# Patient Record
Sex: Female | Born: 1985 | Race: Black or African American | Hispanic: No | Marital: Married | Smoking: Never smoker
Health system: Southern US, Community
[De-identification: ages and names within clinical notes are randomized; demographics above are authoritative.]

## PROBLEM LIST (undated history)

## (undated) ENCOUNTER — Inpatient Hospital Stay (HOSPITAL_COMMUNITY): Payer: Self-pay

## (undated) DIAGNOSIS — D649 Anemia, unspecified: Secondary | ICD-10-CM

## (undated) DIAGNOSIS — R6889 Other general symptoms and signs: Secondary | ICD-10-CM

## (undated) DIAGNOSIS — R21 Rash and other nonspecific skin eruption: Secondary | ICD-10-CM

## (undated) DIAGNOSIS — R5383 Other fatigue: Secondary | ICD-10-CM

## (undated) DIAGNOSIS — R61 Generalized hyperhidrosis: Secondary | ICD-10-CM

## (undated) DIAGNOSIS — D509 Iron deficiency anemia, unspecified: Secondary | ICD-10-CM

## (undated) DIAGNOSIS — R5382 Chronic fatigue, unspecified: Secondary | ICD-10-CM

## (undated) DIAGNOSIS — I499 Cardiac arrhythmia, unspecified: Secondary | ICD-10-CM

## (undated) DIAGNOSIS — F329 Major depressive disorder, single episode, unspecified: Secondary | ICD-10-CM

## (undated) DIAGNOSIS — O1420 HELLP syndrome (HELLP), unspecified trimester: Secondary | ICD-10-CM

## (undated) DIAGNOSIS — F32A Depression, unspecified: Secondary | ICD-10-CM

## (undated) HISTORY — PX: WISDOM TOOTH EXTRACTION: SHX21

## (undated) HISTORY — DX: HELLP syndrome (HELLP), unspecified trimester: O14.20

## (undated) HISTORY — DX: Major depressive disorder, single episode, unspecified: F32.9

## (undated) HISTORY — DX: Depression, unspecified: F32.A

## (undated) HISTORY — DX: Anemia, unspecified: D64.9

---

## 2004-12-22 ENCOUNTER — Emergency Department (HOSPITAL_COMMUNITY): Admission: EM | Admit: 2004-12-22 | Discharge: 2004-12-22 | Payer: Self-pay | Admitting: Emergency Medicine

## 2005-03-18 ENCOUNTER — Emergency Department (HOSPITAL_COMMUNITY): Admission: EM | Admit: 2005-03-18 | Discharge: 2005-03-18 | Payer: Self-pay | Admitting: Emergency Medicine

## 2005-03-25 ENCOUNTER — Emergency Department (HOSPITAL_COMMUNITY): Admission: EM | Admit: 2005-03-25 | Discharge: 2005-03-25 | Payer: Self-pay | Admitting: Emergency Medicine

## 2005-03-30 ENCOUNTER — Emergency Department (HOSPITAL_COMMUNITY): Admission: EM | Admit: 2005-03-30 | Discharge: 2005-03-30 | Payer: Self-pay | Admitting: Emergency Medicine

## 2005-07-13 ENCOUNTER — Encounter: Admission: RE | Admit: 2005-07-13 | Discharge: 2005-07-13 | Payer: Self-pay | Admitting: Gastroenterology

## 2007-01-15 ENCOUNTER — Emergency Department (HOSPITAL_COMMUNITY): Admission: EM | Admit: 2007-01-15 | Discharge: 2007-01-15 | Payer: Self-pay | Admitting: *Deleted

## 2007-07-28 ENCOUNTER — Inpatient Hospital Stay (HOSPITAL_COMMUNITY): Admission: AD | Admit: 2007-07-28 | Discharge: 2007-07-28 | Payer: Self-pay | Admitting: Obstetrics & Gynecology

## 2007-07-28 ENCOUNTER — Ambulatory Visit (HOSPITAL_COMMUNITY): Admission: RE | Admit: 2007-07-28 | Discharge: 2007-07-28 | Payer: Self-pay | Admitting: Obstetrics & Gynecology

## 2007-07-30 ENCOUNTER — Inpatient Hospital Stay (HOSPITAL_COMMUNITY): Admission: AD | Admit: 2007-07-30 | Discharge: 2007-08-06 | Payer: Self-pay | Admitting: Obstetrics & Gynecology

## 2007-07-31 ENCOUNTER — Encounter: Payer: Self-pay | Admitting: Obstetrics & Gynecology

## 2007-08-01 ENCOUNTER — Encounter: Payer: Self-pay | Admitting: Obstetrics & Gynecology

## 2007-08-07 ENCOUNTER — Inpatient Hospital Stay (HOSPITAL_COMMUNITY): Admission: AD | Admit: 2007-08-07 | Discharge: 2007-08-09 | Payer: Self-pay | Admitting: Obstetrics & Gynecology

## 2007-08-14 ENCOUNTER — Emergency Department (HOSPITAL_COMMUNITY): Admission: EM | Admit: 2007-08-14 | Discharge: 2007-08-14 | Payer: Self-pay | Admitting: Emergency Medicine

## 2007-11-22 ENCOUNTER — Emergency Department (HOSPITAL_COMMUNITY): Admission: EM | Admit: 2007-11-22 | Discharge: 2007-11-22 | Payer: Self-pay | Admitting: Emergency Medicine

## 2008-01-17 ENCOUNTER — Emergency Department (HOSPITAL_COMMUNITY): Admission: EM | Admit: 2008-01-17 | Discharge: 2008-01-17 | Payer: Self-pay | Admitting: Emergency Medicine

## 2008-02-09 ENCOUNTER — Ambulatory Visit: Payer: Self-pay | Admitting: Family Medicine

## 2008-02-17 ENCOUNTER — Emergency Department (HOSPITAL_COMMUNITY): Admission: EM | Admit: 2008-02-17 | Discharge: 2008-02-17 | Payer: Self-pay | Admitting: Emergency Medicine

## 2008-03-08 ENCOUNTER — Other Ambulatory Visit: Admission: RE | Admit: 2008-03-08 | Discharge: 2008-03-08 | Payer: Self-pay | Admitting: Family Medicine

## 2008-03-08 ENCOUNTER — Ambulatory Visit: Payer: Self-pay | Admitting: Family Medicine

## 2008-03-08 ENCOUNTER — Encounter: Payer: Self-pay | Admitting: Family Medicine

## 2008-03-09 ENCOUNTER — Emergency Department (HOSPITAL_COMMUNITY): Admission: EM | Admit: 2008-03-09 | Discharge: 2008-03-09 | Payer: Self-pay | Admitting: Family Medicine

## 2008-03-11 ENCOUNTER — Emergency Department (HOSPITAL_COMMUNITY): Admission: EM | Admit: 2008-03-11 | Discharge: 2008-03-11 | Payer: Self-pay | Admitting: Emergency Medicine

## 2008-04-11 ENCOUNTER — Ambulatory Visit: Payer: Self-pay | Admitting: Family Medicine

## 2009-03-08 ENCOUNTER — Emergency Department (HOSPITAL_COMMUNITY): Admission: EM | Admit: 2009-03-08 | Discharge: 2009-03-08 | Payer: Self-pay | Admitting: Family Medicine

## 2009-07-19 ENCOUNTER — Emergency Department (HOSPITAL_COMMUNITY): Admission: EM | Admit: 2009-07-19 | Discharge: 2009-07-20 | Payer: Self-pay | Admitting: Emergency Medicine

## 2009-10-08 ENCOUNTER — Emergency Department (HOSPITAL_COMMUNITY): Admission: EM | Admit: 2009-10-08 | Discharge: 2009-10-08 | Payer: Self-pay | Admitting: Emergency Medicine

## 2010-02-09 ENCOUNTER — Emergency Department (HOSPITAL_COMMUNITY): Admission: EM | Admit: 2010-02-09 | Discharge: 2010-02-09 | Payer: Self-pay | Admitting: Emergency Medicine

## 2010-10-05 ENCOUNTER — Encounter: Payer: Self-pay | Admitting: Obstetrics & Gynecology

## 2010-12-01 LAB — URINALYSIS, ROUTINE W REFLEX MICROSCOPIC
Bilirubin Urine: NEGATIVE
Glucose, UA: NEGATIVE mg/dL
Hgb urine dipstick: NEGATIVE
Ketones, ur: NEGATIVE mg/dL
Nitrite: NEGATIVE
Protein, ur: NEGATIVE mg/dL
Specific Gravity, Urine: 1.024 (ref 1.005–1.030)
Urobilinogen, UA: 0.2 mg/dL (ref 0.0–1.0)
pH: 6 (ref 5.0–8.0)

## 2010-12-01 LAB — DIFFERENTIAL
Basophils Absolute: 0 10*3/uL (ref 0.0–0.1)
Basophils Relative: 0 % (ref 0–1)
Eosinophils Absolute: 0.1 10*3/uL (ref 0.0–0.7)
Eosinophils Relative: 1 % (ref 0–5)
Lymphocytes Relative: 26 % (ref 12–46)
Lymphs Abs: 1.5 10*3/uL (ref 0.7–4.0)
Monocytes Absolute: 0.6 10*3/uL (ref 0.1–1.0)
Monocytes Relative: 10 % (ref 3–12)
Neutro Abs: 3.7 10*3/uL (ref 1.7–7.7)
Neutrophils Relative %: 63 % (ref 43–77)

## 2010-12-01 LAB — COMPREHENSIVE METABOLIC PANEL
ALT: 14 U/L (ref 0–35)
AST: 16 U/L (ref 0–37)
Albumin: 3.9 g/dL (ref 3.5–5.2)
Alkaline Phosphatase: 57 U/L (ref 39–117)
BUN: 15 mg/dL (ref 6–23)
CO2: 27 mEq/L (ref 19–32)
Calcium: 9.4 mg/dL (ref 8.4–10.5)
Chloride: 107 mEq/L (ref 96–112)
Creatinine, Ser: 0.9 mg/dL (ref 0.4–1.2)
GFR calc Af Amer: >60 mL/min (ref >60)
GFR calc non Af Amer: >60 mL/min (ref >60)
Glucose, Bld: 92 mg/dL (ref 70–99)
Potassium: 4 mEq/L (ref 3.5–5.1)
Sodium: 140 mEq/L (ref 135–145)
Total Bilirubin: 0.5 mg/dL (ref 0.3–1.2)
Total Protein: 7.3 g/dL (ref 6.0–8.3)

## 2010-12-01 LAB — CBC
HCT: 35.7 % — ABNORMAL LOW (ref 36.0–46.0)
Hemoglobin: 11.8 g/dL — ABNORMAL LOW (ref 12.0–15.0)
MCHC: 33 g/dL (ref 30.0–36.0)
MCV: 86 fL (ref 78.0–100.0)
Platelets: 158 10*3/uL (ref 150–400)
RBC: 4.15 MIL/uL (ref 3.87–5.11)
RDW: 13.6 % (ref 11.5–15.5)
WBC: 5.9 10*3/uL (ref 4.0–10.5)

## 2010-12-01 LAB — LIPASE, BLOOD: Lipase: 26 U/L (ref 11–59)

## 2010-12-01 LAB — POCT PREGNANCY, URINE: Preg Test, Ur: NEGATIVE

## 2010-12-22 LAB — POCT URINALYSIS DIP (DEVICE)
Bilirubin Urine: NEGATIVE
Glucose, UA: NEGATIVE mg/dL
Hgb urine dipstick: NEGATIVE
Ketones, ur: NEGATIVE mg/dL
Nitrite: NEGATIVE
Protein, ur: NEGATIVE mg/dL
Specific Gravity, Urine: 1.025 (ref 1.005–1.030)
Urobilinogen, UA: 0.2 mg/dL (ref 0.0–1.0)
pH: 5.5 (ref 5.0–8.0)

## 2010-12-22 LAB — WET PREP, GENITAL
Clue Cells Wet Prep HPF POC: NONE SEEN
Trich, Wet Prep: NONE SEEN

## 2010-12-22 LAB — GC/CHLAMYDIA PROBE AMP, GENITAL
Chlamydia, DNA Probe: NEGATIVE
GC Probe Amp, Genital: NEGATIVE

## 2010-12-22 LAB — POCT PREGNANCY, URINE: Preg Test, Ur: NEGATIVE

## 2011-01-27 ENCOUNTER — Encounter: Payer: Self-pay | Admitting: Internal Medicine

## 2011-01-27 ENCOUNTER — Ambulatory Visit (INDEPENDENT_AMBULATORY_CARE_PROVIDER_SITE_OTHER): Payer: 59 | Admitting: Internal Medicine

## 2011-01-27 ENCOUNTER — Telehealth: Payer: Self-pay | Admitting: Gastroenterology

## 2011-01-27 VITALS — BP 108/76 | HR 60 | Ht 68.0 in | Wt 171.2 lb

## 2011-01-27 DIAGNOSIS — K5909 Other constipation: Secondary | ICD-10-CM

## 2011-01-27 DIAGNOSIS — K5900 Constipation, unspecified: Secondary | ICD-10-CM

## 2011-01-27 DIAGNOSIS — D649 Anemia, unspecified: Secondary | ICD-10-CM

## 2011-01-27 MED ORDER — FERROUS FUMARATE 325 (106 FE) MG PO TABS: 1.0000 | ORAL_TABLET | Freq: Every day | ORAL | Status: DC

## 2011-01-27 NOTE — Discharge Summary (Signed)
Cheryl [name], Cheryl [name]             ACCOUNT NO.:  [id number]   MEDICAL RECORD NO.:  [id number]          PATIENT TYPE:  INP   LOCATION:  9309                          [facility]ILITY:  [facility]   PHYSICIAN:  Charles A. 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  Much improved after bedrest, intravenous hydration and supportive  management.  Discharged home in good condition.   REASON FOR ADMISSION:  A 25 year old black female with history of HELLP  syndrome the previous week prior to her readmission and underwent  induction of labor at [redacted] weeks gestation, and delivered a nonviable  fetus.  The patient was discharged home on the Saturday prior to her  readmission.  On the day of her presentation, noted the onset of  increased dizziness and headache.  Headache was not relieved by Dilaudid  which she was prescribed.   PAST MEDICAL HISTORY/ILLNESSES:  None.   MEDICATIONS:  1. Prenatal vitamins.  2. Hydrochlorothiazide.  3. Dilaudid.   ALLERGIES:  PENICILLIN.   SOCIAL HISTORY:  Single.  Lives in a homeless-type accommodation at the  [facility].  Negative history of tobacco, alcohol or recreational  drug use.   FAMILY HISTORY:  Positive for coronary artery disease, diabetes,  hypertension and stroke.   PHYSICAL EXAMINATION:  GENERAL:  Well-nourished, well-developed female  in no acute distress.  VITAL SIGNS:  Temperature was 99.6, pulse 111, respiratory rate 20,  blood pressure initially was 142/101.  Repeat blood pressures were  136/85.  HEENT:  Within normal limits.  LUNGS:   Clear to auscultation bilaterally.  HEART:  Regular rate and rhythm.  ABDOMEN:  Nontender.  PELVIC:  Exam was omitted   IMPRESSION:  1. Status post HELLP syndrome, resolved.  2. New onset of headache and dizziness.   PLAN:  Admit for observation and supportive management.   HOSPITAL COURSE:  The patient was admitted and placed on bedrest, IV  fluid hydration and analgesic management.  She responded well to  therapy.  CT scan of the head was done on hospital day number 1 and  findings were negative for evidence of hemorrhage or any other  intracranial abnormalities.  A social work consult was obtained because  of the patient's home situation and discharge planning has arranged for  the patient's return to the [facility] and for social services  follow up.  Psychiatry consultation was also obtained by Dr. Jeanie Sewer  and the patient was started on Celexa 10 mg p.o. daily and Ambien 5-10  mg p.o. nightly for insomnia.  The patient will be followed up with one  of the psychiatric outpatient clinics that is available.  The patient  was ready for discharge on hospital day number 2  and follow up  instructions were given.   LABORATORY DATA:  Hemoglobin 11, hematocrit 32, white blood cell count  8600, platelets 183,000, SGOT 42, SGPT 86, LDH 299, uric acid was 6.3,  basic metabolic panel was all within normal limits.   DIAGNOSTICS:  CT scan of the head was negative.   FOLLOW UP:  The patient was given instructions for followup in the  office on August 10, 2007.      Charles A. Clearance Coots, M.D.  Electronically Signed     CAH/MEDQ  D:  08/09/2007  T:  08/09/2007  Job:  [id number]

## 2011-01-27 NOTE — Patient Instructions (Signed)
Take Miralax every day. Increase the fiber in your diet, High fiber diet info handout given. We will follow up with Urgent Care about your labs.

## 2011-01-27 NOTE — Telephone Encounter (Signed)
Patient informed. 

## 2011-01-27 NOTE — Assessment & Plan Note (Addendum)
This sounds like a functional disturbance. She is anemic but there no other worrisome features. She will try to increase fiber and use the MiraLax daily. I will have her follow up in 2 months just to make sure everything is okay and I will review the labs from urgent care. Hypothyroidism is possible but seems unlikely. TSH was normal 01/22/11 - at 1.591

## 2011-01-27 NOTE — Consult Note (Signed)
Cheryl [name], Cheryl [name] NO.:  [id number]   MEDICAL RECORD NO.:  [id number]         PATIENT TYPE:  WINP   LOCATION:                                [facility]  [facility]   PHYSICIAN:  Antonietta Breach, M.D.  DATE OF BIRTH:  28-Mar-1986   DATE OF CONSULTATION:  08/08/2007  DATE OF DISCHARGE:                                 CONSULTATION   REASON FOR CONSULTATION:  Depression.   REQUESTING PHYSICIAN:  Dr. Antionette Char.   HISTORY OF PRESENT ILLNESS:  [name] is a 25 year old female admitted  to the [facility] on November 23, after continuing to  have difficulty with HELLP syndrome.   The patient lost her 26-month-old baby 2 weeks ago due to HELLP  syndrome.   Since that time, the patient had been experiencing depressed mood, low  energy, anhedonia, difficulty concentrating and insomnia.  She states  that her appetite is adequate.  She is not having any thoughts of  harming herself or others.  She is not having any hallucinations or  delusion.   The patient has taken Ambien for the insomnia successfully.   PAST PSYCHIATRIC HISTORY:  The patient has no history of prior major  depression.  She does acknowledge that she has a history of excessive  worry.  She does have periodic feeling on edge, muscle tension.  She has  no history of prior psychotropic medication or mental health care.   FAMILY PSYCHIATRIC HISTORY:  None known.   SOCIAL HISTORY:  The patient is single.  She was working at [employer] third shift, but lost her job a few months ago when her car  broke down.  She does have a number of family members who live in the  area, however, she has not been living with any of them.  She does  described them as very supportive and part of her support system.  The  patient also describes a strong religious faith that gives her critical  support.  She does not use any alcohol or illegal drugs.   The father of the deceased baby has also  been grieving the loss of the  child, as the patient has; however, the father the baby also was  unfaithful to the patient when he was visiting family in [place].  The the patient broke off the serious tie with the father of the baby  after he was unfaithful.  She has wanted to talk about the whole of  their experience, including the death of the baby, however, the father  the baby has not wanted to talk about it.  He does want to stay in touch  with the patient, and they have been talking regularly.  As mentioned  above, at this point, the patient is classifying herself as homeless.  The social worker is helping with support on this case in order to help  the patient find her resources.   PAST MEDICAL HISTORY:  Please see above.  The patient is postpartum 2  weeks.   ALLERGIES:  PENICILLIN.   MEDICATIONS:  MAR is reviewed.  Psychotropics  include Ambien 10 mg  q.h.s. p.r.n.   LABORATORY DATA:  SGOT 42, SGPT 86.  WBC 8.6, hemoglobin 11.4, platelet  count 183.  Head CT without contrast showed no acute abnormalities.   REVIEW OF SYSTEMS:  CONSTITUTIONAL:  Afebrile.  No weight loss.  HEAD:  No trauma.  EYES:  No visual changes.  EARS:  No hearing impairment  NOSE:  No rhinorrhea.  MOUTH/THROAT:  No sore throat.  NEUROLOGIC:  No  focal motor or sensory changes.  PSYCHIATRIC:  As above.  CARDIOVASCULAR:  No chest pain, palpitations.  RESPIRATORY:  No coughing  or wheezing.  GASTROINTESTINAL:  No nausea, vomiting, diarrhea.  GENITOURINARY:  No dysuria.  SKIN:  Unremarkable.  ENDOCRINE/METABOLIC:  No heat or cold intolerance.  MUSCULOSKELETAL:  No deformities.  HEMATOLOGIC/LYMPHATIC:  Mild anemia.   EXAMINATION:  VITAL SIGNS:  Temperature 98.3, pulse 62.  Respiratory  rate 18, blood pressure 120/62, O2 saturation on room air 99%.  GENERAL  APPEARANCE:  [name] is a young female appearing her chronologic age,  partially reclined in a supine position in her hospital bed.  She has  no  abnormal involuntary movements.  OTHER MENTAL STATUS EXAM:  [name] is alert.  Her eye contact is  good.  Her attention span is within normal limits.  Her concentration is  mildly decreased.  She is oriented completely to all spheres.  Memory is  intact to immediate recent and remote.  Fund of knowledge and  intelligence are within normal limits.  Affect is constricted with  appropriate tears.  Mood is depressed.  Speech involves normal rate and  prosody.  The volume is mildly soft.  Thought process logical, coherent,  goal-directed.  No looseness of associations.  Language, expression and  comprehension are intact.  Abstraction intact.  Thought content, no  thoughts of harming herself, no thoughts of harming others, no  delusions, no hallucinations.  Insight is intact.  Judgment is intact.  The patient is socially appropriate and cooperative.   ASSESSMENT:  AXIS I:  293.83; mood disorder not otherwise specified, depressed (general  medical and functional elements).  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  296.23; major depressive disorder, single episode, severe.  AXIS II:  Deferred.  AXIS III:  See general medical section.  AXIS IV:  General medical, bereavement, economic, primary support group.  AXIS V:  55.   [name] is not at risk to harm herself or others.  She agrees to call  emergency services immediately for any thoughts of harming herself,  thoughts of harming others, or other psychiatric emergency symptoms.   The undersigned provided ego supportive psychotherapy and education.   The indications, alternatives and adverse effects of Celexa were  discussed with the patient for antidepression, antianxiety, as well as  Ambien for anti-insomnia.  The patient understands and would like to  proceed as below.   RECOMMENDATIONS:  1. Start Celexa 10 mg p.o. q. day, and then increase as tolerated to      20 mg p.o. q. day.  2. Ambien 5 mg to 10 mg p.o.  nightly p.r.n. insomnia/  3. Would ask the social worker to set this patient up with one of the      clinics of psychiatry attached to [facility] or      [facility].  The other option would be the county mental      health center.  The patient will require psychotropic medication  management, as well as psychotherapy.      Antonietta Breach, M.D.  Electronically Signed     JW/MEDQ  D:  08/08/2007  T:  08/09/2007  Job:  [id number]

## 2011-01-27 NOTE — Progress Notes (Signed)
  Subjective:    Patient ID: Cheryl [name], female    DOB: [birth year], 25 y.o.   MRN: [id number]  HPI 25-year-old [place] American woman with a one to two-year history of constipation. She describes an urge to defecate but an inability to movement. She has small balls of stool that once or twice a week with incomplete defecation. Her boyfriend's mom is a Engineer, civil (consulting) and told her about MiraLax and when she took it every day she had good results and much less bloating and abdominal distention. She stopped that because the label indicated that it was not for chronic use. She is using that intermittently at this time. She has been to urgent care for evaluation and recently had labs that urgent medical and family care and was told she was anemic but other labs were okay. We are requesting these results. She was told to use iron but she notes that that constipates her when she takes it.  There is no rectal bleeding, no unexplained weight loss, or change in caliber of the stools. She did not have problems like this in childhood though she had an abdominal pain workup with negative MRI, ultrasound and upper GI small bowel follow-through in the past several years.    Review of Systems Positive for fatigue, says her menstrual periods are regular but not heavy. She has some insomnia, allergies and some anxiety issues. All other systems are negative or as above per history of present illness.    Objective:   Physical Exam  Constitutional: She is oriented to person, place, and time. She appears well-developed and well-nourished.  HENT:  Head: Normocephalic and atraumatic.  Mouth/Throat: Oropharynx is clear and moist.  Eyes: Conjunctivae are normal. Pupils are equal, round, and reactive to light.  Neck: Normal range of motion. Neck supple. No JVD present. No thyromegaly present.  Cardiovascular: Normal rate, regular rhythm and normal heart sounds.  Exam reveals no gallop and no friction rub.   No murmur  heard. Pulmonary/Chest: Effort normal and breath sounds normal.  Abdominal: Soft. Bowel sounds are normal. She exhibits no distension. There is tenderness. There is no rebound and no guarding.       Mild left lower quadrant tenderness without mass  Genitourinary:       Rectal exam with female staff present shows a normal anoderm. In a week is present. Digital exam shows normal resting tone. There is a good voluntary squeeze. There is some decrease in the center of the rectum with Valsalva, there is good abdominal pressure with that. No stool in the rectal vault. No masses.  Musculoskeletal: She exhibits no edema.  Lymphadenopathy:    She has no cervical adenopathy.  Neurological: She is alert and oriented to person, place, and time.  Skin: Skin is warm and dry.  Psychiatric: She has a normal mood and affect.          Assessment & Plan:

## 2011-01-27 NOTE — Assessment & Plan Note (Addendum)
Do not have the lab data that at this time. We'll review that and make further recommendations. She was told to use iron but this constipates her. She could need an anemia panel to understand better. Possible causes include menstrual blood loss, poor iron intake, B12 deficiency, much less likely would be gastrointestinal blood loss is there's no history of that. She could need Hemoccult testing to understand but the overall situation is consistent with a benign constipation issue Labs subsequently arrived to review, date of labs is 01/22/2011. Her hemoglobin was 11.1 with MCV 84.5. RDW 15.4. Iron saturation 8% with TIBC 360, which is in the middle of the normal range. White blood cells and platelets normal. We will call with these results, suspect she does have a mild iron deficiency, and some sort of iron supplementation would be appropriate but that has caused constipation in the past. Will suggest hemocyte or some other type of less constipating agent.

## 2011-01-27 NOTE — Telephone Encounter (Signed)
Message copied by Tedra Senegal on Tue Jan 27, 2011  4:17 PM ------      Message from: Stan Head      Created: Tue Jan 27, 2011  3:56 PM      Regarding: start hemocyte       Please call patient and tell her I reviewed labs from urgent care.      Agree that she needs some iron and I want her to try Hemocyte (ferrous fumarate). I placed it on her med list.      It is OTC so she should take one a day. Hopefully, it will not constipate her.

## 2011-01-30 NOTE — Discharge Summary (Signed)
Cheryl [name], Cheryl [name]             ACCOUNT NO.:  [id number]   MEDICAL RECORD NO.:  [id number]          PATIENT TYPE:  INP   LOCATION:  9318                          [facility]   PHYSICIAN:  Roseanna Rainbow, M.D.DATE OF BIRTH:  1986/01/05   DATE OF ADMISSION:  07/30/2007  DATE OF DISCHARGE:  08/06/2007                               DISCHARGE SUMMARY   CHIEF COMPLAINT:  The patient is a 25 year old with an intrauterine  pregnancy at 20+ weeks complaining of abdominal pain.   HISTORY OF PRESENT ILLNESS:  The patient gives a history of several days  of right upper quadrant and mid epigastric pain.  The pain is  characterized as dull, unassociated with meals and constant.  She has  had some nausea and vomiting.  She denies any history of similar  episodes.  No other symptoms, exposures or fevers.  There was initial  relief of the pain with Maalox.  She presented to the [facility] with similar complaints several days ago.  The  pain improved there.  The pain is improved by sitting forward.  She  denies any history of gastrointestinal pathology, i.e. peptic ulcer  disease, cholelithiasis.  She reports recent Tylenol usage with  adherence to appropriate dosing.   PAST MEDICAL HISTORY:  She denies.   PAST OB/GYN HISTORY:  Noncontributory.  Spontaneous abortion x 1.   PAST SURGICAL HISTORY:  Denies.   SOCIAL HISTORY:  No tobacco, ethanol or drug use.   ALLERGIES:  PENICILLIN.   MEDICATIONS:  Please see the reconciliation form.   PHYSICAL EXAMINATION:  VITAL SIGNS:  Stable, afebrile.  Blood pressure  130-140s/80s, fetal heart rate by Doppler okay.  The O2 sats 100% on  room air.  GENERAL:  Moderate distress, diaphoretic.  LUNGS:  Clear to auscultation bilaterally.  HEART:  Regular rate and rhythm.  ABDOMEN:  There is tenderness in the right upper quadrant.  No rebound  or guarding.  Nondistended.  Normoactive bowel sounds.  GU:  Sterile  vaginal exam per the R.N.  The cervix is long, closed and  posterior.   LABORATORY DATA:  Lipase 16, amylase 96, SGOT and SGPT 57 and 50, white  blood cell count 9.8, hemoglobin 12.4, platelets 103,000.   DIAGNOSTICS:  1. EKG:  Sinus bradycardia.  2. Abdominal ultrasound:  Normal.   ASSESSMENT:  Second trimester intrauterine pregnancy with epigastric  pain.  Differential diagnoses, gastroesophageal reflux disease,  dyspepsia, H. pylori, gastritis, viral hepatitis,  mild-moderate thrombopenia, questionable preeclampsia with elevated  blood pressures in the setting of pain.   PLAN:  Observation.  Proton pump inhibitor, Carafate.  Serial labs and  exams.   HOSPITAL COURSE:  The patient was admitted on July 31, 2007.  The  SGOT and SGPT were 148 and 109.  Uric acid was 4.7.  LDH was 522,  hemoglobin was 12.4 and platelets were 58,000.  At this point, it was  felt that she had developed HELLP syndrome.  A DIC panel and peripheral  smear were ordered as well as a manual platelet count, type and hold.  Maternal fetal  medicine consult was ordered as well as a 24-hour urine  for protein and creatinine.  Later that day, her platelet count was  30,000.  MRI of the abdomen demonstrated small ascites.  At this point,  the maternal fetal medicine specialist, Dr. Rachel Bo recommended delivery  for maternal indications.  It was reviewed with the patient that the  gestational age was in the pre-viabile realm. The risk of bleeding at  the time of delivery with the possibility of a blood transfusion was  also discussed.  At this point, high-dose Cytotec was administered  vaginally.  At approximately 5 a.m. on August 01, 2007 there was  delivery of the fetus and placenta.  Her labs at approximately midnight,  the LDH was 741, SGOT and SGPT were 304 and 195.  Hemoglobin was 12.1  and a platelet count was 25,000.  Her bleeding at this point was stable.  Please note that the patient had been started  on magnesium sulfate for  seizure prophylaxis.  On August 02, 2007, the liver transaminases were  trending downward.  However, her platelet count was 15,000.  Her blood  pressures remained stable.   At this point, a thrombophilia workup was performed.  On August 03, 2007, her platelet count was 27,000 and her liver transaminases  continued to trend down.  On August 04, 2007, hydrochlorothiazide was  started.  Her platelet count at this point was 36,000.  On August 05, 2007, her platelet count was 90,000.  The patient was complaining of  redness on the palms of her hands and soles of her feet, and  paresthesias.  Maternal fetal medicine was re-consulted and the etiology  of these complaints was unclear.  Hepatitis panel was negative.  The  prothrombin mutation was negative.  Protein C and S were normal.  Lupus  anticoagulant was negative.  Antithrombin III was normal.  Beta-2  microglobulin was normal.  Factor V Leiden was negative as well.  The  magnesium sulfate was discontinued.  She remained stable and was  discharged to home on August 06, 2007   DISCHARGE DIAGNOSIS:  Intrauterine pregnancy at 20 weeks with hemolysis,  elevated liver enzymes and low platelet count syndrome.   PROCEDURE:  Cytotec induction of labor.   CONDITION:  Stable.   DIET:  Regular.   ACTIVITY:  Pelvic rest, progressive activity.   MEDICATIONS:  Hydrochlorothiazide.   DISPOSITION:  The patient was to follow up in 2 days for blood pressure  check.     Roseanna Rainbow, M.D.  Electronically Signed    LAJ/MEDQ  D:  09/01/2007  T:  09/01/2007  Job:  [id number]

## 2011-02-10 ENCOUNTER — Encounter: Payer: Self-pay | Admitting: Internal Medicine

## 2011-02-25 ENCOUNTER — Telehealth: Payer: Self-pay | Admitting: Internal Medicine

## 2011-02-25 NOTE — Telephone Encounter (Signed)
Patient called about instructions given on 01/27/11. Patient was told that she need to be on iron supplement but the OTC medications constipate her and the pharmacist stated it may be better to get Rx Hemocyte. Patient question if she could get rx sent to [facility].

## 2011-02-27 ENCOUNTER — Emergency Department (HOSPITAL_COMMUNITY)
Admission: EM | Admit: 2011-02-27 | Discharge: 2011-02-27 | Disposition: A | Discharge status: Home / Self Care | Payer: No Typology Code available for payment source | Attending: Emergency Medicine | Admitting: Emergency Medicine

## 2011-02-27 ENCOUNTER — Emergency Department (HOSPITAL_COMMUNITY): Payer: No Typology Code available for payment source

## 2011-02-27 DIAGNOSIS — M542 Cervicalgia: Secondary | ICD-10-CM | POA: Insufficient documentation

## 2011-02-27 DIAGNOSIS — S139XXA Sprain of joints and ligaments of unspecified parts of neck, initial encounter: Secondary | ICD-10-CM | POA: Insufficient documentation

## 2011-02-27 DIAGNOSIS — M545 Low back pain, unspecified: Secondary | ICD-10-CM | POA: Insufficient documentation

## 2011-02-27 DIAGNOSIS — S335XXA Sprain of ligaments of lumbar spine, initial encounter: Secondary | ICD-10-CM | POA: Insufficient documentation

## 2011-02-27 MED ORDER — HEMOCYTE PLUS 106-1 MG PO CAPS: 1.0000 | ORAL_CAPSULE | Freq: Every day | ORAL | Status: DC

## 2011-02-27 NOTE — Telephone Encounter (Signed)
I prescribed Hemocyte - see if she is better with that Unless already done she needs a cbc in 6 weeks re iron-deficiency anemia

## 2011-03-02 ENCOUNTER — Emergency Department (HOSPITAL_COMMUNITY)
Admission: EM | Admit: 2011-03-02 | Discharge: 2011-03-02 | Disposition: A | Discharge status: Home / Self Care | Payer: No Typology Code available for payment source | Attending: Emergency Medicine | Admitting: Emergency Medicine

## 2011-03-02 DIAGNOSIS — S139XXA Sprain of joints and ligaments of unspecified parts of neck, initial encounter: Secondary | ICD-10-CM | POA: Insufficient documentation

## 2011-03-02 DIAGNOSIS — R51 Headache: Secondary | ICD-10-CM | POA: Insufficient documentation

## 2011-03-02 NOTE — Telephone Encounter (Signed)
Tried calling patient Friday 02/27/11, patient cell number will not receive incoming call. Also tried calling today 03/02/11, patient's cell number still not receiving incoming calls. Called patients work number and they don't have employee by that name. Called front desk to let them know that patients contact information needs to be updated.

## 2011-03-07 ENCOUNTER — Emergency Department (HOSPITAL_COMMUNITY)
Admission: EM | Admit: 2011-03-07 | Discharge: 2011-03-07 | Disposition: A | Discharge status: Home / Self Care | Payer: Self-pay | Attending: Emergency Medicine | Admitting: Emergency Medicine

## 2011-03-07 DIAGNOSIS — B3731 Acute candidiasis of vulva and vagina: Secondary | ICD-10-CM | POA: Insufficient documentation

## 2011-03-07 DIAGNOSIS — B373 Candidiasis of vulva and vagina: Secondary | ICD-10-CM | POA: Insufficient documentation

## 2011-03-07 LAB — WET PREP, GENITAL

## 2011-03-07 LAB — URINE MICROSCOPIC-ADD ON

## 2011-03-07 LAB — URINALYSIS, ROUTINE W REFLEX MICROSCOPIC
Nitrite: NEGATIVE
Specific Gravity, Urine: 1.027 (ref 1.005–1.030)
pH: 6 (ref 5.0–8.0)

## 2011-03-10 LAB — GC/CHLAMYDIA PROBE AMP, GENITAL: Chlamydia, DNA Probe: NEGATIVE

## 2011-06-08 LAB — URINALYSIS, ROUTINE W REFLEX MICROSCOPIC
Glucose, UA: NEGATIVE
Ketones, ur: NEGATIVE
Nitrite: NEGATIVE
Protein, ur: NEGATIVE
Urobilinogen, UA: 0.2

## 2011-06-08 LAB — POCT PREGNANCY, URINE
Operator id: 196461
Preg Test, Ur: NEGATIVE

## 2011-06-08 LAB — WET PREP, GENITAL: Yeast Wet Prep HPF POC: NONE SEEN

## 2011-06-08 LAB — GC/CHLAMYDIA PROBE AMP, GENITAL: GC Probe Amp, Genital: POSITIVE — AB

## 2011-06-10 ENCOUNTER — Emergency Department (HOSPITAL_COMMUNITY)
Admission: EM | Admit: 2011-06-10 | Discharge: 2011-06-11 | Disposition: A | Discharge status: Home / Self Care | Payer: Self-pay | Attending: Emergency Medicine | Admitting: Emergency Medicine

## 2011-06-10 DIAGNOSIS — J039 Acute tonsillitis, unspecified: Secondary | ICD-10-CM | POA: Insufficient documentation

## 2011-06-10 DIAGNOSIS — H6690 Otitis media, unspecified, unspecified ear: Secondary | ICD-10-CM | POA: Insufficient documentation

## 2011-06-10 DIAGNOSIS — H10029 Other mucopurulent conjunctivitis, unspecified eye: Secondary | ICD-10-CM | POA: Insufficient documentation

## 2011-06-11 LAB — COMPREHENSIVE METABOLIC PANEL
ALT: 9
Albumin: 3.8
Alkaline Phosphatase: 54
Chloride: 106
Glucose, Bld: 95
Potassium: 4.1
Sodium: 136
Total Bilirubin: 1
Total Protein: 7.3

## 2011-06-11 LAB — URINALYSIS, ROUTINE W REFLEX MICROSCOPIC
Bilirubin Urine: NEGATIVE
Glucose, UA: NEGATIVE
Hgb urine dipstick: NEGATIVE
Ketones, ur: NEGATIVE
pH: 6.5

## 2011-06-11 LAB — STOOL CULTURE

## 2011-06-11 LAB — CLOSTRIDIUM DIFFICILE EIA: C difficile Toxins A+B, EIA: NEGATIVE

## 2011-06-11 LAB — CBC
Hemoglobin: 12.6
RDW: 13.3
WBC: 5.4

## 2011-06-11 LAB — POCT RAPID STREP A: Streptococcus, Group A Screen (Direct): NEGATIVE

## 2011-06-11 LAB — DIFFERENTIAL
Basophils Relative: 0
Eosinophils Absolute: 0.1
Monocytes Absolute: 0.6
Monocytes Relative: 11

## 2011-06-11 LAB — OVA AND PARASITE EXAMINATION: Ova and parasites: NONE SEEN

## 2011-06-14 ENCOUNTER — Emergency Department (HOSPITAL_COMMUNITY)
Admission: EM | Admit: 2011-06-14 | Discharge: 2011-06-14 | Disposition: A | Discharge status: Home / Self Care | Payer: Self-pay | Attending: Emergency Medicine | Admitting: Emergency Medicine

## 2011-06-14 DIAGNOSIS — R6889 Other general symptoms and signs: Secondary | ICD-10-CM | POA: Insufficient documentation

## 2011-06-14 DIAGNOSIS — R51 Headache: Secondary | ICD-10-CM | POA: Insufficient documentation

## 2011-06-23 LAB — COMPREHENSIVE METABOLIC PANEL
ALT: 101 — ABNORMAL HIGH
ALT: 109 — ABNORMAL HIGH
ALT: 135 — ABNORMAL HIGH
ALT: 97 — ABNORMAL HIGH
AST: 160 — ABNORMAL HIGH
AST: 42 — ABNORMAL HIGH
AST: 56 — ABNORMAL HIGH
AST: 57 — ABNORMAL HIGH
AST: 94 — ABNORMAL HIGH
Albumin: 2.1 — ABNORMAL LOW
Albumin: 2.2 — ABNORMAL LOW
Albumin: 2.3 — ABNORMAL LOW
Albumin: 2.6 — ABNORMAL LOW
Albumin: 3.5
Alkaline Phosphatase: 54
Alkaline Phosphatase: 57
Alkaline Phosphatase: 62
Alkaline Phosphatase: 65
Alkaline Phosphatase: 65
Alkaline Phosphatase: 76
BUN: 1 — ABNORMAL LOW
BUN: 2 — ABNORMAL LOW
BUN: 3 — ABNORMAL LOW
BUN: 4 — ABNORMAL LOW
BUN: 5 — ABNORMAL LOW
BUN: 6
BUN: 9
CO2: 24
CO2: 25
CO2: 25
CO2: 27
CO2: 27
CO2: 27
Calcium: 6.6 — ABNORMAL LOW
Calcium: 6.9 — ABNORMAL LOW
Calcium: 6.9 — ABNORMAL LOW
Calcium: 7.1 — ABNORMAL LOW
Calcium: 7.3 — ABNORMAL LOW
Chloride: 102
Chloride: 104
Chloride: 105
Chloride: 105
Chloride: 106
Chloride: 107
Chloride: 107
Creatinine, Ser: 0.73
Creatinine, Ser: 0.74
Creatinine, Ser: 0.74
Creatinine, Ser: 0.77
Creatinine, Ser: 0.78
Creatinine, Ser: 0.78
Creatinine, Ser: 0.82
GFR calc Af Amer: >60
GFR calc Af Amer: >60
GFR calc Af Amer: >60
GFR calc Af Amer: >60
GFR calc Af Amer: >60
GFR calc Af Amer: >60
GFR calc Af Amer: >60
GFR calc non Af Amer: >60
GFR calc non Af Amer: >60
GFR calc non Af Amer: >60
GFR calc non Af Amer: >60
GFR calc non Af Amer: >60
GFR calc non Af Amer: >60
GFR calc non Af Amer: >60
Glucose, Bld: 101 — ABNORMAL HIGH
Glucose, Bld: 101 — ABNORMAL HIGH
Glucose, Bld: 77
Glucose, Bld: 82
Potassium: 3.5
Potassium: 3.5
Potassium: 3.6
Potassium: 3.6
Potassium: 3.7
Potassium: 3.9
Sodium: 137
Sodium: 137
Sodium: 138
Total Bilirubin: 0.8
Total Bilirubin: 0.8
Total Bilirubin: 0.8
Total Bilirubin: 0.9
Total Bilirubin: 1
Total Bilirubin: 1.2
Total Bilirubin: 1.4 — ABNORMAL HIGH
Total Protein: 4.8 — ABNORMAL LOW
Total Protein: 5.2 — ABNORMAL LOW
Total Protein: 5.8 — ABNORMAL LOW
Total Protein: 6.4
Total Protein: 7.4

## 2011-06-23 LAB — DIC (DISSEMINATED INTRAVASCULAR COAGULATION)PANEL
INR: 1.1
Platelets: 51 — ABNORMAL LOW
Smear Review: NONE SEEN

## 2011-06-23 LAB — CBC
HCT: 26.2 — ABNORMAL LOW
HCT: 26.3 — ABNORMAL LOW
HCT: 28 — ABNORMAL LOW
HCT: 28.9 — ABNORMAL LOW
HCT: 29.1 — ABNORMAL LOW
HCT: 30.4 — ABNORMAL LOW
HCT: 32.4 — ABNORMAL LOW
HCT: 33.2 — ABNORMAL LOW
HCT: 35.5 — ABNORMAL LOW
HCT: 35.5 — ABNORMAL LOW
Hemoglobin: 11.5 — ABNORMAL LOW
Hemoglobin: 12.1
Hemoglobin: 12.1
Hemoglobin: 12.4
Hemoglobin: 8.9 — ABNORMAL LOW
Hemoglobin: 9.1 — ABNORMAL LOW
MCHC: 34.6
MCHC: 34.8
MCHC: 34.8
MCHC: 34.9
MCHC: 35.1
MCHC: 35.2
MCHC: 35.3
MCV: 87.7
MCV: 88.4
MCV: 89.5
MCV: 89.6
MCV: 89.8
MCV: 89.9
MCV: 89.9
MCV: 90.3
MCV: 90.4
MCV: 90.6
Platelets: 15 — CL
Platelets: 183
Platelets: 19 — CL
Platelets: 19 — CL
Platelets: 63 — ABNORMAL LOW
Platelets: 83 — ABNORMAL LOW
Platelets: 90 — ABNORMAL LOW
RBC: 2.91 — ABNORMAL LOW
RBC: 2.93 — ABNORMAL LOW
RBC: 3.3 — ABNORMAL LOW
RBC: 3.44 — ABNORMAL LOW
RBC: 3.69 — ABNORMAL LOW
RBC: 3.9
RBC: 3.96
RBC: 3.97
RDW: 14.2
RDW: 14.3
RDW: 14.4
RDW: 14.5
RDW: 15.1
RDW: 15.2
RDW: 15.3
RDW: 15.5
RDW: 15.7 — ABNORMAL HIGH
WBC: 10.3
WBC: 6.7
WBC: 7.4
WBC: 7.6
WBC: 7.7
WBC: 8.6
WBC: 8.6
WBC: 9.1
WBC: 9.8

## 2011-06-23 LAB — URINE MICROSCOPIC-ADD ON

## 2011-06-23 LAB — PROTEIN, URINE, 24 HOUR
Protein, 24H Urine: 652 — ABNORMAL HIGH
Protein, Urine: 16
Urine Total Volume-UPROT: 4075

## 2011-06-23 LAB — URINALYSIS, ROUTINE W REFLEX MICROSCOPIC
Bilirubin Urine: NEGATIVE
Glucose, UA: 100 — AB
Glucose, UA: NEGATIVE
Ketones, ur: 15 — AB
Leukocytes, UA: NEGATIVE
Nitrite: NEGATIVE
Nitrite: NEGATIVE
Nitrite: NEGATIVE
Specific Gravity, Urine: 1.02
Specific Gravity, Urine: 1.02
Urobilinogen, UA: 0.2
pH: 6
pH: 7
pH: 6

## 2011-06-23 LAB — LACTATE DEHYDROGENASE
LDH: 345 — ABNORMAL HIGH
LDH: 407 — ABNORMAL HIGH
LDH: 554 — ABNORMAL HIGH
LDH: 700 — ABNORMAL HIGH

## 2011-06-23 LAB — CREATININE, SERUM
Creatinine, Ser: 0.71
Creatinine, Ser: 0.72
Creatinine, Ser: 0.87
Creatinine, Ser: 0.87
GFR calc Af Amer: >60
GFR calc non Af Amer: >60

## 2011-06-23 LAB — TYPE AND SCREEN
ABO/RH(D): O POS
Antibody Screen: NEGATIVE

## 2011-06-23 LAB — ALT
ALT: 184 — ABNORMAL HIGH
ALT: 195 — ABNORMAL HIGH
ALT: 92 — ABNORMAL HIGH

## 2011-06-23 LAB — HEPATITIS PANEL, ACUTE
Hep B C IgM: NEGATIVE
Hepatitis B Surface Ag: NEGATIVE

## 2011-06-23 LAB — SAMPLE TO BLOOD BANK

## 2011-06-23 LAB — PROTHROMBIN GENE MUTATION

## 2011-06-23 LAB — URINE CULTURE: Special Requests: NEGATIVE

## 2011-06-23 LAB — URIC ACID
Uric Acid, Serum: 4.7
Uric Acid, Serum: 4.8
Uric Acid, Serum: 5.4
Uric Acid, Serum: 5.4
Uric Acid, Serum: 5.6
Uric Acid, Serum: 6.3

## 2011-06-23 LAB — PROTEIN C, TOTAL: Protein C, Total: 53 % — ABNORMAL LOW (ref 70–140)

## 2011-06-23 LAB — PROTEIN S, TOTAL: Protein S Ag, Total: 61 % — ABNORMAL LOW (ref 70–140)

## 2011-06-23 LAB — RAPID URINE DRUG SCREEN, HOSP PERFORMED
Amphetamines: NOT DETECTED
Cocaine: NOT DETECTED
Opiates: NOT DETECTED
Tetrahydrocannabinol: NOT DETECTED

## 2011-06-23 LAB — PLATELET COUNT: Platelets: 51 — ABNORMAL LOW

## 2011-06-23 LAB — ANA: Anti Nuclear Antibody(ANA): NEGATIVE

## 2011-06-23 LAB — BETA 2 MICROGLOBULIN, SERUM: Beta-2 Microglobulin: 1.34

## 2011-06-23 LAB — MAGNESIUM
Magnesium: 4.8 — ABNORMAL HIGH
Magnesium: 4.9 — ABNORMAL HIGH
Magnesium: 4.9 — ABNORMAL HIGH

## 2011-06-23 LAB — LUPUS ANTICOAGULANT PANEL
PTT Lupus Anticoagulant: 57.3 — ABNORMAL HIGH (ref 36.3–48.8)
PTTLA Confirmation: 1.9 (ref <8.0)

## 2011-06-23 LAB — CARDIOLIPIN ANTIBODIES, IGM+IGG: Anticardiolipin IgG: <7 — ABNORMAL LOW (ref <11)

## 2011-06-23 LAB — PROTEIN S ACTIVITY: Protein S Activity: 41 % — ABNORMAL LOW (ref 69–129)

## 2011-06-23 LAB — AST: AST: 267 — ABNORMAL HIGH

## 2011-06-23 LAB — H. PYLORI ANTIBODY, IGG: H Pylori IgG: 0.5

## 2011-06-23 LAB — RPR: RPR Ser Ql: NONREACTIVE

## 2011-06-23 LAB — FACTOR 5 LEIDEN

## 2011-06-24 ENCOUNTER — Encounter: Payer: Self-pay | Admitting: Gastroenterology

## 2011-06-24 NOTE — Telephone Encounter (Signed)
error 

## 2011-06-24 NOTE — Telephone Encounter (Signed)
Message copied by Bernita Buffy on Wed Jun 24, 2011 10:06 AM ------      Message from: Myan Suit, [place] A      Created: Mon Mar 02, 2011  1:32 PM       Patient needs a CBC in 6 week re: iron. ? Updated contact information.

## 2012-03-28 ENCOUNTER — Other Ambulatory Visit: Payer: Self-pay

## 2012-04-15 ENCOUNTER — Encounter: Payer: Self-pay | Admitting: Internal Medicine

## 2012-05-25 ENCOUNTER — Emergency Department (HOSPITAL_COMMUNITY)
Admission: EM | Admit: 2012-05-25 | Discharge: 2012-05-26 | Disposition: A | Discharge status: Home / Self Care | Payer: Self-pay | Attending: Emergency Medicine | Admitting: Emergency Medicine

## 2012-05-25 ENCOUNTER — Encounter (HOSPITAL_COMMUNITY): Payer: Self-pay | Admitting: *Deleted

## 2012-05-25 DIAGNOSIS — R110 Nausea: Secondary | ICD-10-CM | POA: Insufficient documentation

## 2012-05-25 DIAGNOSIS — D649 Anemia, unspecified: Secondary | ICD-10-CM | POA: Insufficient documentation

## 2012-05-25 DIAGNOSIS — R51 Headache: Secondary | ICD-10-CM | POA: Insufficient documentation

## 2012-05-25 LAB — URINALYSIS, ROUTINE W REFLEX MICROSCOPIC
Bilirubin Urine: NEGATIVE
Hgb urine dipstick: NEGATIVE
Ketones, ur: NEGATIVE mg/dL
Nitrite: NEGATIVE
Protein, ur: NEGATIVE mg/dL
Specific Gravity, Urine: 1.015 (ref 1.005–1.030)
Urobilinogen, UA: 0.2 mg/dL (ref 0.0–1.0)

## 2012-05-25 MED ORDER — ONDANSETRON 8 MG PO TBDP
8.0000 mg | ORAL_TABLET | Freq: Once | ORAL | Status: AC
  Administered 2012-05-25: 8 mg via ORAL
  Filled 2012-05-25: qty 1

## 2012-05-25 MED ORDER — ONDANSETRON 8 MG PO TBDP: 8.0000 mg | ORAL_TABLET | Freq: Three times a day (TID) | ORAL | Status: DC | PRN

## 2012-05-25 NOTE — ED Provider Notes (Signed)
History     CSN: [id number]  Arrival date & time 05/25/12  1953   First MD Initiated Contact with Patient 05/25/12 2214      Chief Complaint  Patient presents with  . Nausea  . Headache    (Consider location/radiation/quality/duration/timing/severity/associated sxs/prior treatment) HPI Comments: Patient presents with several days of nausea and mild dull generalized headache. Headache is not associated with photophobia or phonophobia. She states she saw an urgent care doctor several days ago was told that she could have possibly had a "stomach virus" and was prescribed Flagyl. Patient had a reaction to the Flagyl so they prescribed her Keflex. Patient denies head injury or vision change. She's not having a fever, vomiting, abdominal pain, chest pain, shortness of breath. She is concerned that she may be pregnant however she states that she's had a negative pregnancy test. Onset was was gradual. Course is constant. Nothing makes symptoms better or worse.  The history is provided by the patient.    Past Medical History  Diagnosis Date  . Anemia   . Asthma   . Hypertension in pregnancy     History reviewed. No pertinent past surgical history.  Family History  Problem Relation Age of Onset  . Cancer Maternal Grandmother     Bladder  . Diabetes Maternal Grandfather   . Diabetes Maternal Grandmother     History  Substance Use Topics  . Smoking status: Never Smoker   . Smokeless tobacco: Never Used  . Alcohol Use: Yes     Rarely    OB History    Grav Para Term Preterm Abortions TAB SAB Ect Mult Living                  Review of Systems  Constitutional: Negative for fever.  HENT: Negative for sore throat and rhinorrhea.   Eyes: Negative for redness.  Respiratory: Negative for cough.   Cardiovascular: Negative for chest pain.  Gastrointestinal: Positive for nausea. Negative for vomiting, abdominal pain and diarrhea.  Genitourinary: Negative for dysuria.    Musculoskeletal: Negative for myalgias.  Skin: Negative for rash.  Neurological: Positive for headaches.    Allergies  Flagyl; Latex; and Penicillins  Home Medications   Current Outpatient Rx  Name Route Sig Dispense Refill  . CEPHALEXIN 500 MG PO CAPS Oral Take 500 mg by mouth 2 (two) times daily.    Marland Kitchen POLYETHYLENE GLYCOL 3350 PO PACK Oral Take 17 g by mouth daily.      Marland Kitchen PROBIOTIC PO Oral Take 3 capsules by mouth daily.        BP 127/57  Pulse 54  Temp 98.2 F (36.8 C) (Oral)  Resp 18  SpO2 100%  LMP 05/15/2012  Physical Exam  Nursing note and vitals reviewed. Constitutional: She is oriented to person, place, and time. She appears well-developed and well-nourished.  HENT:  Head: Normocephalic and atraumatic.  Eyes: Conjunctivae normal are normal.  Neck: Normal range of motion. Neck supple.  Cardiovascular: Normal rate and regular rhythm.   No murmur heard. Pulmonary/Chest: Effort normal and breath sounds normal. No respiratory distress.  Neurological: She is alert and oriented to person, place, and time. She has normal strength. No cranial nerve deficit or sensory deficit. Coordination and gait normal. GCS eye subscore is 4. GCS verbal subscore is 5. GCS motor subscore is 6.  Skin: Skin is warm and dry.  Psychiatric: She has a normal mood and affect.    ED Course  Procedures (including critical care time)  Labs Reviewed  URINALYSIS, ROUTINE W REFLEX MICROSCOPIC  PREGNANCY, URINE  LAB REPORT - SCANNED   No results found.   1. Nausea   2. Headache     10:38 PM Patient seen and examined. Medications ordered.  Awaiting urine preg results.    Vital signs reviewed and are as follows: Filed Vitals:   05/25/12 2127  BP: 127/57  Pulse: 54  Temp: 98.2 F (36.8 C)  Resp: 18   UA/upreg neg. Pt informed. She feels better after zofran. Will d/c home. Urged PCP follow-up. HA precautions given.    MDM  Pt with HA and nausea for better part of a week. HA is  dull, mild. No thunderclap, not worst of life, no head injury. Neuro exam nml. She appears well, non-toxic. She is eating [employer] in exam room.         [place], [place] 05/27/12 [phone]

## 2012-05-25 NOTE — ED Notes (Signed)
Pt reports nausea and HA that began last Thursday - nausea has been constant w/o vomiting - HA described as dull generalized HA, denies hx of migraines or HA. Pt states there is a possibility she may be pregnant, took a home pregnancy test and resulted as negative. Pt was [facility] at [facility] on Monday for same and was told she possibly has "stomach bug" and given antibiotics. Pt denies any fever or diarrhea as well. LNMP 05/15/2012

## 2012-05-26 NOTE — ED Notes (Signed)
Discharge instructions reviewed w/ pt., verbalizes understanding. One prescription proivded at discharge

## 2012-05-30 NOTE — ED Provider Notes (Signed)
Medical screening examination/treatment/procedure(s) were performed by non-physician practitioner and as supervising physician I was immediately available for consultation/collaboration.  Juliet Rude. Rubin Payor, MD 05/30/12 1536

## 2012-11-18 ENCOUNTER — Emergency Department (HOSPITAL_COMMUNITY)
Admission: EM | Admit: 2012-11-18 | Discharge: 2012-11-18 | Disposition: A | Discharge status: Home / Self Care | Attending: Emergency Medicine | Admitting: Emergency Medicine

## 2012-11-18 ENCOUNTER — Emergency Department (HOSPITAL_COMMUNITY)

## 2012-11-18 ENCOUNTER — Encounter (HOSPITAL_COMMUNITY): Payer: Self-pay | Admitting: *Deleted

## 2012-11-18 DIAGNOSIS — Z8742 Personal history of other diseases of the female genital tract: Secondary | ICD-10-CM | POA: Insufficient documentation

## 2012-11-18 DIAGNOSIS — N938 Other specified abnormal uterine and vaginal bleeding: Secondary | ICD-10-CM | POA: Insufficient documentation

## 2012-11-18 DIAGNOSIS — N939 Abnormal uterine and vaginal bleeding, unspecified: Secondary | ICD-10-CM

## 2012-11-18 DIAGNOSIS — Z3202 Encounter for pregnancy test, result negative: Secondary | ICD-10-CM | POA: Insufficient documentation

## 2012-11-18 DIAGNOSIS — Z8709 Personal history of other diseases of the respiratory system: Secondary | ICD-10-CM | POA: Insufficient documentation

## 2012-11-18 DIAGNOSIS — Z862 Personal history of diseases of the blood and blood-forming organs and certain disorders involving the immune mechanism: Secondary | ICD-10-CM | POA: Insufficient documentation

## 2012-11-18 DIAGNOSIS — N949 Unspecified condition associated with female genital organs and menstrual cycle: Secondary | ICD-10-CM | POA: Insufficient documentation

## 2012-11-18 LAB — CBC
HCT: 36.7 % (ref 36.0–46.0)
MCV: 87 fL (ref 78.0–100.0)
RBC: 4.22 MIL/uL (ref 3.87–5.11)
WBC: 5.5 10*3/uL (ref 4.0–10.5)

## 2012-11-18 LAB — URINE MICROSCOPIC-ADD ON

## 2012-11-18 LAB — POCT I-STAT, CHEM 8
BUN: 10 mg/dL (ref 6–23)
Creatinine, Ser: 0.9 mg/dL (ref 0.50–1.10)
Glucose, Bld: 87 mg/dL (ref 70–99)
Hemoglobin: 12.9 g/dL (ref 12.0–15.0)
Potassium: 3.6 mEq/L (ref 3.5–5.1)
Sodium: 141 mEq/L (ref 135–145)

## 2012-11-18 LAB — WET PREP, GENITAL: Yeast Wet Prep HPF POC: NONE SEEN

## 2012-11-18 LAB — URINALYSIS, ROUTINE W REFLEX MICROSCOPIC
Bilirubin Urine: NEGATIVE
Nitrite: NEGATIVE
Specific Gravity, Urine: 1.027 (ref 1.005–1.030)
pH: 7 (ref 5.0–8.0)

## 2012-11-18 LAB — OB RESULTS CONSOLE GC/CHLAMYDIA
Chlamydia: NEGATIVE
GC PROBE AMP, GENITAL: NEGATIVE

## 2012-11-18 NOTE — ED Provider Notes (Signed)
History     CSN: [id number]  Arrival date & time 11/18/12  1147   First MD Initiated Contact with Patient 11/18/12 1159      Chief Complaint  Patient presents with  . Abdominal Pain    (Consider location/radiation/quality/duration/timing/severity/associated sxs/prior treatment) HPI  27 year old [id number] female presents to the emergency department with chief complaint of abdominal pain and vaginal bleeding.  Patient's last menstrual period was 10/15/2012.  She had confirmed pregnancy test on 11/11/2012.  Patient states that last night she noticed some brown and bloody discharge from her vagina.  This morning when she awoke she had bilateral lower abdominal pain and cramping consistent with period cramps.  She has had increased bleeding from the vagina.  The patient states that she had one previous stillbirth at 6 months.  He states that her pain is mild and she did not need any pain medications at this time.  Denies fevers, chills, myalgias, arthralgias. Denies DOE, SOB, chest tightness or pressure, radiation to left arm, jaw or back, or diaphoresis. Denies dysuria, flank pain,  frequency, urgency, or hematuria. Denies headaches, light headedness, weakness, visual disturbances. Denies  nausea, vomiting, diarrhea or constipation.    Past Medical History  Diagnosis Date  . Anemia   . Asthma   . Hypertension in pregnancy     History reviewed. No pertinent past surgical history.  Family History  Problem Relation Age of Onset  . Cancer Maternal Grandmother     Bladder  . Diabetes Maternal Grandfather   . Diabetes Maternal Grandmother     History  Substance Use Topics  . Smoking status: Never Smoker   . Smokeless tobacco: Never Used  . Alcohol Use: Yes     Comment: Rarely    OB History   Grav Para Term Preterm Abortions TAB SAB Ect Mult Living                  Review of Systems Ten systems reviewed and are negative for acute change, except as noted in the HPI.    Allergies  Flagyl; Latex; and Penicillins  Home Medications   Current Outpatient Rx  Name  Route  Sig  Dispense  Refill  . prenatal vitamin w/FE, FA (PRENATAL 1 + 1) 27-1 MG TABS   Oral   Take 1 tablet by mouth daily at 12 noon.         . Probiotic Product (PROBIOTIC PO)   Oral   Take 3 capsules by mouth daily.             BP 126/67  Pulse 76  Temp(Src) 97.8 F (36.6 C) (Oral)  Resp 16  SpO2 100%  LMP 10/17/2012  Physical Exam  Physical Exam  Nursing note and vitals reviewed. Constitutional: She is oriented to person, place, and time. She appears well-developed and well-nourished. No distress.  HENT:  Head: Normocephalic and atraumatic.  Eyes: Conjunctivae normal and EOM are normal. Pupils are equal, round, and reactive to light. No scleral icterus.  Neck: Normal range of motion.  Cardiovascular: Normal rate, regular rhythm and normal heart sounds.  Exam reveals no gallop and no friction rub.   No murmur heard. Pulmonary/Chest: Effort normal and breath sounds normal. No respiratory distress.  Abdominal: Soft. Bowel sounds are normal. She exhibits no distension and no mass. There is no tenderness. There is no guarding.  Neurological: She is alert and oriented to person, place, and time.  Skin: Skin is warm and dry. She is not diaphoretic.  Pelvic exam: VULVA: normal appearing vulva with no masses, tenderness or lesions, VAGINA: normal appearing vagina with normal color and discharge, no lesions, CERVIX: DNA probe for chlamydia and GC obtained, Open os with bloody discharge,  NO CMT or Adnexal tenderness.   ED Course  Procedures (including critical care time)  Labs Reviewed  WET PREP, GENITAL - Abnormal; Notable for the following:    Clue Cells Wet Prep HPF POC RARE (*)    WBC, Wet Prep HPF POC RARE (*)    All other components within normal limits  URINALYSIS, ROUTINE W REFLEX MICROSCOPIC - Abnormal; Notable for the following:    Hgb urine dipstick LARGE (*)     Ketones, ur TRACE (*)    All other components within normal limits  GC/CHLAMYDIA PROBE AMP  CBC  URINE MICROSCOPIC-ADD ON  HCG, QUANTITATIVE, PREGNANCY  POCT PREGNANCY, URINE  POCT I-STAT, CHEM 8  ABO/RH   No results found.   No diagnosis found.    MDM  1:02 PM BP 126/67  Pulse 76  Temp(Src) 97.8 F (36.6 C) (Oral)  Resp 16  SpO2 100%  LMP 10/17/2012 Patient with complaint of bleeding, abdominal pain and positive home pregnancy.  She appears to have inevitable abortion.  Labs are pending, will obtain ultrasound to rule out ectopic pregnancy.  Patient resting comfortably.     ,2:44 PM Pateint HCG quant < 10 (reuslt of 4) indicating no pregnancy.  Patient May have hada miscarrige.  No sign of IUP.  Will have the patient follow up with her gyn within 1 week.  At this time there does not appear to be any evidence of an acute emergency medical condition and the patient appears stable for discharge with appropriate outpatient follow up.Diagnosis was discussed with patient who verbalizes understanding and is agreeable to discharge. Pt case discussed with Dr. Fonnie Jarvis who agrees with my plan.    Arthor Captain, PA-C 11/18/12 1446

## 2012-11-18 NOTE — ED Notes (Signed)
Phlebotomist at bedside to collect labs.

## 2012-11-18 NOTE — ED Notes (Signed)
Patient transported to Ultrasound 

## 2012-11-18 NOTE — ED Notes (Signed)
Pt from home with reports of positive pregnancy test on 2/28 but began having abdominal cramping with spotting last night as well as lighter bleeding that started this morning. Pt endorses hx of miscarriage at 6 months gestation in 2008. Pt denies N/V/D, fever or heavy vaginal bleeding. Pt reports that it is time for her normal menstrual cycle.

## 2012-11-19 NOTE — ED Provider Notes (Signed)
Medical screening examination/treatment/procedure(s) were performed by non-physician practitioner and as supervising physician I was immediately available for consultation/collaboration.  Hurman Horn, MD 11/19/12 1322

## 2012-11-20 LAB — GC/CHLAMYDIA PROBE AMP
CT Probe RNA: NEGATIVE
GC Probe RNA: NEGATIVE

## 2012-12-20 ENCOUNTER — Encounter: Payer: Self-pay | Admitting: Obstetrics

## 2013-02-07 ENCOUNTER — Emergency Department (HOSPITAL_COMMUNITY)
Admission: EM | Admit: 2013-02-07 | Discharge: 2013-02-07 | Disposition: A | Discharge status: Home / Self Care | Attending: Emergency Medicine | Admitting: Emergency Medicine

## 2013-02-07 ENCOUNTER — Encounter (HOSPITAL_COMMUNITY): Payer: Self-pay | Admitting: *Deleted

## 2013-02-07 DIAGNOSIS — T169XXA Foreign body in ear, unspecified ear, initial encounter: Secondary | ICD-10-CM | POA: Insufficient documentation

## 2013-02-07 DIAGNOSIS — T161XXA Foreign body in right ear, initial encounter: Secondary | ICD-10-CM

## 2013-02-07 DIAGNOSIS — H9209 Otalgia, unspecified ear: Secondary | ICD-10-CM | POA: Insufficient documentation

## 2013-02-07 DIAGNOSIS — Y929 Unspecified place or not applicable: Secondary | ICD-10-CM | POA: Insufficient documentation

## 2013-02-07 DIAGNOSIS — J45909 Unspecified asthma, uncomplicated: Secondary | ICD-10-CM | POA: Insufficient documentation

## 2013-02-07 DIAGNOSIS — Z880 Allergy status to penicillin: Secondary | ICD-10-CM | POA: Insufficient documentation

## 2013-02-07 DIAGNOSIS — IMO0002 Reserved for concepts with insufficient information to code with codable children: Secondary | ICD-10-CM | POA: Insufficient documentation

## 2013-02-07 DIAGNOSIS — Y9389 Activity, other specified: Secondary | ICD-10-CM | POA: Insufficient documentation

## 2013-02-07 DIAGNOSIS — Z91040 Latex allergy status: Secondary | ICD-10-CM | POA: Insufficient documentation

## 2013-02-07 DIAGNOSIS — Z862 Personal history of diseases of the blood and blood-forming organs and certain disorders involving the immune mechanism: Secondary | ICD-10-CM | POA: Insufficient documentation

## 2013-02-07 MED ORDER — CIPROFLOXACIN-DEXAMETHASONE 0.3-0.1 % OT SUSP: 4.0000 [drp] | Freq: Two times a day (BID) | OTIC | Status: DC

## 2013-02-07 NOTE — ED Provider Notes (Signed)
History     CSN: [id number]  Arrival date & time 02/07/13  4098   First MD Initiated Contact with Patient 02/07/13 [phone]      Chief Complaint  Patient presents with  . Foreign Body in Ear    HPI  history provided by the patient. The patient is a 27 year old female who presents with complaints of right ear pain and irritation. Patient awoke this morning and states that she felt an irritation and something moving inside her right ear. There is slight pain and discomfort with this. She states she can hear loud noises and movements close to the ear from something crawling. Denies any hearing loss. Denies any recent nasal congestion or other symptoms. She has not use any treatments to try to help symptoms. No other aggravating or alleviating factors. No other associated symptoms.     Past Medical History  Diagnosis Date  . Anemia   . Asthma   . Hypertension in pregnancy     History reviewed. No pertinent past surgical history.  Family History  Problem Relation Age of Onset  . Cancer Maternal Grandmother     Bladder  . Diabetes Maternal Grandfather   . Diabetes Maternal Grandmother     History  Substance Use Topics  . Smoking status: Never Smoker   . Smokeless tobacco: Never Used  . Alcohol Use: Yes     Comment: Rarely    OB History   Grav Para Term Preterm Abortions TAB SAB Ect Mult Living                  Review of Systems  Constitutional: Negative for fever, chills and diaphoresis.  HENT: Positive for ear pain. Negative for hearing loss, congestion and rhinorrhea.   All other systems reviewed and are negative.    Allergies  Flagyl; Latex; and Penicillins  Home Medications   Current Outpatient Rx  Name  Route  Sig  Dispense  Refill  . Multiple Vitamins-Minerals (WOMENS ONE DAILY PO)   Oral   Take 1 capsule by mouth daily.         . ciprofloxacin-dexamethasone (CIPRODEX) otic suspension   Right Ear   Place 4 drops into the right ear 2 (two) times  daily.   7.5 mL   0     BP 123/95  Pulse 88  Temp(Src) 98 F (36.7 C) (Oral)  Resp 18  SpO2 100%  LMP 01/18/2013  Physical Exam  Nursing note and vitals reviewed. Constitutional: She is oriented to person, place, and time. She appears well-developed and well-nourished. No distress.  HENT:  Head: Normocephalic.  Ears:  Small black insect moving near the right inferior TM  Neck: Normal range of motion. Neck supple.  Cardiovascular: Normal rate and regular rhythm.   Pulmonary/Chest: Effort normal and breath sounds normal.  Abdominal: Soft.  Musculoskeletal: Normal range of motion.  Neurological: She is alert and oriented to person, place, and time.  Skin: Skin is warm and dry. No rash noted.  Psychiatric: She has a normal mood and affect. Her behavior is normal.    ED Course  FOREIGN BODY REMOVAL Date/Time: 02/07/2013 5:40 AM Performed by: Angus Seller Authorized by: Angus Seller Consent: Verbal consent obtained. Risks and benefits: risks, benefits and alternatives were discussed Consent given by: patient Patient understanding: patient states understanding of the procedure being performed Patient consent: the patient's understanding of the procedure matches consent given Patient identity confirmed: verbally with patient Time out: Immediately prior to procedure a "  time out" was called to verify the correct patient, procedure, equipment, support staff and site/side marked as required. Body area: ear Location details: right ear Localization method: visualized Removal mechanism: irrigation Complexity: simple 1 objects recovered. Objects recovered: Small black insect Post-procedure assessment: foreign body removed Patient tolerance: Patient tolerated the procedure well with no immediate complications. Comments: There was a very small amount of bleeding and irritation near the TM where the insect was stuck. No signs of obvious perforation        1. Foreign body  in ear, right, initial encounter       MDM  Patient seen and evaluated. Patient appears uncomfortable but no acute distress.        Angus Seller, PA-C 02/07/13 2052

## 2013-02-07 NOTE — ED Notes (Signed)
Pt states that she woke up this morning and feels like something is in her rt ear; pt states that she could feel something crawling around and hears noise in her ear

## 2013-02-07 NOTE — ED Provider Notes (Signed)
Medical screening examination/treatment/procedure(s) were performed by non-physician practitioner and as supervising physician I was immediately available for consultation/collaboration.  Flint Melter, MD 02/07/13 2148

## 2013-03-21 ENCOUNTER — Encounter: Payer: Self-pay | Admitting: *Deleted

## 2013-03-21 ENCOUNTER — Other Ambulatory Visit (INDEPENDENT_AMBULATORY_CARE_PROVIDER_SITE_OTHER)

## 2013-03-21 VITALS — BP 131/85 | HR 85 | Temp 98.5°F | Ht 67.0 in | Wt 205.0 lb

## 2013-03-21 DIAGNOSIS — Z3200 Encounter for pregnancy test, result unknown: Secondary | ICD-10-CM

## 2013-03-21 LAB — POCT URINE PREGNANCY: Preg Test, Ur: POSITIVE

## 2013-03-21 NOTE — Progress Notes (Unsigned)
Pt in office today for a pregnancy confirmation. Pt states she had two positive home pregnancy test this morning. Pt states her last pregnancy she delivered a stillborn at about 6 months.

## 2013-03-31 ENCOUNTER — Encounter: Payer: Self-pay | Admitting: *Deleted

## 2013-04-03 ENCOUNTER — Encounter: Payer: Self-pay | Admitting: Obstetrics & Gynecology

## 2013-04-10 ENCOUNTER — Encounter: Payer: Self-pay | Admitting: Obstetrics & Gynecology

## 2013-04-10 ENCOUNTER — Ambulatory Visit (INDEPENDENT_AMBULATORY_CARE_PROVIDER_SITE_OTHER): Admitting: Obstetrics & Gynecology

## 2013-04-10 VITALS — BP 124/84 | Temp 98.3°F | Wt 100.2 lb

## 2013-04-10 DIAGNOSIS — Z3401 Encounter for supervision of normal first pregnancy, first trimester: Secondary | ICD-10-CM

## 2013-04-10 DIAGNOSIS — O09219 Supervision of pregnancy with history of pre-term labor, unspecified trimester: Secondary | ICD-10-CM

## 2013-04-10 DIAGNOSIS — O09212 Supervision of pregnancy with history of pre-term labor, second trimester: Secondary | ICD-10-CM | POA: Insufficient documentation

## 2013-04-10 DIAGNOSIS — O0990 Supervision of high risk pregnancy, unspecified, unspecified trimester: Secondary | ICD-10-CM | POA: Insufficient documentation

## 2013-04-10 DIAGNOSIS — Z3201 Encounter for pregnancy test, result positive: Secondary | ICD-10-CM

## 2013-04-10 LAB — POCT URINALYSIS DIPSTICK
Bilirubin, UA: NEGATIVE
Ketones, UA: NEGATIVE
Leukocytes, UA: NEGATIVE
Spec Grav, UA: <=1.005
pH, UA: 7

## 2013-04-10 NOTE — Progress Notes (Signed)
Cardiac activity on an informal U/S.  CRL [redacted]w[redacted]d

## 2013-04-10 NOTE — Progress Notes (Signed)
Pulse- 68 . Subjective:    Cheryl [name] is being seen today for her first obstetrical visit.  This is a planned pregnancy. She is at Unknown gestation. 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 Relationship with FOB: spouse, living together. Patient does intend to breast feed. Pregnancy history fully reviewed.  Menstrual History: OB History   Grav Para Term Preterm Abortions TAB SAB Ect Mult Living   3 1  1 1  1          Menarche age: 75 Patient's last menstrual period was 02/17/2013.    The following portions of the patient's history were reviewed and updated as appropriate: allergies, current medications, past family history, past medical history, past social history, past surgical history and problem list.  Review of Systems Pertinent items are noted in HPI.    Objective:   General Appearance:    Alert, cooperative, no distress, appears stated age  Head:    Normocephalic, without obvious abnormality, atraumatic  Eyes:    PERRL, conjunctiva/corneas clear, EOM's intact, fundi    benign, both eyes  Ears:    Normal TM's and external ear canals, both ears  Nose:   Nares normal, septum midline, mucosa normal, no drainage    or sinus tenderness  Throat:   Lips, mucosa, and tongue normal; teeth and gums normal  Neck:   Supple, symmetrical, trachea midline, no adenopathy;    thyroid:  no enlargement/tenderness/nodules; no carotid   bruit or JVD  Back:     Symmetric, no curvature, ROM normal, no CVA tenderness  Lungs:     Clear to auscultation bilaterally, respirations unlabored  Chest Wall:    No tenderness or deformity   Heart:    Regular rate and rhythm, S1 and S2 normal, no murmur, rub   or gallop  Breast Exam:    No tenderness, masses, or nipple abnormality  Abdomen:     Soft, non-tender, bowel sounds active all four quadrants,    no masses, no organomegaly  Genitalia:    Normal female without lesion, discharge or tenderness  Extremities:    Extremities normal, atraumatic, no cyanosis or edema  Pulses:   2+ and symmetric all extremities  Skin:   Skin color, texture, turgor normal, no rashes or lesions  Lymph nodes:   Cervical, supraclavicular, and axillary nodes normal  Neurologic:   CNII-XII intact, normal strength, sensation and reflexes    throughout     Assessment:    Pregnancy at [redacted]w[redacted]d   Plan:    Initial labs drawn. Prenatal vitamins.  Counseling provided regarding continued use of seat belts, cessation of alcohol consumption, smoking or use of illicit drugs; infection precautions i.e., influenza/TDAP immunizations, toxoplasmosis,CMV, parvovirus, listeria and varicella; workplace safety, exercise during pregnancy; routine dental care, safe medications, sexual activity, hot tubs, saunas, pools, travel, caffeine use, fish and methlymercury, potential toxins, hair treatments, varicose veins Weight gain recommendations reviewed: underweight/BMI< 18.5--> gain 28 - 40 lbs; normal weight/BMI 18.5 - 24.9--> gain 25 - 35 lbs; overweight/BMI 25 - 29.9--> gain 15 - 25 lbs; obese/BMI >30->gain  11 - 20 lbs Problem list reviewed and updated. U/S for dating Referral--> MFM Role of ultrasound in pregnancy discussed; fetal survey Amniocentesis discussed: not indicated. Follow up in 4 weeks. 50% of 20 min visit spent on counseling and coordination of care.

## 2013-04-10 NOTE — Patient Instructions (Signed)
Prenatal Care  WHAT IS PRENATAL CARE?  Prenatal care means health care during your pregnancy, before your baby is born. Take care of yourself and your baby by:   Getting early prenatal care. If you know you are pregnant, or think you might be pregnant, call your caregiver as soon as possible. Schedule a visit for a general/prenatal examination.  Getting regular prenatal care. Follow your caregiver's schedule for blood and other necessary tests. Do not miss appointments.  Do everything you can to keep yourself and your baby healthy during your pregnancy.  Prenatal care should include evaluation of medical, dietary, educational, psychological, and social needs for the couple and the medical, surgical, and genetic history of the family of the mother and father.  Discuss with your caregiver:  Your medicines, prescription, over-the-counter, and herbal medicines.  Substance abuse, alcohol, smoking, and illegal drugs.  Domestic abuse and violence, if present.  Your immunizations.  Nutrition and diet.  Exercising.  Environment and occupational hazards, at home and at work.  History of sexually transmitted disease, both you and your partner.  Previous pregnancies. WHY IS PRENATAL CARE SO IMPORTANT?  By seeing you regularly, your caregiver has the chance to find problems early, so that they can be treated as soon as possible. Other problems might be prevented. Many studies have shown that early and regular prenatal care is important for the health of both mothers and their babies.  I AM THINKING ABOUT GETTING PREGNANT. HOW CAN I TAKE CARE OF MYSELF?  Taking care of yourself before you get pregnant helps you to have a healthy pregnancy. It also lowers your chances of having a baby born with a birth defect. Here are ways to take care of yourself before you get pregnant:   Eat healthy foods, exercise regularly (30 minutes per day for most days of the week is best), and get enough rest and  sleep. Talk to your caregiver about what kinds of foods and exercises are best for you.  Take 400 micrograms (mcg) of folic acid (one of the B vitamins) every day. The best way to do this is to take a daily multivitamin pill that contains this amount of folic acid. Getting enough of the synthetic (manufactured) form of folic acid every day before you get pregnant and during early pregnancy can help prevent certain birth defects. Many breakfast cereals and other grain products have folic acid added to them, but only certain cereals contain 400 mcg of folic acid per serving. Check the label on your multivitamin or cereal to find the amount of folic acid in the food.  See your caregiver for a complete check up before getting pregnant. Make sure that you have had all your immunization shots, especially for rubella (German measles). Rubella can cause serious birth defects. Chickenpox is another illness you want to avoid during pregnancy. If you have had chickenpox and rubella in the past, you should be immune to them.  Tell your caregiver about any prescription or non-prescription medicines (including herbal remedies) you are taking. Some medicines are not safe to take during pregnancy.  Stop smoking cigarettes, drinking alcohol, or taking illegal drugs. Ask your caregiver for help, if you need it. You can also get help with alcohol and drugs by talking with a member of your faith community, a counselor, or a trusted friend.  Discuss and treat any medical, social, or psychological problems before getting pregnant.  Discuss any history of genetic problems in the mother, father, and their families. Do   genetic testing before getting pregnant, when possible.  Discuss any physical or emotional abuse with your caregiver.  Discuss with your caregiver if you might be exposed to harmful chemicals on your job or where you live.  Discuss with your caregiver if you think your job or the hours you work may be  harmful and should be changed.  The father should be involved with the decision making and with all aspects of the pregnancy, labor, and delivery.  If you have medical insurance, make sure you are covered for pregnancy. I JUST FOUND OUT THAT I AM PREGNANT. HOW CAN I TAKE CARE OF MYSELF?  Here are ways to take care of yourself and the precious new life growing inside you:   Continue taking your multivitamin with 400 micrograms (mcg) of folic acid every day.  Get early and regular prenatal care. It does not matter if this is your first pregnancy or if you already have children. It is very important to see a caregiver during your pregnancy. Your caregiver will check at each visit to make sure that you and the baby are healthy. If there are any problems, action can be taken right away to help you and the baby.  Eat a healthy diet that includes:  Fruits.  Vegetables.  Foods low in saturated fat.  Grains.  Calcium-rich foods.  Drink 6 to 8 glasses of liquids a day.  Unless your caregiver tells you not to, try to be physically active for 30 minutes, most days of the week. If you are pressed for time, you can get your activity in through 10 minute segments, three times a day.  If you smoke, drink alcohol, or use drugs, STOP. These can cause long-term damage to your baby. Talk with your caregiver about steps to take to stop smoking. Talk with a member of your faith community, a counselor, a trusted friend, or your caregiver if you are concerned about your alcohol or drug use.  Ask your caregiver before taking any medicine, even over-the-counter medicines. Some medicines are not safe to take during pregnancy.  Get plenty of rest and sleep.  Avoid hot tubs and saunas during pregnancy.  Do not have X-rays taken, unless absolutely necessary and with the recommendation of your caregiver. A lead shield can be placed on your abdomen, to protect the baby when X-rays are taken in other parts of the  body.  Do not empty the cat litter when you are pregnant. It may contain a parasite that causes an infection called toxoplasmosis, which can cause birth defects. Also, use gloves when working in garden areas used by cats.  Do not eat uncooked or undercooked cheese, meats, or fish.  Stay away from toxic chemicals like:  Insecticides.  Solvents (some cleaners or paint thinners).  Lead.  Mercury.  Sexual relations may continue until the end of the pregnancy, unless you have a medical problem or there is a problem with the pregnancy and your caregiver tells you not to.  Do not wear high heel shoes, especially during the second half of the pregnancy. You can lose your balance and fall.  Do not take long trips, unless absolutely necessary. Be sure to see your caregiver before going on the trip.  Do not sit in one position for more than 2 hours, when on a trip.  Take a copy of your medical records when going on a trip.  Know where there is a hospital in the city you are visiting, in case of an   emergency.  Most dangerous household products will have pregnancy warnings on their labels. Ask your caregiver about products if you are unsure.  Limit or eliminate your caffeine intake from coffee, tea, sodas, medicines, and chocolate.  Many women continue working through pregnancy. Staying active might help you stay healthier. If you have a question about the safety or the hours you work at your particular job, talk with your caregiver.  Get informed:  Read books.  Watch videos.  Go to childbirth classes for you and the father.  Talk with experienced moms.  Ask your caregiver about childbirth education classes for you and your partner. Classes can help you and your partner prepare for the birth of your baby.  Ask about a pediatrician (baby doctor) and methods and pain medicine for labor, delivery, and possible Cesarean delivery (C-section). I AM NOT THINKING ABOUT GETTING PREGNANT  RIGHT NOW, BUT HEARD THAT ALL WOMEN SHOULD TAKE FOLIC ACID EVERY DAY?  All women of childbearing age, with even a remote chance of getting pregnant, should try to make sure they get enough folic acid. Many pregnancies are not planned. Many women do not know they are actually pregnant early in their pregnancies, and certain birth defects happen in the very early part of pregnancy. Taking 400 micrograms (mcg) of folic acid every day will help prevent certain birth defects that happen in the early part of pregnancy. If a woman begins taking vitamin pills in the second or third month of pregnancy, it may be too late to prevent birth defects. Folic acid may also have other health benefits for women, besides preventing birth defects.  HOW OFTEN SHOULD I SEE MY CAREGIVER DURING PREGNANCY?  Your caregiver will give you a schedule for your prenatal visits. You will have visits more often as you get closer to the end of your pregnancy. An average pregnancy lasts about 40 weeks.  A typical schedule includes visiting your caregiver:   About once each month, during your first 6 months of pregnancy.  Every 2 weeks, during the next 2 months.  Weekly in the last month, until the delivery date. Your caregiver will probably want to see you more often if:  You are over 35.  Your pregnancy is high risk, because you have certain health problems or problems with the pregnancy, such as:  Diabetes.  High blood pressure.  The baby is not growing on schedule, according to the dates of the pregnancy. Your caregiver will do special tests, to make sure you and the baby are not having any serious problems. WHAT HAPPENS DURING PRENATAL VISITS?   At your first prenatal visit, your caregiver will talk to you about you and your partner's health history and your family's health history, and will do a physical exam.  On your first visit, a physical exam will include checks of your blood pressure, height and weight, and an  exam of your pelvic organs. Your caregiver will do a Pap test if you have not had one recently, and will do cultures of your cervix to make sure there is no infection.  At each visit, there will be tests of your blood, urine, blood pressure, weight, and checking the progress of the baby.  Your caregiver will be able to tell you when to expect that your baby will be born.  Each visit is also a chance for you to learn about staying healthy during pregnancy and for asking questions.  Discuss whether you will be breastfeeding.  At your later prenatal   visits, your caregiver will check how you are doing and how the baby is developing. You may have a number of tests done as your pregnancy progresses.  Ultrasound exams are often used to check on the baby's growth and health.  You may have more urine and blood tests, as well as special tests, if needed. These may include amniocentesis (examine fluid in the pregnancy sac), stress tests (check how baby responds to contractions), biophysical profile (measures fetus well-being). Your caregiver will explain the tests and why they are necessary. I AM IN MY LATE THIRTIES, AND I WANT TO HAVE A CHILD NOW. SHOULD I DO ANYTHING SPECIAL?  As you get older, there is more chance of having a medical problem (high blood pressure), pregnancy problem (preeclampsia, problems with the placenta), miscarriage, or a baby born with a birth defect. However, most women in their late thirties and early forties have healthy babies. See your caregiver on a regular basis before you get pregnant and be sure to go for exams throughout your pregnancy. Your caregiver probably will want to do some special tests to check on you and your baby's health when you are pregnant.  Women today are often delaying having children until later in life, when they are in their thirties and forties. While many women in their thirties and forties have no difficulty getting pregnant, fertility does decline  with age. For women over 40 who cannot get pregnant after 6 months of trying, it is recommended that they see their caregiver for a fertility evaluation. It is not uncommon to have trouble becoming pregnant or experience infertility (inability to become pregnant after trying for one year). If you think that you or your partner may be infertile, you can discuss this with your caregiver. He or she can recommend treatments such as drugs, surgery, or assisted reproductive technology.  Document Released: 09/03/2003 Document Revised: 11/23/2011 Document Reviewed: 07/31/2009 [employer] Patient Information 2014 [employer].  

## 2013-04-11 ENCOUNTER — Other Ambulatory Visit: Payer: Self-pay | Admitting: *Deleted

## 2013-04-11 DIAGNOSIS — O0990 Supervision of high risk pregnancy, unspecified, unspecified trimester: Secondary | ICD-10-CM

## 2013-04-11 LAB — OBSTETRIC PANEL
Antibody Screen: NEGATIVE
Basophils Absolute: 0 10*3/uL (ref 0.0–0.1)
Basophils Relative: 0 % (ref 0–1)
HCT: 38.9 % (ref 36.0–46.0)
Hepatitis B Surface Ag: NEGATIVE
Lymphocytes Relative: 26 % (ref 12–46)
MCHC: 33.2 g/dL (ref 30.0–36.0)
Monocytes Absolute: 0.6 10*3/uL (ref 0.1–1.0)
Neutro Abs: 4.1 10*3/uL (ref 1.7–7.7)
Neutrophils Relative %: 63 % (ref 43–77)
Platelets: 158 10*3/uL (ref 150–400)
RDW: 13.9 % (ref 11.5–15.5)
Rubella: 3.65 Index — ABNORMAL HIGH (ref <0.90)
WBC: 6.5 10*3/uL (ref 4.0–10.5)

## 2013-04-11 LAB — LUPUS ANTICOAGULANT PANEL
DRVVT: 48.2 secs — ABNORMAL HIGH (ref <42.9)
Lupus Anticoagulant: NOT DETECTED

## 2013-04-11 LAB — PAP IG, CT-NG, RFX HPV ASCU
Chlamydia Probe Amp: NEGATIVE
GC Probe Amp: NEGATIVE

## 2013-04-11 LAB — VARICELLA ZOSTER ANTIBODY, IGG: Varicella IgG: 1199 Index — ABNORMAL HIGH (ref <135.00)

## 2013-04-11 LAB — BETA-2 GLYCOPROTEIN ANTIBODIES: Beta-2-Glycoprotein I IgM: 1 M Units (ref <20)

## 2013-04-11 LAB — CARDIOLIPIN ANTIBODIES, IGG, IGM, IGA: Anticardiolipin IgG: 6 GPL U/mL (ref <23)

## 2013-04-12 ENCOUNTER — Encounter: Payer: Self-pay | Admitting: *Deleted

## 2013-04-12 ENCOUNTER — Encounter: Payer: Self-pay | Admitting: Obstetrics & Gynecology

## 2013-04-12 LAB — HEMOGLOBINOPATHY EVALUATION
Hemoglobin Other: 0 %
Hgb F Quant: 0 % (ref 0.0–2.0)
Hgb S Quant: 0 %

## 2013-04-12 LAB — CULTURE, OB URINE: Colony Count: NO GROWTH

## 2013-04-15 ENCOUNTER — Inpatient Hospital Stay (HOSPITAL_COMMUNITY)
Admission: AD | Admit: 2013-04-15 | Discharge: 2013-04-15 | Disposition: A | Discharge status: Home / Self Care | Source: Ambulatory Visit | Attending: Obstetrics & Gynecology | Admitting: Obstetrics & Gynecology

## 2013-04-15 ENCOUNTER — Encounter (HOSPITAL_COMMUNITY): Payer: Self-pay | Admitting: *Deleted

## 2013-04-15 DIAGNOSIS — O99891 Other specified diseases and conditions complicating pregnancy: Secondary | ICD-10-CM | POA: Insufficient documentation

## 2013-04-15 DIAGNOSIS — L508 Other urticaria: Secondary | ICD-10-CM

## 2013-04-15 DIAGNOSIS — L509 Urticaria, unspecified: Secondary | ICD-10-CM | POA: Insufficient documentation

## 2013-04-15 MED ORDER — HYDROCORTISONE 1 % EX CREA
TOPICAL_CREAM | CUTANEOUS | Status: DC
  Filled 2013-04-15: qty 28

## 2013-04-15 NOTE — MAU Note (Signed)
Thursday evening around 1800 started itching at different areas. Today has gotten worse throughout the day and is spreading everywhere. Used Gold Bond Lotion for itching and has helped alittle. Have not changed soaps, detergent or anything.

## 2013-04-15 NOTE — MAU Provider Note (Signed)
Chief Complaint: Urticaria   First Provider Initiated Contact with Patient 04/15/13 0112     SUBJECTIVE HPI: Cheryl [name] is a 27 y.o. G3P0110 at [redacted]w[redacted]d by LMP who presents to maternity admissions reporting generalized hives x days.  She reports she did not eat anything new or use any new skin care products, detergents, or soaps before the hives occurred.  She reports never having hives before.  She brings pictures of her skin with hives to show the provider in MAU.  She reports that she used an anti-itch cream (by Blima Singer) before coming in to the hospital and the hives are less but will return in a few hours.  She has also taken PO Benadryl x2 since the onset of the hives.  She denies shortness of breath, abdominal pain, LOF, vaginal bleeding, vaginal itching/burning, urinary symptoms, h/a, dizziness, n/v, or fever/chills.      Past Medical History  Diagnosis Date  . Anemia   . Asthma   . Hypertension in pregnancy    History reviewed. No pertinent past surgical history. History   Social History  . Marital Status: Single    Spouse Name: N/A    Number of Children: N/A  . Years of Education: N/A   Occupational History  . Armed Officer/Security   . Student    Social History Main Topics  . Smoking status: Never Smoker   . Smokeless tobacco: Never Used  . Alcohol Use: No     Comment: Rarely  . Drug Use: No  . Sexually Active: Yes   Other Topics Concern  . Not on file   Social History Narrative  . No narrative on file   No current facility-administered medications on file prior to encounter.   Current Outpatient Prescriptions on File Prior to Encounter  Medication Sig Dispense Refill  . Multiple Vitamins-Minerals (WOMENS ONE DAILY PO) Take 1 capsule by mouth daily.      . ciprofloxacin-dexamethasone (CIPRODEX) otic suspension Place 4 drops into the right ear 2 (two) times daily.  7.5 mL  0   Allergies  Allergen Reactions  . Flagyl (Metronidazole) Swelling and Rash   . Latex Rash  . Penicillins Swelling and Rash    ROS: Pertinent items in HPI  OBJECTIVE Blood pressure 142/73, pulse 99, temperature 98.2 F (36.8 C), resp. rate 20, height 5\' 7"  (1.702 m), weight 92.08 kg (203 lb), last menstrual period 02/17/2013, SpO2 100.00%. GENERAL: Well-developed, well-nourished female in no acute distress.  HEENT: Normocephalic HEART: normal rate RESP: normal effort, lung sounds clear bilaterally in all lobes ABDOMEN: Soft, non-tender EXTREMITIES: Nontender, no edema, small macular rash on inner thigh and inner side of lower leg, generalized erythema of bilateral upper extremities NEURO: Alert and oriented  Pictures of skin pt shares from her cell phone indicate large circumscribed raised wheals of various sizes with erythematous borders on her trunk and all extremities   ASSESSMENT 1. Full body hives     PLAN Discharge home Topical hydrocortisone cream 1% Discontinue Gold Bond as ingredients not known to be safe in pregnancy (menthol, pain reliever) Benadryl 25-50 mg PO Q 6 hours PRN F/U with Dr Tamela Oddi this week if symptoms not resolved   Sharen Counter Certified Nurse-Midwife 04/15/2013  1:45 AM

## 2013-04-19 ENCOUNTER — Encounter: Payer: Self-pay | Admitting: Obstetrics & Gynecology

## 2013-04-19 ENCOUNTER — Ambulatory Visit (INDEPENDENT_AMBULATORY_CARE_PROVIDER_SITE_OTHER)

## 2013-04-19 ENCOUNTER — Other Ambulatory Visit: Payer: Self-pay | Admitting: Obstetrics & Gynecology

## 2013-04-19 DIAGNOSIS — O0990 Supervision of high risk pregnancy, unspecified, unspecified trimester: Secondary | ICD-10-CM

## 2013-04-19 DIAGNOSIS — O09299 Supervision of pregnancy with other poor reproductive or obstetric history, unspecified trimester: Secondary | ICD-10-CM

## 2013-04-19 DIAGNOSIS — O09891 Supervision of other high risk pregnancies, first trimester: Secondary | ICD-10-CM

## 2013-04-19 LAB — US OB DETAIL + 14 WK

## 2013-05-02 ENCOUNTER — Encounter: Payer: Self-pay | Admitting: Obstetrics

## 2013-05-02 ENCOUNTER — Institutional Professional Consult (permissible substitution)

## 2013-05-03 ENCOUNTER — Ambulatory Visit (INDEPENDENT_AMBULATORY_CARE_PROVIDER_SITE_OTHER): Admitting: Cardiology

## 2013-05-03 ENCOUNTER — Encounter: Payer: Self-pay | Admitting: Cardiology

## 2013-05-03 VITALS — BP 153/58 | HR 88 | Ht 67.0 in | Wt 200.0 lb

## 2013-05-03 DIAGNOSIS — I491 Atrial premature depolarization: Secondary | ICD-10-CM

## 2013-05-03 NOTE — Progress Notes (Signed)
HPI The patient presents for evaluation of an abnormal heartbeat. She has no prior cardiac history. She is pregnant about 10 weeks. Her first pregnancy was complicated by HELLP and subsequent stillbirth. She is now in high risk OB clinic. As part of this yesterday she was being evaluated in a [facility]phonic [facility] clinic. She was reported to have bradycardia. EKG subsequently she demonstrated atrial bigeminy. The patient does not notice these. She does not report palpitations, presyncope or syncope. She doesn't exercise routinely though she was recently in the [employer] and had no problems with this. She denies any chest pressure, neck or arm discomfort.  She denies any shortness of breath, PND or orthopnea.  Allergies  Allergen Reactions  . Amoxicillin Rash and Swelling  . Flagyl [Metronidazole] Swelling and Rash  . Latex Rash  . Penicillins Swelling and Rash    Current Outpatient Prescriptions  Medication Sig Dispense Refill  . ciprofloxacin-dexamethasone (CIPRODEX) otic suspension Place 4 drops into the right ear 2 (two) times daily.  7.5 mL  0  . diphenhydrAMINE (BENADRYL) 25 mg capsule Take 50 mg by mouth every 6 (six) hours as needed for itching.      . Multiple Vitamins-Minerals (WOMENS ONE DAILY PO) Take 1 capsule by mouth daily.       No current facility-administered medications for this visit.    Past Medical History  Diagnosis Date  . Anemia   . Asthma   . Hypertension in pregnancy     No past surgical history on file.  Family History  Problem Relation Age of Onset  . Cancer Maternal Grandmother     Bladder  . Diabetes Maternal Grandmother   . Diabetes Maternal Grandfather   . Diabetes Mother     History   Social History  . Marital Status: Single    Spouse Name: N/A    Number of Children: N/A  . Years of Education: N/A   Occupational History  . Armed Officer/Security   . Student    Social History Main Topics  . Smoking status: Never Smoker   . Smokeless  tobacco: Never Used  . Alcohol Use: No     Comment: Rarely  . Drug Use: No  . Sexual Activity: Yes   Other Topics Concern  . Not on file   Social History Narrative  . No narrative on file    ROS:  Positive for urinary frequency, joint pains, rhinitis. Otherwise as stated in the HPI and negative for all other systems.  PHYSICAL EXAM BP 153/58  Pulse 88  Ht 5\' 7"  (1.702 m)  Wt 200 lb (90.719 kg)  BMI 31.32 kg/m2  LMP 02/17/2013 GENERAL:  Well appearing HEENT:  Pupils equal round and reactive, fundi not visualized, oral mucosa unremarkable NECK:  No jugular venous distention, waveform within normal limits, carotid upstroke brisk and symmetric, no bruits, no thyromegaly LYMPHATICS:  No cervical, inguinal adenopathy LUNGS:  Clear to auscultation bilaterally BACK:  No CVA tenderness CHEST:  Unremarkable HEART:  PMI not displaced or sustained,S1 and S2 within normal limits, no S3, no S4, no clicks, no rubs, no murmurs ABD:  Flat, positive bowel sounds normal in frequency in pitch, no bruits, no rebound, no guarding, no midline pulsatile mass, no hepatomegaly, no splenomegaly EXT:  2 plus pulses throughout, no edema, no cyanosis no clubbing SKIN:  No rashes no nodules NEURO:  Cranial nerves II through XII grossly intact, motor grossly intact throughout PSYCH:  Cognitively intact, oriented to person place and time  EKG:  Sinus rhythm, rate 88, premature atrial contractions in a bigeminal pattern, axis within normal limits, intervals within normal limits, no acute ST-T wave changes. 05/03/2013  ASSESSMENT AND PLAN  ATRIAL BIGEMINY:  The patient has no symptoms related to this. This would explain why her heart rate was recorded in the 40s as the monitor was likely under counting with premature beats.  I will check an echocardiogram,  24-hour Holter and a TSH.  We discussed the physiology of this at length. I do not strongly suspect structural heart disease or that this will complicate  her pregnancy. I will however see her back in followup during the course of her pregnancy and she should let me know of course if she develops any symptoms.  HTN:  For now this can be followed and treated as indicated during the course of her pregnancy.

## 2013-05-03 NOTE — Patient Instructions (Addendum)
LAB TODAY: TSH  Your physician has recommended that you wear a holter monitor. Holter monitors are medical devices that record the heart's electrical activity. Doctors most often use these monitors to diagnose arrhythmias. Arrhythmias are problems with the speed or rhythm of the heartbeat. The monitor is a small, portable device. You can wear one while you do your normal daily activities. This is usually used to diagnose what is causing palpitations/syncope (passing out).  Your physician has requested that you have an echocardiogram. Echocardiography is a painless test that uses sound waves to create images of your heart. It provides your doctor with information about the size and shape of your heart and how well your heart's chambers and valves are working. This procedure takes approximately one hour. There are no restrictions for this procedure.  Your physician recommends that you schedule a follow-up appointment in: 3 MONTHS   Premature Beats A premature beat is an extra heartbeat that happens earlier than normal. Premature beats are called premature atrial contractions (PACs) or premature ventricular contractions (PVCs) depending on the area of the heart where they start. CAUSES  Premature beats may be brought on by a variety of factors including:  Emotional stress.  Lack of sleep.  Caffeine.  Asthma medicines.  Stimulants.  Herbal teas.  Dietary supplements.  Alcohol. In most cases, premature beats are not dangerous and are not a sign of serious heart disease. Most patients evaluated for premature beats have completely normal heart function. Rarely, premature beats may be a sign of more significant heart problems or medical illness. SYMPTOMS  Premature beats may cause palpitations. This means you feel like your heart is skipping a beat or beating harder than usual. Sometimes, slight chest pain occurs with premature beats, lasting only a few seconds. This pain has been described as  a "flopping" feeling inside the chest. In many cases, premature beats do not cause any symptoms and they are only detected when an electrocardiography test (EKG) or heart monitoring is performed. DIAGNOSIS  Your caregiver may run some tests to evaluate your heart such as an EKG or echocardiography. You may need to wear a portable heart monitor for several days to record the electrical activity of your heart. Blood testing may also be performed to check your electrolytes and thyroid function. TREATMENT  Premature beats usually go away with rest. If the problem continues, your caregiver will determine a treatment plan for you.  HOME CARE INSTRUCTIONS  Get plenty of rest over the next few days until your symptoms improve.  Avoid coffee, tea, alcohol, and soda (pop, cola).  Do not smoke. SEEK MEDICAL CARE IF:  Your symptoms continue after 1 to 2 days of rest.  You have new symptoms, such as chest pain or trouble breathing. SEEK IMMEDIATE MEDICAL CARE IF:  You have severe chest pain or abdominal pain.  You have pain that radiates into the neck, arm, or jaw.  You faint or have extreme weakness.  You have shortness of breath.  Your heartbeat races for more than 5 seconds. MAKE SURE YOU:  Understand these instructions.  Will watch your condition.  Will get help right away if you are not doing well or get worse. Document Released: 10/08/2004 Document Revised: 11/23/2011 Document Reviewed: 05/04/2011 [facility] Patient Information 2014 [place], [place].

## 2013-05-08 ENCOUNTER — Encounter: Payer: Self-pay | Admitting: *Deleted

## 2013-05-08 ENCOUNTER — Encounter (INDEPENDENT_AMBULATORY_CARE_PROVIDER_SITE_OTHER)

## 2013-05-08 DIAGNOSIS — I491 Atrial premature depolarization: Secondary | ICD-10-CM

## 2013-05-08 NOTE — Progress Notes (Signed)
Patient ID: Cheryl [name], female   DOB: 1986/07/26, 27 y.o.   MRN: [id number] E-Cardio 24 Hour Holter Monitor applied to patient.

## 2013-05-09 ENCOUNTER — Ambulatory Visit (HOSPITAL_COMMUNITY): Attending: Cardiovascular Disease | Admitting: Radiology

## 2013-05-09 DIAGNOSIS — R9431 Abnormal electrocardiogram [ECG] [EKG]: Secondary | ICD-10-CM | POA: Insufficient documentation

## 2013-05-09 DIAGNOSIS — I491 Atrial premature depolarization: Secondary | ICD-10-CM

## 2013-05-09 DIAGNOSIS — O2690 Pregnancy related conditions, unspecified, unspecified trimester: Secondary | ICD-10-CM | POA: Insufficient documentation

## 2013-05-09 DIAGNOSIS — I498 Other specified cardiac arrhythmias: Secondary | ICD-10-CM | POA: Insufficient documentation

## 2013-05-09 NOTE — Progress Notes (Signed)
Echocardiogram performed.  

## 2013-05-11 ENCOUNTER — Encounter: Admitting: Obstetrics & Gynecology

## 2013-05-12 ENCOUNTER — Ambulatory Visit (INDEPENDENT_AMBULATORY_CARE_PROVIDER_SITE_OTHER): Admitting: Advanced Practice Midwife

## 2013-05-12 ENCOUNTER — Encounter: Payer: Self-pay | Admitting: Advanced Practice Midwife

## 2013-05-12 VITALS — BP 129/77 | Temp 98.6°F | Wt 201.4 lb

## 2013-05-12 DIAGNOSIS — O09299 Supervision of pregnancy with other poor reproductive or obstetric history, unspecified trimester: Secondary | ICD-10-CM

## 2013-05-12 DIAGNOSIS — Z3400 Encounter for supervision of normal first pregnancy, unspecified trimester: Secondary | ICD-10-CM

## 2013-05-12 DIAGNOSIS — Z3402 Encounter for supervision of normal first pregnancy, second trimester: Secondary | ICD-10-CM

## 2013-05-12 DIAGNOSIS — Z3401 Encounter for supervision of normal first pregnancy, first trimester: Secondary | ICD-10-CM

## 2013-05-12 LAB — POCT URINALYSIS DIPSTICK
Bilirubin, UA: NEGATIVE
Blood, UA: NEGATIVE
Glucose, UA: NEGATIVE
Nitrite, UA: NEGATIVE
Spec Grav, UA: 1.01

## 2013-05-12 LAB — LACTATE DEHYDROGENASE: LDH: 135 U/L (ref 94–250)

## 2013-05-12 LAB — AST: AST: 14 U/L (ref 0–37)

## 2013-05-12 NOTE — Progress Notes (Signed)
Pulse: 50

## 2013-05-12 NOTE — Progress Notes (Signed)
Routine Obstetrical Visit  Subjective:    Cheryl [name] is being seen today for her routine obstetrical visit. She is at [redacted]w[redacted]d gestation.   Patient reports no complaints.   Objective:     BP 129/77  Temp(Src) 98.6 F (37 C)  Wt 201 lb 6.4 oz (91.354 kg)  BMI 31.54 kg/m2  LMP 02/17/2013  FHR difficult to auscultate, visualized FHR by bedside US. + Fetal Movement Uterus @ SP    Assessment:    Pregnancy: [id number] Patient Active Problem List   Diagnosis Date Noted  . High risk pregnancy due to history of previous obstetrical problem 05/12/2013  . PAC (premature atrial contraction) 05/03/2013  . Previous preterm delivery in second trimester, antepartum 04/10/2013  . Unspecified high-risk pregnancy 04/10/2013  . Chronic constipation 01/27/2011  . Anemia 01/27/2011       Plan:     Prenatal vitamins. Problem list reviewed and updated. Patient had MFM consult, will try to obtain recommendations for chart. Patient taking 81 mg Aspirin Daily. 24 hour urine in 2 weeks. Baseline PIH labwork today. Plan high risk [place], at 18 weeks unless recommended before this.  Follow up in 2 weeks. Quad NV.  90% of 20 min visit spent on counseling and coordination of care.     Gildardo Tickner 05/12/2013

## 2013-05-13 LAB — CBC
HCT: 37.2 % (ref 36.0–46.0)
MCH: 28.8 pg (ref 26.0–34.0)
MCV: 87.9 fL (ref 78.0–100.0)
Platelets: 116 10*3/uL — ABNORMAL LOW (ref 150–400)
RBC: 4.23 MIL/uL (ref 3.87–5.11)

## 2013-05-13 LAB — PROTEIN / CREATININE RATIO, URINE
Protein Creatinine Ratio: 0.03 (ref <0.15)
Total Protein, Urine: 7 mg/dL

## 2013-05-17 ENCOUNTER — Encounter: Payer: Self-pay | Admitting: Obstetrics & Gynecology

## 2013-05-17 DIAGNOSIS — O99210 Obesity complicating pregnancy, unspecified trimester: Secondary | ICD-10-CM | POA: Insufficient documentation

## 2013-05-22 ENCOUNTER — Other Ambulatory Visit: Payer: Self-pay | Admitting: *Deleted

## 2013-05-22 DIAGNOSIS — O0990 Supervision of high risk pregnancy, unspecified, unspecified trimester: Secondary | ICD-10-CM

## 2013-05-26 ENCOUNTER — Encounter: Payer: Self-pay | Admitting: Advanced Practice Midwife

## 2013-05-26 ENCOUNTER — Other Ambulatory Visit: Payer: Self-pay | Admitting: *Deleted

## 2013-05-26 ENCOUNTER — Ambulatory Visit (INDEPENDENT_AMBULATORY_CARE_PROVIDER_SITE_OTHER): Admitting: Advanced Practice Midwife

## 2013-05-26 ENCOUNTER — Encounter: Payer: Self-pay | Admitting: Obstetrics & Gynecology

## 2013-05-26 VITALS — BP 126/78 | Temp 98.6°F | Wt 200.6 lb

## 2013-05-26 DIAGNOSIS — Z3400 Encounter for supervision of normal first pregnancy, unspecified trimester: Secondary | ICD-10-CM

## 2013-05-26 DIAGNOSIS — D696 Thrombocytopenia, unspecified: Secondary | ICD-10-CM | POA: Insufficient documentation

## 2013-05-26 DIAGNOSIS — O09292 Supervision of pregnancy with other poor reproductive or obstetric history, second trimester: Secondary | ICD-10-CM

## 2013-05-26 DIAGNOSIS — O09299 Supervision of pregnancy with other poor reproductive or obstetric history, unspecified trimester: Secondary | ICD-10-CM

## 2013-05-26 DIAGNOSIS — Z3402 Encounter for supervision of normal first pregnancy, second trimester: Secondary | ICD-10-CM

## 2013-05-26 NOTE — Addendum Note (Signed)
Addended by: George Hugh on: 05/26/2013 05:09 PM   Modules accepted: Orders

## 2013-05-26 NOTE — Progress Notes (Signed)
Subjective: Cheryl [name] is a 27 y.o. at 14 weeks by LMP  Patient denies vaginal leaking of fluid or bleeding, denies contractions.  Reports absent fetal movment.  Denies concerns today.  Objective: Filed Vitals:   05/26/13 1426  BP: 126/78  Temp: 98.6 F (37 C)   150 FHR SP Fundal Height Fetal Position unknown  Assessment: Patient Active Problem List   Diagnosis Date Noted  . Thrombocytopenia 05/26/2013  . High risk pregnancy due to history of previous obstetrical problem 05/12/2013  . PAC (premature atrial contraction) 05/03/2013  . Previous preterm delivery in second trimester, antepartum 04/10/2013  . Unspecified high-risk pregnancy 04/10/2013  . Chronic constipation 01/27/2011  . Anemia 01/27/2011    Plan: Patient to return to clinic in 1 weeks Reviewed warning signs in pregnancy. Patient to call with concerns PRN. Reviewed triage location. MFM consult next week High risk [place] ordered. Monitor Platelets, repeat count today.  Zendayah Hardgrave Wilson Singer CNM

## 2013-05-26 NOTE — Addendum Note (Signed)
Addended by: George Hugh on: 05/26/2013 05:05 PM   Modules accepted: Orders

## 2013-05-26 NOTE — Progress Notes (Signed)
Pulse 70 

## 2013-05-27 LAB — PROTEIN, URINE, 24 HOUR: Protein, Urine: 3 mg/dL

## 2013-05-27 LAB — CREATININE CLEARANCE, URINE, 24 HOUR
Creatinine Clearance: 173 mL/min — ABNORMAL HIGH (ref 75–115)
Creatinine, 24H Ur: 1848 mg/d — ABNORMAL HIGH (ref 700–1800)
Creatinine, Urine: 92.4 mg/dL
Creatinine: 0.74 mg/dL (ref 0.50–1.10)

## 2013-05-27 LAB — CBC
HCT: 36.4 % (ref 36.0–46.0)
MCH: 28.6 pg (ref 26.0–34.0)
MCHC: 33 g/dL (ref 30.0–36.0)
RDW: 14.6 % (ref 11.5–15.5)

## 2013-05-30 ENCOUNTER — Institutional Professional Consult (permissible substitution): Payer: Non-veteran care

## 2013-05-30 ENCOUNTER — Encounter: Payer: Self-pay | Admitting: Advanced Practice Midwife

## 2013-05-30 LAB — AFP, QUAD SCREEN
AFP: 20.1 [IU]/mL
Age Alone: 1:762 {titer}
Curr Gest Age: 14 wks.days
Down Syndrome Scr Risk Est: 1:4410 {titer}
HCG, Total: 26052 m[IU]/mL
INH: 227.3 pg/mL
Interpretation-AFP: NEGATIVE
MoM for AFP: 0.9
MoM for INH: 1.15
MoM for hCG: 0.81
Open Spina bifida: NEGATIVE
Osb Risk: 1:37400 {titer}
Tri 18 Scr Risk Est: NEGATIVE
Trisomy 18 (Edward) Syndrome Interp.: 1:442 {titer}
uE3 Mom: 0.43
uE3 Value: 0.1 ng/mL

## 2013-06-08 ENCOUNTER — Telehealth: Payer: Self-pay

## 2013-06-08 ENCOUNTER — Encounter: Payer: Self-pay | Admitting: Obstetrics & Gynecology

## 2013-06-08 NOTE — Telephone Encounter (Signed)
GAVE PATIENT RESULTS OF HOLTER MONITOR

## 2013-06-09 ENCOUNTER — Encounter: Payer: Self-pay | Admitting: Advanced Practice Midwife

## 2013-06-09 ENCOUNTER — Ambulatory Visit (INDEPENDENT_AMBULATORY_CARE_PROVIDER_SITE_OTHER): Admitting: Advanced Practice Midwife

## 2013-06-09 VITALS — BP 120/71 | Temp 98.9°F | Wt 205.0 lb

## 2013-06-09 DIAGNOSIS — D696 Thrombocytopenia, unspecified: Secondary | ICD-10-CM

## 2013-06-09 DIAGNOSIS — Z3400 Encounter for supervision of normal first pregnancy, unspecified trimester: Secondary | ICD-10-CM

## 2013-06-09 DIAGNOSIS — Z3482 Encounter for supervision of other normal pregnancy, second trimester: Secondary | ICD-10-CM

## 2013-06-09 DIAGNOSIS — Z3480 Encounter for supervision of other normal pregnancy, unspecified trimester: Secondary | ICD-10-CM

## 2013-06-09 LAB — CBC
MCH: 29 pg (ref 26.0–34.0)
MCHC: 33.2 g/dL (ref 30.0–36.0)
MCV: 87.2 fL (ref 78.0–100.0)
Platelets: 125 10*3/uL — ABNORMAL LOW (ref 150–400)
RDW: 14.6 % (ref 11.5–15.5)

## 2013-06-09 NOTE — Progress Notes (Signed)
P-82 

## 2013-06-09 NOTE — Progress Notes (Signed)
Subjective: Cheryl [name] is a 27 y.o. at 16 weeks by LMP  Patient denies vaginal leaking of fluid or bleeding, denies contractions.  Reports positive fetal movment.  Denies concerns today.  Taking baby aspirin.  Objective: Filed Vitals:   06/09/13 1428  BP: 120/71  Temp: 98.9 F (37.2 C)   150 FHR Below U Fundal Height Fetal Position unknown  Assessment: Patient Active Problem List   Diagnosis Date Noted  . Thrombocytopenia 05/26/2013  . High risk pregnancy due to history of previous obstetrical problem 05/12/2013  . PAC (premature atrial contraction) 05/03/2013  . Previous preterm delivery in second trimester, antepartum 04/10/2013  . Unspecified high-risk pregnancy 04/10/2013  . Chronic constipation 01/27/2011  . Anemia 01/27/2011  No Preeclampsia symptoms  Plan: Patient to return to clinic in 2-3 weeks Repeat CBC today MFM consult 10/9 Cont baby aspirin Reviewed warning signs in pregnancy. Patient to call with concerns PRN. Reviewed triage location.

## 2013-06-10 ENCOUNTER — Encounter: Payer: Self-pay | Admitting: Obstetrics & Gynecology

## 2013-06-22 ENCOUNTER — Ambulatory Visit (HOSPITAL_COMMUNITY)
Admission: RE | Admit: 2013-06-22 | Discharge: 2013-06-22 | Disposition: A | Discharge status: Home / Self Care | Source: Ambulatory Visit | Attending: Advanced Practice Midwife | Admitting: Advanced Practice Midwife

## 2013-06-22 ENCOUNTER — Other Ambulatory Visit: Payer: Self-pay | Admitting: Advanced Practice Midwife

## 2013-06-22 ENCOUNTER — Ambulatory Visit (HOSPITAL_COMMUNITY): Admission: RE | Admit: 2013-06-22 | Source: Ambulatory Visit

## 2013-06-22 DIAGNOSIS — Z363 Encounter for antenatal screening for malformations: Secondary | ICD-10-CM | POA: Insufficient documentation

## 2013-06-22 DIAGNOSIS — D693 Immune thrombocytopenic purpura: Secondary | ICD-10-CM | POA: Insufficient documentation

## 2013-06-22 DIAGNOSIS — D689 Coagulation defect, unspecified: Secondary | ICD-10-CM | POA: Insufficient documentation

## 2013-06-22 DIAGNOSIS — Z8751 Personal history of pre-term labor: Secondary | ICD-10-CM | POA: Insufficient documentation

## 2013-06-22 DIAGNOSIS — O358XX0 Maternal care for other (suspected) fetal abnormality and damage, not applicable or unspecified: Secondary | ICD-10-CM | POA: Insufficient documentation

## 2013-06-22 DIAGNOSIS — O0990 Supervision of high risk pregnancy, unspecified, unspecified trimester: Secondary | ICD-10-CM

## 2013-06-22 DIAGNOSIS — Z1389 Encounter for screening for other disorder: Secondary | ICD-10-CM | POA: Insufficient documentation

## 2013-06-23 ENCOUNTER — Ambulatory Visit (INDEPENDENT_AMBULATORY_CARE_PROVIDER_SITE_OTHER): Admitting: Advanced Practice Midwife

## 2013-06-23 ENCOUNTER — Encounter: Payer: Self-pay | Admitting: Advanced Practice Midwife

## 2013-06-23 VITALS — BP 126/65 | Temp 97.9°F | Wt 206.0 lb

## 2013-06-23 DIAGNOSIS — O09212 Supervision of pregnancy with history of pre-term labor, second trimester: Secondary | ICD-10-CM

## 2013-06-23 DIAGNOSIS — Z3400 Encounter for supervision of normal first pregnancy, unspecified trimester: Secondary | ICD-10-CM

## 2013-06-23 DIAGNOSIS — O09219 Supervision of pregnancy with history of pre-term labor, unspecified trimester: Secondary | ICD-10-CM

## 2013-06-23 DIAGNOSIS — Z3402 Encounter for supervision of normal first pregnancy, second trimester: Secondary | ICD-10-CM

## 2013-06-23 LAB — POCT URINALYSIS DIPSTICK
Blood, UA: NEGATIVE
Leukocytes, UA: NEGATIVE
Nitrite, UA: NEGATIVE
pH, UA: 6

## 2013-06-23 NOTE — Progress Notes (Signed)
Pulse- 83 Pt states she is having pelvic pain. Pt states the only thing that helps is rest.

## 2013-06-23 NOTE — Progress Notes (Signed)
Subjective: Cheryl [name] is a 27 y.o. at 18 weeks by LMP, 18 wk [place]  Patient denies vaginal leaking of fluid or bleeding, denies contractions.  Reports absent fetal movment.  Denies concerns today. Doing well, HA improved. Taking baby ASA daily. Expecting a girl.   Objective: Filed Vitals:   06/23/13 1500  BP: 126/65  Temp: 97.9 F (36.6 C)   140 FHR U Fundal Height Fetal Position unknown  Urine dipstick shows trace for protein.      Assessment: Patient Active Problem List   Diagnosis Date Noted  . Thrombocytopenia 05/26/2013  . High risk pregnancy due to history of previous obstetrical problem 05/12/2013  . PAC (premature atrial contraction) 05/03/2013  . Previous preterm delivery in second trimester, antepartum 04/10/2013  . Unspecified high-risk pregnancy 04/10/2013  . Chronic constipation 01/27/2011  . Anemia 01/27/2011    Plan: Patient to return to clinic in 2 weeks UA, CBC NV Repeat US in 4-6 weeks reevaluate heart Reviewed warning signs in pregnancy. Patient to call with concerns PRN. Reviewed triage location.  20 min spent with patient greater than 80% spent in counseling and coordination of care.   Lillie Bollig Wilson Singer CNM

## 2013-06-26 ENCOUNTER — Other Ambulatory Visit: Payer: Self-pay | Admitting: Obstetrics & Gynecology

## 2013-06-26 DIAGNOSIS — O09891 Supervision of other high risk pregnancies, first trimester: Secondary | ICD-10-CM

## 2013-06-26 DIAGNOSIS — O0990 Supervision of high risk pregnancy, unspecified, unspecified trimester: Secondary | ICD-10-CM

## 2013-06-28 ENCOUNTER — Ambulatory Visit (INDEPENDENT_AMBULATORY_CARE_PROVIDER_SITE_OTHER): Admitting: Obstetrics

## 2013-06-28 ENCOUNTER — Encounter: Payer: Self-pay | Admitting: Obstetrics

## 2013-06-28 VITALS — BP 134/82 | Temp 97.6°F | Wt 208.0 lb

## 2013-06-28 DIAGNOSIS — Z3402 Encounter for supervision of normal first pregnancy, second trimester: Secondary | ICD-10-CM

## 2013-06-28 DIAGNOSIS — Z3400 Encounter for supervision of normal first pregnancy, unspecified trimester: Secondary | ICD-10-CM

## 2013-06-28 LAB — POCT URINALYSIS DIPSTICK
Bilirubin, UA: NEGATIVE
Blood, UA: NEGATIVE
Glucose, UA: NEGATIVE
Nitrite, UA: NEGATIVE
Spec Grav, UA: 1.01
Urobilinogen, UA: NEGATIVE
pH, UA: 8

## 2013-06-28 NOTE — Progress Notes (Signed)
Pt states she has had a few high blood pressure readings in the past few days. 143/95 was the last reading  Pulse-76 Pt states she has been having headaches that are helped by tylenol.

## 2013-06-29 ENCOUNTER — Encounter: Admitting: Obstetrics

## 2013-07-04 ENCOUNTER — Ambulatory Visit (INDEPENDENT_AMBULATORY_CARE_PROVIDER_SITE_OTHER): Admitting: Advanced Practice Midwife

## 2013-07-04 VITALS — BP 130/72 | Temp 97.1°F | Wt 210.0 lb

## 2013-07-04 DIAGNOSIS — Z3400 Encounter for supervision of normal first pregnancy, unspecified trimester: Secondary | ICD-10-CM

## 2013-07-04 DIAGNOSIS — Z3402 Encounter for supervision of normal first pregnancy, second trimester: Secondary | ICD-10-CM

## 2013-07-04 LAB — POCT URINALYSIS DIPSTICK
Blood, UA: NEGATIVE
Glucose, UA: NEGATIVE
Ketones, UA: NEGATIVE
Spec Grav, UA: 1.015
Urobilinogen, UA: NEGATIVE

## 2013-07-04 LAB — CBC
HCT: 33.5 % — ABNORMAL LOW (ref 36.0–46.0)
MCH: 29.6 pg (ref 26.0–34.0)
MCV: 87 fL (ref 78.0–100.0)
RBC: 3.85 MIL/uL — ABNORMAL LOW (ref 3.87–5.11)
WBC: 8.2 10*3/uL (ref 4.0–10.5)

## 2013-07-04 NOTE — Progress Notes (Signed)
Pulse: 78

## 2013-07-04 NOTE — Progress Notes (Signed)
Subjective: ABBAGALE GOGUEN is a 27 y.o. at 19 weeks by LMP  Patient denies vaginal leaking of fluid or bleeding, denies contractions.  Reports positive fetal movment.  Denies concerns today. Denies HA, RUQ pain or vision changes.  Objective: Filed Vitals:   07/04/13 1533  BP: 130/72  Temp: 97.1 F (36.2 C)   145 FHR @U  Fundal Height Fetal Position unknown  Urine dipstick shows negative for all components.  Micro exam: negative for WBC's or RBC's.   Assessment: Patient Active Problem List   Diagnosis Date Noted  . Thrombocytopenia 05/26/2013  . High risk pregnancy due to history of previous obstetrical problem 05/12/2013  . PAC (premature atrial contraction) 05/03/2013  . Previous preterm delivery in second trimester, antepartum 04/10/2013  . Unspecified high-risk pregnancy 04/10/2013  . Chronic constipation 01/27/2011  . Anemia 01/27/2011    Plan: Patient to return to clinic in 2 weeks MFM Consultation w/ Kendell Bane to be scheduled Repeat CBC today Reviewed warning signs in pregnancy. Patient to call with concerns PRN. Reviewed triage location.   20 min spent with patient greater than 80% spent in counseling and coordination of care.   Madge Therrien Wilson Singer CNM

## 2013-07-07 ENCOUNTER — Encounter: Payer: Self-pay | Admitting: Obstetrics & Gynecology

## 2013-07-07 LAB — US OB DETAIL + 14 WK

## 2013-07-18 ENCOUNTER — Encounter: Payer: Self-pay | Admitting: Advanced Practice Midwife

## 2013-07-18 ENCOUNTER — Ambulatory Visit (INDEPENDENT_AMBULATORY_CARE_PROVIDER_SITE_OTHER): Admitting: Advanced Practice Midwife

## 2013-07-18 ENCOUNTER — Institutional Professional Consult (permissible substitution)

## 2013-07-18 VITALS — BP 124/76 | Temp 98.9°F | Wt 210.0 lb

## 2013-07-18 DIAGNOSIS — Z3402 Encounter for supervision of normal first pregnancy, second trimester: Secondary | ICD-10-CM

## 2013-07-18 DIAGNOSIS — Z3400 Encounter for supervision of normal first pregnancy, unspecified trimester: Secondary | ICD-10-CM

## 2013-07-18 DIAGNOSIS — D649 Anemia, unspecified: Secondary | ICD-10-CM

## 2013-07-18 LAB — POCT URINALYSIS DIPSTICK
Bilirubin, UA: NEGATIVE
Blood, UA: NEGATIVE
Glucose, UA: NEGATIVE
Nitrite, UA: NEGATIVE
Spec Grav, UA: 1.01
Urobilinogen, UA: NEGATIVE
pH, UA: 7

## 2013-07-18 NOTE — Progress Notes (Signed)
Subjective: Cheryl [name] is a 27 y.o. at 21 weeks by LMP  Patient denies vaginal leaking of fluid or bleeding, denies contractions.  Reports positive fetal movment.  Denies concerns today.  Objective: Filed Vitals:   07/18/13 0840  BP: 124/76  Temp: 98.9 F (37.2 C)   140 FHR @U  Fundal Height Fetal Position unknown Urine dipstick shows trace protein    Assessment: Patient Active Problem List   Diagnosis Date Noted  . Thrombocytopenia 05/26/2013  . High risk pregnancy due to history of previous obstetrical problem 05/12/2013  . PAC (premature atrial contraction) 05/03/2013  . Previous preterm delivery in second trimester, antepartum 04/10/2013  . Unspecified high-risk pregnancy 04/10/2013  . Chronic constipation 01/27/2011  . Anemia 01/27/2011    Plan: Patient to return to clinic in 2 weeks Encouraged iron supplementation today, discussed Floradix Reviewed methods to help w/ common pelvic pain in pregnancy Reviewed warning signs in pregnancy. Patient to call with concerns PRN. Reviewed triage location. Patient has repeat US scheduled in Nov. Patient has MFM Consultation appt scheduled.  15 min spent with patient greater than 80% spent in counseling and coordination of care.   Kimiyo Carmicheal Wilson Singer CNM

## 2013-07-18 NOTE — Progress Notes (Signed)
P 75 Patient reports she is doing well

## 2013-07-25 ENCOUNTER — Ambulatory Visit (INDEPENDENT_AMBULATORY_CARE_PROVIDER_SITE_OTHER): Admitting: *Deleted

## 2013-07-25 ENCOUNTER — Institutional Professional Consult (permissible substitution): Payer: Non-veteran care

## 2013-07-25 DIAGNOSIS — Z23 Encounter for immunization: Secondary | ICD-10-CM

## 2013-07-25 DIAGNOSIS — Z3400 Encounter for supervision of normal first pregnancy, unspecified trimester: Secondary | ICD-10-CM

## 2013-07-26 ENCOUNTER — Encounter: Payer: Self-pay | Admitting: Advanced Practice Midwife

## 2013-07-27 ENCOUNTER — Ambulatory Visit (INDEPENDENT_AMBULATORY_CARE_PROVIDER_SITE_OTHER): Admitting: Cardiology

## 2013-07-27 ENCOUNTER — Encounter: Payer: Self-pay | Admitting: Cardiology

## 2013-07-27 VITALS — BP 128/64 | HR 96 | Ht 68.0 in | Wt 209.0 lb

## 2013-07-27 DIAGNOSIS — I491 Atrial premature depolarization: Secondary | ICD-10-CM

## 2013-07-27 NOTE — Patient Instructions (Signed)
The current medical regimen is effective;  continue present plan and medications.  Follow up as needed 

## 2013-07-27 NOTE — Progress Notes (Signed)
    HPI The patient presents for evaluation of palpitations. She is now 6 months pregnant. In the summer she did wear a monitor that demonstrated normal sinus rhythm, sinus tachycardia, rare PVCs, PACs with some nonconducted atrial ectopy and bigeminy. However, since I saw her she's not better. She's not having any acute symptoms. She denies any presyncope or syncope. She denies any chest pressure, neck or arm discomfort. She's had no weight gain or edema. Her pregnancy is going well. She denies any shortness of breath, PND or orthopnea. Her blood pressure is been well controlled. I also reviewed TSH which was normal. She had an echocardiogram which was unremarkable.  Allergies  Allergen Reactions  . Amoxicillin Rash and Swelling  . Flagyl [Metronidazole] Swelling and Rash  . Latex Rash  . Penicillins Swelling and Rash    Current Outpatient Prescriptions  Medication Sig Dispense Refill  . aspirin EC 81 MG tablet Take 81 mg by mouth.      . diphenhydrAMINE (BENADRYL) 25 mg capsule Take 50 mg by mouth every 6 (six) hours as needed for itching.      . Multiple Vitamins-Minerals (WOMENS ONE DAILY PO) Take 1 capsule by mouth daily.      . Prenatal Vit-Fe Fumarate-FA (MULTIVITAMIN-PRENATAL) 27-0.8 MG TABS tablet Take by mouth.       No current facility-administered medications for this visit.    Past Medical History  Diagnosis Date  . Anemia   . Asthma   . HELLP syndrome     Past Surgical History  Procedure Laterality Date  . None      ROS:  Positive for urinary frequency, joint pains, rhinitis. Otherwise as stated in the HPI and negative for all other systems.  PHYSICAL EXAM BP 128/64  Pulse 96  Ht 5\' 8"  (1.727 m)  Wt 209 lb (94.802 kg)  BMI 31.79 kg/m2  SpO2 99%  LMP 02/17/2013 GENERAL:  Well appearing NECK:  No jugular venous distention, waveform within normal limits, carotid upstroke brisk and symmetric, no bruits, no thyromegaly LUNGS:  Clear to auscultation  bilaterally BACK:  No CVA tenderness CHEST:  Unremarkable HEART:  PMI not displaced or sustained,S1 and S2 within normal limits, no S3, no S4, no clicks, no rubs, no murmurs ABD:  Flat, positive bowel sounds normal in frequency in pitch, no bruits, no rebound, no guarding, no midline pulsatile mass, no hepatomegaly, no splenomegaly, gravid EXT:  2 plus pulses throughout, no edema, no cyanosis no clubbing   ASSESSMENT AND PLAN  ATRIAL BIGEMINY:  She seems to have an otherwise normal heart. She's not particularly bothered by these. At this point change in therapy or further evaluation is indicated.  HTN: her blood pressure is currently controlled. She is being watched closely in the high risk OB clinic.

## 2013-07-31 ENCOUNTER — Encounter: Payer: Self-pay | Admitting: Obstetrics & Gynecology

## 2013-08-01 ENCOUNTER — Ambulatory Visit (INDEPENDENT_AMBULATORY_CARE_PROVIDER_SITE_OTHER): Admitting: Advanced Practice Midwife

## 2013-08-01 ENCOUNTER — Encounter: Payer: Self-pay | Admitting: Advanced Practice Midwife

## 2013-08-01 VITALS — BP 120/71 | Temp 98.5°F | Wt 212.0 lb

## 2013-08-01 DIAGNOSIS — Z3400 Encounter for supervision of normal first pregnancy, unspecified trimester: Secondary | ICD-10-CM

## 2013-08-01 DIAGNOSIS — Z3402 Encounter for supervision of normal first pregnancy, second trimester: Secondary | ICD-10-CM

## 2013-08-01 DIAGNOSIS — O26879 Cervical shortening, unspecified trimester: Secondary | ICD-10-CM

## 2013-08-01 LAB — POCT URINALYSIS DIPSTICK
Blood, UA: NEGATIVE
Glucose, UA: NEGATIVE
Ketones, UA: NEGATIVE
Protein, UA: NEGATIVE
Spec Grav, UA: 1.015

## 2013-08-01 NOTE — Progress Notes (Signed)
Pulse: 75

## 2013-08-01 NOTE — Progress Notes (Signed)
Subjective: Cheryl [name] is a 27 y.o. at 23 weeks by LMP  Patient denies vaginal leaking of fluid or bleeding, denies contractions.  Reports positive fetal movment.  Denies concerns today.  Objective: Filed Vitals:   08/01/13 1558  BP: 120/71  Temp: 98.5 F (36.9 C)   140 FHR 2 FB Above U Fundal Height Fetal Position uncertain  Assessment: Patient Active Problem List   Diagnosis Date Noted  . Thrombocytopenia 05/26/2013  . High risk pregnancy due to history of previous obstetrical problem 05/12/2013  . PAC (premature atrial contraction) 05/03/2013  . Previous preterm delivery in second trimester, antepartum 04/10/2013  . Unspecified high-risk pregnancy 04/10/2013  . Chronic constipation 01/27/2011  . Anemia 01/27/2011  Shortened cervix on [place]  Plan: Patient to return to clinic in 2 weeks GCT NV Repeat US tomorrow for cervical length Reviewed warning signs in pregnancy. Patient to call with concerns PRN. Reviewed triage location.  20 min spent with patient greater than 80% spent in counseling and coordination of care.   Dylana Shaw Wilson Singer CNM

## 2013-08-02 ENCOUNTER — Other Ambulatory Visit: Payer: Self-pay | Admitting: Obstetrics & Gynecology

## 2013-08-02 ENCOUNTER — Encounter (HOSPITAL_COMMUNITY): Payer: Self-pay

## 2013-08-02 ENCOUNTER — Ambulatory Visit (HOSPITAL_COMMUNITY)
Admission: RE | Admit: 2013-08-02 | Discharge: 2013-08-02 | Disposition: A | Discharge status: Home / Self Care | Source: Ambulatory Visit | Attending: Obstetrics & Gynecology | Admitting: Obstetrics & Gynecology

## 2013-08-02 VITALS — BP 130/80 | HR 67 | Wt 211.5 lb

## 2013-08-02 DIAGNOSIS — O09891 Supervision of other high risk pregnancies, first trimester: Secondary | ICD-10-CM

## 2013-08-02 DIAGNOSIS — Z8751 Personal history of pre-term labor: Secondary | ICD-10-CM | POA: Insufficient documentation

## 2013-08-02 DIAGNOSIS — D693 Immune thrombocytopenic purpura: Secondary | ICD-10-CM | POA: Insufficient documentation

## 2013-08-02 DIAGNOSIS — O0990 Supervision of high risk pregnancy, unspecified, unspecified trimester: Secondary | ICD-10-CM

## 2013-08-02 DIAGNOSIS — D696 Thrombocytopenia, unspecified: Secondary | ICD-10-CM

## 2013-08-02 DIAGNOSIS — O09212 Supervision of pregnancy with history of pre-term labor, second trimester: Secondary | ICD-10-CM

## 2013-08-02 DIAGNOSIS — D689 Coagulation defect, unspecified: Secondary | ICD-10-CM | POA: Insufficient documentation

## 2013-08-03 ENCOUNTER — Other Ambulatory Visit: Payer: Self-pay | Admitting: Advanced Practice Midwife

## 2013-08-03 ENCOUNTER — Encounter: Payer: Self-pay | Admitting: Advanced Practice Midwife

## 2013-08-03 DIAGNOSIS — O26872 Cervical shortening, second trimester: Secondary | ICD-10-CM

## 2013-08-03 DIAGNOSIS — O26879 Cervical shortening, unspecified trimester: Secondary | ICD-10-CM | POA: Insufficient documentation

## 2013-08-08 ENCOUNTER — Encounter: Payer: Self-pay | Admitting: Advanced Practice Midwife

## 2013-08-08 LAB — US OB DETAIL + 14 WK

## 2013-08-14 ENCOUNTER — Encounter: Payer: Self-pay | Admitting: Obstetrics & Gynecology

## 2013-08-14 LAB — US OB DETAIL + 14 WK

## 2013-08-15 ENCOUNTER — Encounter: Payer: Self-pay | Admitting: Obstetrics

## 2013-08-15 ENCOUNTER — Encounter: Admitting: Advanced Practice Midwife

## 2013-08-15 ENCOUNTER — Ambulatory Visit (INDEPENDENT_AMBULATORY_CARE_PROVIDER_SITE_OTHER): Admitting: Obstetrics

## 2013-08-15 ENCOUNTER — Other Ambulatory Visit: Payer: Non-veteran care

## 2013-08-15 VITALS — BP 113/78 | Temp 98.2°F | Wt 213.0 lb

## 2013-08-15 DIAGNOSIS — Z3402 Encounter for supervision of normal first pregnancy, second trimester: Secondary | ICD-10-CM

## 2013-08-15 DIAGNOSIS — Z3400 Encounter for supervision of normal first pregnancy, unspecified trimester: Secondary | ICD-10-CM

## 2013-08-15 LAB — POCT URINALYSIS DIPSTICK
Bilirubin, UA: NEGATIVE
Blood, UA: NEGATIVE
Glucose, UA: NEGATIVE
Leukocytes, UA: NEGATIVE
Nitrite, UA: NEGATIVE
Spec Grav, UA: <=1.005
Urobilinogen, UA: NEGATIVE

## 2013-08-15 LAB — CBC
MCH: 29.8 pg (ref 26.0–34.0)
MCHC: 33.2 g/dL (ref 30.0–36.0)
MCV: 89.6 fL (ref 78.0–100.0)
Platelets: 102 10*3/uL — ABNORMAL LOW (ref 150–400)
RBC: 3.86 MIL/uL — ABNORMAL LOW (ref 3.87–5.11)
RDW: 13.8 % (ref 11.5–15.5)

## 2013-08-15 NOTE — Progress Notes (Signed)
Prometrium 200 mg tablet- insert intravaginally at bedtime called to patient's pharmacy per Dr Jean Rosenthal Moore's order.

## 2013-08-15 NOTE — Progress Notes (Signed)
HR - 73 Pt in office for routine OB visit, denies concerns at this time

## 2013-08-16 ENCOUNTER — Encounter: Admitting: Advanced Practice Midwife

## 2013-08-16 LAB — HIV ANTIBODY (ROUTINE TESTING W REFLEX): HIV: NONREACTIVE

## 2013-08-16 LAB — GLUCOSE TOLERANCE, 2 HOURS W/ 1HR
Glucose, 1 hour: 101 mg/dL (ref 70–170)
Glucose, 2 hour: 84 mg/dL (ref 70–139)
Glucose, Fasting: 66 mg/dL — ABNORMAL LOW (ref 70–99)

## 2013-08-16 LAB — RPR

## 2013-08-17 ENCOUNTER — Encounter: Payer: Self-pay | Admitting: Advanced Practice Midwife

## 2013-08-21 ENCOUNTER — Encounter: Payer: Self-pay | Admitting: Obstetrics & Gynecology

## 2013-08-22 ENCOUNTER — Other Ambulatory Visit: Payer: Self-pay | Admitting: Obstetrics & Gynecology

## 2013-08-22 ENCOUNTER — Encounter: Payer: Self-pay | Admitting: Advanced Practice Midwife

## 2013-08-22 ENCOUNTER — Institutional Professional Consult (permissible substitution): Payer: Non-veteran care

## 2013-08-22 DIAGNOSIS — Z8751 Personal history of pre-term labor: Secondary | ICD-10-CM

## 2013-08-23 ENCOUNTER — Ambulatory Visit (HOSPITAL_COMMUNITY)

## 2013-08-23 ENCOUNTER — Encounter: Payer: Self-pay | Admitting: Obstetrics & Gynecology

## 2013-08-29 ENCOUNTER — Ambulatory Visit (INDEPENDENT_AMBULATORY_CARE_PROVIDER_SITE_OTHER): Admitting: Advanced Practice Midwife

## 2013-08-29 VITALS — BP 130/80 | Temp 98.3°F | Wt 216.0 lb

## 2013-08-29 DIAGNOSIS — D696 Thrombocytopenia, unspecified: Secondary | ICD-10-CM

## 2013-08-29 DIAGNOSIS — Z3402 Encounter for supervision of normal first pregnancy, second trimester: Secondary | ICD-10-CM

## 2013-08-29 DIAGNOSIS — O0990 Supervision of high risk pregnancy, unspecified, unspecified trimester: Secondary | ICD-10-CM

## 2013-08-29 DIAGNOSIS — Z3400 Encounter for supervision of normal first pregnancy, unspecified trimester: Secondary | ICD-10-CM

## 2013-08-29 DIAGNOSIS — O09212 Supervision of pregnancy with history of pre-term labor, second trimester: Secondary | ICD-10-CM

## 2013-08-29 DIAGNOSIS — O0992 Supervision of high risk pregnancy, unspecified, second trimester: Secondary | ICD-10-CM

## 2013-08-29 DIAGNOSIS — O09219 Supervision of pregnancy with history of pre-term labor, unspecified trimester: Secondary | ICD-10-CM

## 2013-08-29 LAB — CBC
Hemoglobin: 11.1 g/dL — ABNORMAL LOW (ref 12.0–15.0)
MCH: 30.4 pg (ref 26.0–34.0)
MCHC: 33.7 g/dL (ref 30.0–36.0)
MCV: 90.1 fL (ref 78.0–100.0)
Platelets: 116 10*3/uL — ABNORMAL LOW (ref 150–400)
RBC: 3.65 MIL/uL — ABNORMAL LOW (ref 3.87–5.11)

## 2013-08-29 LAB — POCT URINALYSIS DIPSTICK
Bilirubin, UA: NEGATIVE
Blood, UA: NEGATIVE
Ketones, UA: NEGATIVE
Nitrite, UA: NEGATIVE
Spec Grav, UA: <=1.005
Urobilinogen, UA: NEGATIVE
pH, UA: 7

## 2013-08-29 NOTE — Progress Notes (Signed)
Pulse 93 Pt is using progesterone cream at night and has recently had some cramping. Pt is curious to know what signs may lead to preterm labor.

## 2013-08-29 NOTE — Progress Notes (Signed)
Subjective: Cheryl [name] is a 27 y.o. at 27 weeks by LMP  Patient denies vaginal leaking of fluid or bleeding, denies contractions.  Reports positive fetal movment.  Denies concerns today. Denies HA, RUQ pain or vision changes.  Over the weekend she had low pelvic pain last less than a minute x3. Denies any current concerns.  Objective: Filed Vitals:   08/29/13 1346  BP: 130/80  Temp:    140 FHR 27 Fundal Height Fetal Position unknown  Negative Protein  Assessment: Patient Active Problem List   Diagnosis Date Noted  . Cervix, short (affecting pregnancy) 08/03/2013  . Thrombocytopenia 05/26/2013  . High risk pregnancy due to history of previous obstetrical problem 05/12/2013  . PAC (premature atrial contraction) 05/03/2013  . Previous preterm delivery in second trimester, antepartum 04/10/2013  . Unspecified high-risk pregnancy 04/10/2013  . Chronic constipation 01/27/2011  . Anemia 01/27/2011    Plan: Patient to return to clinic in 2 weeks CBC today to check platelets MFM visit next week Repeat US on 29th Cont to monitor closely Reviewed warning signs in pregnancy. Patient to call with concerns PRN. Reviewed triage location.  15 min spent with patient greater than 80% spent in counseling and coordination of care.    Aithan Farrelly Wilson Singer CNM

## 2013-09-01 ENCOUNTER — Encounter (HOSPITAL_COMMUNITY): Payer: Self-pay

## 2013-09-01 ENCOUNTER — Inpatient Hospital Stay (HOSPITAL_COMMUNITY)
Admission: AD | Admit: 2013-09-01 | Discharge: 2013-09-02 | Disposition: A | Discharge status: Home / Self Care | Source: Ambulatory Visit | Attending: Obstetrics & Gynecology | Admitting: Obstetrics & Gynecology

## 2013-09-01 DIAGNOSIS — R109 Unspecified abdominal pain: Secondary | ICD-10-CM | POA: Insufficient documentation

## 2013-09-01 DIAGNOSIS — O479 False labor, unspecified: Secondary | ICD-10-CM

## 2013-09-01 DIAGNOSIS — O4700 False labor before 37 completed weeks of gestation, unspecified trimester: Secondary | ICD-10-CM | POA: Insufficient documentation

## 2013-09-01 LAB — URINE MICROSCOPIC-ADD ON

## 2013-09-01 LAB — URINALYSIS, ROUTINE W REFLEX MICROSCOPIC
Bilirubin Urine: NEGATIVE
Glucose, UA: NEGATIVE mg/dL
Ketones, ur: NEGATIVE mg/dL
Nitrite: NEGATIVE
Protein, ur: NEGATIVE mg/dL
Urobilinogen, UA: 0.2 mg/dL (ref 0.0–1.0)
pH: 6 (ref 5.0–8.0)

## 2013-09-01 LAB — WET PREP, GENITAL: Clue Cells Wet Prep HPF POC: NONE SEEN

## 2013-09-01 NOTE — MAU Note (Signed)
Feels "cramps" a few times a day since last week. Last felt a cramp around 8pm tonight. Denies VB or LOF. Positive fetal movement. 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 Has shortened cervix with this pregnancy (1.5cm a few weeks ago).

## 2013-09-01 NOTE — MAU Provider Note (Signed)
History     CSN: [id number]  Arrival date and time: 09/01/13 2201   First Provider Initiated Contact with Patient 09/01/13 2247      Chief Complaint  Patient presents with  . Abdominal Cramping   HPI  NAYANA LENIG is a 27 y.o. [id number] at [redacted]w[redacted]d who presents today with cramping. She states that she has one every couple of hours. She denies any VB or LOF. She states that the baby has been moving normally. She has been using vaginal progesterone. She used it last night around 2200. She has not had her cervix checked in the office, but has had an ultrasound with a shortened cervical length. She last had intercourse about a month ago.   Past Medical History  Diagnosis Date  . Anemia   . HELLP syndrome   . Preterm labor   . Pregnancy induced hypertension     Past Surgical History  Procedure Laterality Date  . None    . No past surgeries      Family History  Problem Relation Age of Onset  . Cancer Maternal Grandmother     Bladder  . Diabetes Maternal Grandmother   . Diabetes Maternal Grandfather   . Diabetes Mother     History  Substance Use Topics  . Smoking status: Never Smoker   . Smokeless tobacco: Never Used  . Alcohol Use: No     Comment: Rarely    Allergies:  Allergies  Allergen Reactions  . Amoxicillin Rash and Swelling  . Flagyl [Metronidazole] Swelling and Rash  . Latex Rash  . Penicillins Swelling and Rash    Prescriptions prior to admission  Medication Sig Dispense Refill  . aspirin EC 81 MG tablet Take 81 mg by mouth.      . Iron 15 MG/1.5ML SUSP Take by mouth.      . Prenatal Vit-Fe Fumarate-FA (MULTIVITAMIN-PRENATAL) 27-0.8 MG TABS tablet Take by mouth.      . progesterone 200 MG SUPP Place 200 mg vaginally at bedtime.      . diphenhydrAMINE (BENADRYL) 25 mg capsule Take 50 mg by mouth every 6 (six) hours as needed for itching.        ROS Physical Exam   Blood pressure 116/72, pulse 79, temperature 99.1 F (37.3 C), temperature  source Oral, resp. rate 18, height 5\' 8"  (1.727 m), weight 98.34 kg (216 lb 12.8 oz), last menstrual period 02/17/2013.  Physical Exam  Nursing note and vitals reviewed. Constitutional: She is oriented to person, place, and time. She appears well-developed and well-nourished. No distress.  Cardiovascular: Normal rate.   Respiratory: Effort normal.  GI: Soft. There is no tenderness.  Genitourinary:   External: no lesion Vagina: small amount of white discharge Cervix: pink, smooth,closed/thick Uterus: AGA    Neurological: She is alert and oriented to person, place, and time.  Skin: Skin is warm and dry.  Psychiatric: She has a normal mood and affect.  FHT: 140, moderate with 15x15 accels, no decels Toco: no UCs  MAU Course  Procedures  Results for orders placed during the hospital encounter of 09/01/13 (from the past 24 hour(s))  URINALYSIS, ROUTINE W REFLEX MICROSCOPIC     Status: Abnormal   Collection Time    09/01/13 10:22 PM      Result Value Range   Color, Urine YELLOW  YELLOW   APPearance CLEAR  CLEAR   Specific Gravity, Urine 1.010  1.005 - 1.030   pH 6.0  5.0 - 8.0  Glucose, UA NEGATIVE  NEGATIVE mg/dL   Hgb urine dipstick TRACE (*) NEGATIVE   Bilirubin Urine NEGATIVE  NEGATIVE   Ketones, ur NEGATIVE  NEGATIVE mg/dL   Protein, ur NEGATIVE  NEGATIVE mg/dL   Urobilinogen, UA 0.2  0.0 - 1.0 mg/dL   Nitrite NEGATIVE  NEGATIVE   Leukocytes, UA TRACE (*) NEGATIVE  URINE MICROSCOPIC-ADD ON     Status: Abnormal   Collection Time    09/01/13 10:22 PM      Result Value Range   Squamous Epithelial / LPF MANY (*) RARE   WBC, UA 3-6  <3 WBC/hpf   RBC / HPF 0-2  <3 RBC/hpf   Bacteria, UA RARE  RARE  WET PREP, GENITAL     Status: Abnormal   Collection Time    09/01/13 10:56 PM      Result Value Range   Yeast Wet Prep HPF POC NONE SEEN  NONE SEEN   Trich, Wet Prep NONE SEEN  NONE SEEN   Clue Cells Wet Prep HPF POC NONE SEEN  NONE SEEN   WBC, Wet Prep HPF POC FEW (*)  NONE SEEN   2354: C/W Dr. Clearance Coots, ok for DC home. Have her call to office to be seen on Monday.   Assessment and Plan   1. Deberah Pelton contractions    Follow-up Information   Call Roseanna Rainbow, MD. (for an appointment to be seen on [facility] )    Specialty:  Obstetrics and Gynecology   Contact information:   [address] [place] [address] [phone]      PTL danger signs Fetal Kick counts Return to MAU as needed   Tawnya Crook 09/01/2013, 10:48 PM

## 2013-09-05 ENCOUNTER — Institutional Professional Consult (permissible substitution): Payer: Non-veteran care

## 2013-09-11 ENCOUNTER — Encounter: Payer: Self-pay | Admitting: Obstetrics & Gynecology

## 2013-09-12 ENCOUNTER — Encounter: Payer: Self-pay | Admitting: Advanced Practice Midwife

## 2013-09-12 ENCOUNTER — Ambulatory Visit (INDEPENDENT_AMBULATORY_CARE_PROVIDER_SITE_OTHER): Admitting: Advanced Practice Midwife

## 2013-09-12 VITALS — BP 115/71 | Temp 98.6°F | Wt 220.0 lb

## 2013-09-12 DIAGNOSIS — Z3400 Encounter for supervision of normal first pregnancy, unspecified trimester: Secondary | ICD-10-CM

## 2013-09-12 DIAGNOSIS — Z3403 Encounter for supervision of normal first pregnancy, third trimester: Secondary | ICD-10-CM

## 2013-09-12 LAB — POCT URINALYSIS DIPSTICK
Blood, UA: NEGATIVE
Glucose, UA: NEGATIVE
Leukocytes, UA: NEGATIVE
Nitrite, UA: NEGATIVE
Protein, UA: NEGATIVE
Spec Grav, UA: 1.01
Urobilinogen, UA: NEGATIVE
pH, UA: 8

## 2013-09-12 NOTE — Progress Notes (Signed)
HR - 83 Pt in office today for routine OB visit, would like to discuss results of [place] from [facility] and their recommendations

## 2013-09-12 NOTE — Progress Notes (Signed)
Subjective: Cheryl [name] is a 27 y.o. at 29 weeks by LMP  Patient denies vaginal leaking of fluid or bleeding, denies contractions.  Reports positive fetal movment.  Denies concerns today. Patient had meeting yesterday with [facility]. She reports it was early and she cannot clearly remember the recommendations but thinks she needs to be seen every week. Plans repeat US for abdominal circumference.  Denies any HELLP or pre-eclampsia symptoms.  Objective: Filed Vitals:   09/12/13 1423  BP: 115/71  Temp: 98.6 F (37 C)   140 FHR 30 Fundal Height Fetal Position unknown  Assessment: Patient Active Problem List   Diagnosis Date Noted  . High-risk pregnancy 08/29/2013  . Cervix, short (affecting pregnancy) 08/03/2013  . Thrombocytopenia 05/26/2013  . High risk pregnancy due to history of previous obstetrical problem 05/12/2013  . PAC (premature atrial contraction) 05/03/2013  . Previous preterm delivery in second trimester, antepartum 04/10/2013  . Unspecified high-risk pregnancy 04/10/2013  . Chronic constipation 01/27/2011  . Anemia 01/27/2011    Plan: Patient to return to clinic in 1 weeks [facility] consult pending, awaiting recommendations Reviewed warning signs in pregnancy. Patient to call with concerns PRN. Reviewed triage location. Planning repeat US for AC (7% per patient report, previously 4%) Plan CBC in 2-3 weeks for plt count  15 min spent with patient greater than 80% spent in counseling and coordination of care.   Kailee Essman Wilson Singer CNM

## 2013-09-14 NOTE — L&D Delivery Note (Signed)
Delivery Note At 3:11 AM a viable female was delivered via Vaginal, Spontaneous Delivery (Presentation: Occiput Anterior).   Placenta status: Intact, Spontaneous, via Tomasa BlaseSchultz.  Cord: 3 vessels with the following complications: None.  Mild atony with improvement in tone after fundal massage, Pitocin, Methergine.  The bladder was catheterized for 100 ml.  Anesthesia: None  Episiotomy: None Lacerations: 1st degree Suture Repair: 2.0 vicryl rapide Est. Blood Loss (mL): 400 ml  Mom to postpartum.  Baby to Couplet care / Skin to Skin.  JACKSON-MOORE,Wynne Jury A 11/18/2013, 3:55 AM

## 2013-09-18 ENCOUNTER — Encounter: Payer: Self-pay | Admitting: Advanced Practice Midwife

## 2013-09-18 LAB — US OB DETAIL + 14 WK

## 2013-09-19 ENCOUNTER — Ambulatory Visit: Payer: Non-veteran care

## 2013-09-19 ENCOUNTER — Ambulatory Visit (INDEPENDENT_AMBULATORY_CARE_PROVIDER_SITE_OTHER): Admitting: Advanced Practice Midwife

## 2013-09-19 VITALS — BP 120/84 | Temp 98.4°F | Wt 218.0 lb

## 2013-09-19 DIAGNOSIS — Z3403 Encounter for supervision of normal first pregnancy, third trimester: Secondary | ICD-10-CM

## 2013-09-19 DIAGNOSIS — Z3400 Encounter for supervision of normal first pregnancy, unspecified trimester: Secondary | ICD-10-CM

## 2013-09-19 DIAGNOSIS — Z3689 Encounter for other specified antenatal screening: Secondary | ICD-10-CM

## 2013-09-19 DIAGNOSIS — Z36 Encounter for antenatal screening of mother: Secondary | ICD-10-CM

## 2013-09-19 DIAGNOSIS — O139 Gestational [pregnancy-induced] hypertension without significant proteinuria, unspecified trimester: Secondary | ICD-10-CM

## 2013-09-19 LAB — POCT URINALYSIS DIPSTICK
Spec Grav, UA: <=1.005
pH, UA: 7

## 2013-09-19 LAB — COMPREHENSIVE METABOLIC PANEL
ALBUMIN: 3.6 g/dL (ref 3.5–5.2)
ALT: 15 U/L (ref 0–35)
AST: 15 U/L (ref 0–37)
Alkaline Phosphatase: 173 U/L — ABNORMAL HIGH (ref 39–117)
BUN: 9 mg/dL (ref 6–23)
CHLORIDE: 100 meq/L (ref 96–112)
CO2: 23 meq/L (ref 19–32)
CREATININE: 0.65 mg/dL (ref 0.50–1.10)
Calcium: 8.5 mg/dL (ref 8.4–10.5)
Glucose, Bld: 73 mg/dL (ref 70–99)
Potassium: 4.1 mEq/L (ref 3.5–5.3)
Sodium: 133 mEq/L — ABNORMAL LOW (ref 135–145)
Total Bilirubin: 0.3 mg/dL (ref 0.3–1.2)
Total Protein: 6.3 g/dL (ref 6.0–8.3)

## 2013-09-19 LAB — CBC
HCT: 34.5 % — ABNORMAL LOW (ref 36.0–46.0)
Hemoglobin: 11.7 g/dL — ABNORMAL LOW (ref 12.0–15.0)
MCH: 30.2 pg (ref 26.0–34.0)
MCHC: 33.9 g/dL (ref 30.0–36.0)
MCV: 88.9 fL (ref 78.0–100.0)
Platelets: 117 10*3/uL — ABNORMAL LOW (ref 150–400)
RBC: 3.88 MIL/uL (ref 3.87–5.11)
RDW: 13.4 % (ref 11.5–15.5)
WBC: 9.2 10*3/uL (ref 4.0–10.5)

## 2013-09-19 LAB — URIC ACID: URIC ACID, SERUM: 3.1 mg/dL (ref 2.4–7.0)

## 2013-09-19 LAB — LACTATE DEHYDROGENASE: LDH: 161 U/L (ref 94–250)

## 2013-09-19 NOTE — Progress Notes (Signed)
Pt is doing well.  Pt states that she does have occasional cramps. Pt here for MFM consult today. Pulse 87 BP taken are as follows:    168/93, 120/84,  139/84,  124/76

## 2013-09-19 NOTE — Progress Notes (Signed)
Subjective: Beecher McardleMichelle A Lias is a 28 y.o. at 30 weeks by LMP  Patient denies vaginal leaking of fluid or bleeding, denies contractions.  Reports positive fetal movment.  Denies concerns today. Denies HA, RUQ pain or vision changes.  Objective: Filed Vitals:   09/19/13 1501  BP: 120/84  Temp:    140 FHR 30 Fundal Height Fetal Position unknown  Assessment: Patient Active Problem List   Diagnosis Date Noted  . High-risk pregnancy 08/29/2013  . Cervix, short (affecting pregnancy) 08/03/2013  . Thrombocytopenia 05/26/2013  . High risk pregnancy due to history of previous obstetrical problem 05/12/2013  . PAC (premature atrial contraction) 05/03/2013  . Previous preterm delivery in second trimester, antepartum 04/10/2013  . Unspecified high-risk pregnancy 04/10/2013  . Chronic constipation 01/27/2011  . Anemia 01/27/2011   AC <7%, Uterine Dopplers WNL, EFW WNL Reactive NST Elevated BP w/ subsequent normal BP following, PIH labs pending  Plan: NST weekly, then biweekly after 32 weeks MFM US 10/03/2013 MFM consult next week Preeclampsia labs pending, sent stat 24 hour urine next week Patient to return to clinic in 1 weeks Reviewed warning signs in pregnancy. Patient to call with concerns PRN. Reviewed triage location.  20 min spent with patient greater than 80% spent in counseling and coordination of care.    Glorian Mcdonell Wilson SingerWren CNM

## 2013-09-20 LAB — PROTEIN / CREATININE RATIO, URINE
Creatinine, Urine: 55.6 mg/dL
Total Protein, Urine: <3 mg/dL

## 2013-09-26 ENCOUNTER — Institutional Professional Consult (permissible substitution): Payer: Non-veteran care

## 2013-09-26 ENCOUNTER — Ambulatory Visit (INDEPENDENT_AMBULATORY_CARE_PROVIDER_SITE_OTHER): Admitting: Advanced Practice Midwife

## 2013-09-26 VITALS — BP 133/80 | Temp 99.2°F | Wt 219.0 lb

## 2013-09-26 DIAGNOSIS — Z3480 Encounter for supervision of other normal pregnancy, unspecified trimester: Secondary | ICD-10-CM

## 2013-09-26 DIAGNOSIS — Z3400 Encounter for supervision of normal first pregnancy, unspecified trimester: Secondary | ICD-10-CM

## 2013-09-26 LAB — POCT URINALYSIS DIPSTICK
Bilirubin, UA: NEGATIVE
Blood, UA: NEGATIVE
Glucose, UA: NEGATIVE
Ketones, UA: NEGATIVE
Leukocytes, UA: NEGATIVE
Nitrite, UA: NEGATIVE
PH UA: 6.5
SPEC GRAV UA: 1.015
UROBILINOGEN UA: NEGATIVE

## 2013-09-26 NOTE — Progress Notes (Signed)
Pulse: 82 Patient states she has no concerns.

## 2013-09-26 NOTE — Progress Notes (Signed)
Subjective: Cheryl [name] is a 28 y.o. at 31 weeks by LMP  Patient denies vaginal leaking of fluid or bleeding, denies contractions.  Reports positive fetal movment.  Denies concerns today.  Objective: Filed Vitals:   09/26/13 1624  BP: 133/80  Temp: 99.2 F (37.3 C)   140 FHR 31 Fundal Height Fetal Position uncertain  Assessment: Patient Active Problem List   Diagnosis Date Noted  . High-risk pregnancy 08/29/2013  . Cervix, short (affecting pregnancy) 08/03/2013  . Thrombocytopenia 05/26/2013  . High risk pregnancy due to history of previous obstetrical problem 05/12/2013  . PAC (premature atrial contraction) 05/03/2013  . Previous preterm delivery in second trimester, antepartum 04/10/2013  . Unspecified high-risk pregnancy 04/10/2013  . Chronic constipation 01/27/2011  . Anemia 01/27/2011    Plan: Patient to return to clinic in Friday, NST at that time.  MFM appt next week.  Patient aware of warning signs in pregnancy. Repeat US 1/20. Reviewed warning signs in pregnancy. Patient to call with concerns PRN. Reviewed triage location.  15 min spent with patient greater than 80% spent in counseling and coordination of care.  Estephani Popper Cheryl [name] CNM

## 2013-09-27 ENCOUNTER — Encounter (HOSPITAL_COMMUNITY): Payer: Self-pay | Admitting: *Deleted

## 2013-09-27 ENCOUNTER — Inpatient Hospital Stay (HOSPITAL_COMMUNITY)
Admission: AD | Admit: 2013-09-27 | Discharge: 2013-09-27 | Disposition: A | Discharge status: Home / Self Care | Source: Ambulatory Visit | Attending: Obstetrics & Gynecology | Admitting: Obstetrics & Gynecology

## 2013-09-27 DIAGNOSIS — O09299 Supervision of pregnancy with other poor reproductive or obstetric history, unspecified trimester: Secondary | ICD-10-CM

## 2013-09-27 DIAGNOSIS — I440 Atrioventricular block, first degree: Secondary | ICD-10-CM | POA: Insufficient documentation

## 2013-09-27 DIAGNOSIS — R0602 Shortness of breath: Secondary | ICD-10-CM | POA: Insufficient documentation

## 2013-09-27 DIAGNOSIS — O99891 Other specified diseases and conditions complicating pregnancy: Secondary | ICD-10-CM | POA: Insufficient documentation

## 2013-09-27 DIAGNOSIS — O9989 Other specified diseases and conditions complicating pregnancy, childbirth and the puerperium: Principal | ICD-10-CM

## 2013-09-27 DIAGNOSIS — R002 Palpitations: Secondary | ICD-10-CM | POA: Insufficient documentation

## 2013-09-27 DIAGNOSIS — O09899 Supervision of other high risk pregnancies, unspecified trimester: Secondary | ICD-10-CM | POA: Insufficient documentation

## 2013-09-27 LAB — URINALYSIS, ROUTINE W REFLEX MICROSCOPIC
Bilirubin Urine: NEGATIVE
Glucose, UA: NEGATIVE mg/dL
KETONES UR: NEGATIVE mg/dL
LEUKOCYTES UA: NEGATIVE
Nitrite: NEGATIVE
PH: 6.5 (ref 5.0–8.0)
Protein, ur: NEGATIVE mg/dL
Specific Gravity, Urine: 1.015 (ref 1.005–1.030)
Urobilinogen, UA: 0.2 mg/dL (ref 0.0–1.0)

## 2013-09-27 LAB — URINE MICROSCOPIC-ADD ON

## 2013-09-27 NOTE — MAU Note (Signed)
Woke up,,went to bathroom. Felt like heart was pounding, SOB. Took a shower. Continued to feel heart pounding. Never occurred before.

## 2013-09-27 NOTE — MAU Note (Signed)
Patient states she did see a cardiologist earlier in pregnancy due to "low" heart rate in office. States she wore home monitor for assessment.

## 2013-09-27 NOTE — Discharge Instructions (Signed)
Supraventricular Tachycardia °Supraventricular tachycardia (SVT) is an abnormal heart rhythm (arrhythmia) that causes the heart to beat very fast (tachycardia). This kind of fast heartbeat originates in the upper chambers of the heart (atria). SVT can cause the heart to beat greater than 100 beats per minute. SVT can have a rapid burst of heartbeats. This can start and stop suddenly without warning and is called nonsustained. SVT can also be sustained, in which the heart beats at a continuous fast rate.  °CAUSES  °There can be different causes of SVT. Some of these include: °· Heart valve problems such as mitral valve prolapse. °· An enlarged heart (hypertrophic cardiomyopathy). °· Congenital heart problems. °· Heart inflammation (pericarditis). °· Hyperthyroidism. °· Low potassium or magnesium levels. °· Caffeine. °· Drug use such as cocaine, methamphetamines, or stimulants. °· Some over-the-counter medicines such as: °· Decongestants. °· Diet medicines. °· Herbal medicines. °SYMPTOMS  °Symptoms of SVT can vary. Symptoms depend on whether the SVT is sustained or nonsustained. You may experience: °· No symptoms (asymptomatic). °· An awareness of your heart beating rapidly (palpitations). °· Shortness of breath. °· Chest pain or pressure. °If your blood pressure drops because of the SVT, you may experience: °· Fainting or near fainting. °· Weakness. °· Dizziness. °DIAGNOSIS  °Different tests can be performed to diagnose SVT, such as: °· An electrocardiogram (EKG). This is a painless test that records the electrical activity of your heart. °· Holter monitor. This is a 24 hour recording of your heart rhythm. You will be given a diary. Write down all symptoms that you have and what you were doing at the time you experienced symptoms. °· Arrhythmia monitor. This is a small device that your wear for several weeks. It records the heart rhythm when you have symptoms. °· Echocardiogram. This is an imaging test to help detect  abnormal heart structure such as congenital abnormalities, heart valve problems, or heart enlargement. °· Stress test. This test can help determine if the SVT is related to exercise. °· Electrophysiology study (EPS). This is a procedure that evaluates your heart's electrical system and can help your caregiver find the cause of your SVT. °TREATMENT  °Treatment of SVT depends on the symptoms, how often it recurs, and whether there are any underlying heart problems.  °· If symptoms are rare and no other cardiac disease is present, no treatment may be needed. °· Blood work may be done to check potassium, magnesium, and thyroid hormone levels to see if they are abnormal. If these levels are abnormal, treatment to correct the problems will occur. °Medicines °Your caregiver may use oral medicines to treat SVT. These medicines are given for long-term control of SVT. Medicines may be used alone or in combination with other treatments. These medicines work to slow nerve impulses in the heart muscle. These medicines can also be used to treat high blood pressure. Some of these medicines may include: °· Calcium channel blockers. °· Beta blockers. °· Digoxin. °Nonsurgical procedures °Nonsurgical techniques may be used if oral medicines do not work. Some examples include: °· Cardioversion. This technique uses either drugs or an electrical shock to restore a normal heart rhythm. °· Cardioversion drugs may be given through an intravenous (IV) line to help "reset" the heart rhythm. °· In electrical cardioversion, the caregiver shocks your heart to stop its beat for a split second. This helps to reset the heart to a normal rhythm. °· Ablation. This procedure is done under mild sedation. High frequency radio wave energy is used to   destroy the area of heart tissue responsible for the SVT. °HOME CARE INSTRUCTIONS  °· Do not smoke. °· Only take medicines prescribed by your caregiver. Check with your caregiver before using over-the-counter  medicines. °· Check with your caregiver about how much alcohol and caffeine (coffee, tea, colas, or chocolate) you may have. °· It is very important to keep all follow-up referrals and appointments in order to properly manage this problem. °SEEK IMMEDIATE MEDICAL CARE IF: °· You have dizziness. °· You faint or nearly faint. °· You have shortness of breath. °· You have chest pain or pressure. °· You have sudden nausea or vomiting. °· You have profuse sweating. °· You are concerned about how long your symptoms last. °· You are concerned about the frequency of your SVT episodes. °If you have the above symptoms, call your local emergency services (911 in [place] immediately. Do not drive yourself to the hospital. °MAKE SURE YOU:  °· Understand these instructions. °· Will watch your condition. °· Will get help right away if you are not doing well or get worse. °Document Released: 08/31/2005 Document Revised: 11/23/2011 Document Reviewed: 12/13/2008 °[employer]® Patient Information ©2014 [employer]. ° °

## 2013-09-27 NOTE — MAU Provider Note (Signed)
Chief Complaint:  Shortness of Breath and Palpitations   First Provider Initiated Contact with Patient 09/27/13 0910      HPI: Cheryl [name] is a 28 y.o. [id number] at 36w5dwho presents to maternity admissions reporting single episode of feeling like her heart was pounding, palpitations and shortness of breath when getting out of bed this morning.  The symptoms resolved and she denies symptoms currently.  She was seen by cardiology earlier in this pregnancy r/t low heart rate in office and was found to have PACs but was cleared without further plan to see cardiology.  She reports good fetal movement, denies contractions, LOF, vaginal bleeding, vaginal itching/burning, urinary symptoms, h/a, dizziness, n/v, or fever/chills.     Past Medical History: Past Medical History  Diagnosis Date  . Anemia   . HELLP syndrome   . Preterm labor   . Pregnancy induced hypertension     Past obstetric history: OB History  Gravida Para Term Preterm AB SAB TAB Ectopic Multiple Living  4 1 0 1 2 1 1 0 0 0     # Outcome Date GA Lbr Len/2nd Weight Sex Delivery Anes PTL Lv  4 CUR           3 SAB 10/15/12 [redacted]w[redacted]d         2 PRE 08/01/07 [redacted]w[redacted]d    SVD   SB     Comments: HELP Syndrome  1 TAB               Past Surgical History: Past Surgical History  Procedure Laterality Date  . None    . No past surgeries      Family History: Family History  Problem Relation Age of Onset  . Cancer Maternal Grandmother     Bladder  . Diabetes Maternal Grandmother   . Diabetes Maternal Grandfather   . Diabetes Mother     Social History: History  Substance Use Topics  . Smoking status: Never Smoker   . Smokeless tobacco: Never Used  . Alcohol Use: No     Comment: Rarely    Allergies:  Allergies  Allergen Reactions  . Amoxicillin Rash and Swelling  . Flagyl [Metronidazole] Swelling and Rash  . Latex Rash  . Penicillins Swelling and Rash    Meds:  No prescriptions prior to admission    ROS:  Pertinent findings in history of present illness.  Physical Exam  Blood pressure 118/73, pulse 86, temperature 98.7 F (37.1 C), temperature source Oral, resp. rate 18, height 5\' 8"  (1.727 m), weight 99.791 kg (220 lb), last menstrual period 02/17/2013, SpO2 100.00%. GENERAL: Well-developed, well-nourished female in no acute distress.  HEENT: normocephalic HEART: normal rate RESP: normal effort ABDOMEN: Soft, non-tender, gravid appropriate for gestational age EXTREMITIES: Nontender, no edema NEURO: alert and oriented    FHT:  Baseline 135, moderate variability, accelerations present, no decelerations Contractions: None on toco or to palpation   Labs: Results for orders placed during the hospital encounter of 09/27/13 (from the past 24 hour(s))  URINALYSIS, ROUTINE W REFLEX MICROSCOPIC     Status: Abnormal   Collection Time    09/27/13  8:31 AM      Result Value Range   Color, Urine YELLOW  YELLOW   APPearance CLEAR  CLEAR   Specific Gravity, Urine 1.015  1.005 - 1.030   pH 6.5  5.0 - 8.0   Glucose, UA NEGATIVE  NEGATIVE mg/dL   Hgb urine dipstick TRACE (*) NEGATIVE   Bilirubin Urine NEGATIVE  NEGATIVE  Ketones, ur NEGATIVE  NEGATIVE mg/dL   Protein, ur NEGATIVE  NEGATIVE mg/dL   Urobilinogen, UA 0.2  0.0 - 1.0 mg/dL   Nitrite NEGATIVE  NEGATIVE   Leukocytes, UA NEGATIVE  NEGATIVE  URINE MICROSCOPIC-ADD ON     Status: Abnormal   Collection Time    09/27/13  8:31 AM      Result Value Range   Squamous Epithelial / LPF FEW (*) RARE   RBC / HPF 0-2  <3 RBC/hpf   Bacteria, UA RARE  RARE    Imaging:  EKG 09/27/13 Faythe GheeDMAN, Shaliah ZO:[id number] 27-Sep-2013 09:11:01 [facility] PED  Sinus rhythm with 1st degree A-V block Cannot rule out Anterior infarct , age undetermined Abnormal ECG 5425mm/s 7410mm/mV 150Hz  8.0.1 12SL 241 HD CID: 0 Referred by: L.JACKSONMOORE Unconfirmed Vent. rate 80 BPM PR interval 220 ms QRS duration 82 ms QT/QTc 350/403 ms P-R-T axes 22  22 10  15-Feb-1986 (27 yr) Female Black Room:MAU10 Loc:800 Technician: E.SNYDER,RRT Test JWJ:[id number]:PALPITATIONS Page 1 of 1 EID: EDT: ORDER:   Assessment: 1. Intermittent palpitations   2. Pounding heartbeat   3. High risk pregnancy due to history of previous obstetrical problem     Plan: Consult Cardiologist on call--normal EKG r/t pt age, no follow up recommended at this time Consult Dr Tamela OddiJackson-Moore Discharge home Call office or return to MAU if symptoms worsen or persist      Follow-up Information   Follow up with Roseanna RainbowJACKSON-MOORE,Suellen Durocher A, MD. (Return to MAU as needed)    Specialty:  Obstetrics and Gynecology   Contact information:   [address] [place] [address] [phone]        Medication List         aspirin EC 81 MG tablet  Take 81 mg by mouth.     Iron 15 MG/1.5ML Susp  Take 20 mLs by mouth daily.     multivitamin-prenatal 27-0.8 MG Tabs tablet  Take by mouth.     progesterone 200 MG Supp  Place 200 mg vaginally at bedtime.        Sharen CounterLisa Leftwich-[name] Certified Nurse-Midwife 09/27/2013 10:38 AM

## 2013-09-29 ENCOUNTER — Ambulatory Visit: Payer: Non-veteran care

## 2013-09-29 DIAGNOSIS — Z3400 Encounter for supervision of normal first pregnancy, unspecified trimester: Secondary | ICD-10-CM

## 2013-09-29 DIAGNOSIS — O139 Gestational [pregnancy-induced] hypertension without significant proteinuria, unspecified trimester: Secondary | ICD-10-CM

## 2013-09-29 NOTE — Progress Notes (Unsigned)
Pulse 73 Pt in office today for Fetal Non-Stress Test.

## 2013-10-03 ENCOUNTER — Institutional Professional Consult (permissible substitution): Payer: Non-veteran care

## 2013-10-03 ENCOUNTER — Encounter: Payer: Self-pay | Admitting: Advanced Practice Midwife

## 2013-10-03 ENCOUNTER — Encounter: Payer: Self-pay | Admitting: Obstetrics & Gynecology

## 2013-10-03 ENCOUNTER — Ambulatory Visit (INDEPENDENT_AMBULATORY_CARE_PROVIDER_SITE_OTHER): Admitting: Advanced Practice Midwife

## 2013-10-03 ENCOUNTER — Encounter: Admitting: Advanced Practice Midwife

## 2013-10-03 VITALS — BP 138/82 | Temp 98.6°F | Wt 225.0 lb

## 2013-10-03 DIAGNOSIS — Z3480 Encounter for supervision of other normal pregnancy, unspecified trimester: Secondary | ICD-10-CM

## 2013-10-03 DIAGNOSIS — Z3400 Encounter for supervision of normal first pregnancy, unspecified trimester: Secondary | ICD-10-CM

## 2013-10-03 LAB — POCT URINALYSIS DIPSTICK
BILIRUBIN UA: NEGATIVE
GLUCOSE UA: NEGATIVE
Ketones, UA: NEGATIVE
Leukocytes, UA: NEGATIVE
NITRITE UA: NEGATIVE
PH UA: 7
Protein, UA: NEGATIVE
RBC UA: NEGATIVE
SPEC GRAV UA: 1.01
Urobilinogen, UA: NEGATIVE

## 2013-10-03 LAB — US OB DETAIL + 14 WK

## 2013-10-03 NOTE — Progress Notes (Signed)
Pulse 77, patient states she has no concerns today. [facility]. MFM today in office.

## 2013-10-03 NOTE — Progress Notes (Signed)
Subjective: Cheryl [name] is a 28 y.o. at 32 weeks by LMP  Patient denies vaginal leaking of fluid or bleeding, denies contractions.  Reports positive fetal movment.  Denies concerns today.  Objective: Filed Vitals:   10/03/13 1259  BP: 138/82  Temp: 98.6 F (37 C)   140 FHR 32 Fundal Height Fetal Position cephalic  Assessment: Patient Active Problem List   Diagnosis Date Noted  . High-risk pregnancy 08/29/2013  . Cervix, short (affecting pregnancy) 08/03/2013  . Thrombocytopenia 05/26/2013  . High risk pregnancy due to history of previous obstetrical problem 05/12/2013  . PAC (premature atrial contraction) 05/03/2013  . Previous preterm delivery in second trimester, antepartum 04/10/2013  . Unspecified high-risk pregnancy 04/10/2013  . Chronic constipation 01/27/2011  . Anemia 01/27/2011    Plan: Patient to return to clinic in 1 weeks MFM consult today, results pending TDAP to be given in upcoming visits Patient told she could do NST weekly at this time. Reviewed warning signs in pregnancy. Patient to call with concerns PRN. Reviewed triage location.  20 min spent with patient greater than 80% spent in counseling and coordination of care.   Cheryl [name] CNM

## 2013-10-06 ENCOUNTER — Encounter: Payer: Self-pay | Admitting: Obstetrics & Gynecology

## 2013-10-09 ENCOUNTER — Encounter: Payer: Self-pay | Admitting: Obstetrics & Gynecology

## 2013-10-10 ENCOUNTER — Ambulatory Visit (INDEPENDENT_AMBULATORY_CARE_PROVIDER_SITE_OTHER): Admitting: Advanced Practice Midwife

## 2013-10-10 VITALS — BP 137/81 | Temp 97.9°F | Wt 222.0 lb

## 2013-10-10 DIAGNOSIS — Z3480 Encounter for supervision of other normal pregnancy, unspecified trimester: Secondary | ICD-10-CM

## 2013-10-10 DIAGNOSIS — O139 Gestational [pregnancy-induced] hypertension without significant proteinuria, unspecified trimester: Secondary | ICD-10-CM

## 2013-10-10 DIAGNOSIS — Z3400 Encounter for supervision of normal first pregnancy, unspecified trimester: Secondary | ICD-10-CM

## 2013-10-10 LAB — POCT URINALYSIS DIPSTICK
BILIRUBIN UA: NEGATIVE
Blood, UA: NEGATIVE
Glucose, UA: NEGATIVE
Ketones, UA: NEGATIVE
LEUKOCYTES UA: NEGATIVE
NITRITE UA: NEGATIVE
PH UA: 7
Protein, UA: NEGATIVE
Spec Grav, UA: 1.015
Urobilinogen, UA: NEGATIVE

## 2013-10-10 MED ORDER — TETANUS-DIPHTH-ACELL PERTUSSIS 5-2.5-18.5 LF-MCG/0.5 IM SUSP
0.5000 mL | Freq: Once | INTRAMUSCULAR | Status: DC
Start: 2013-10-10 — End: 2013-11-07

## 2013-10-10 NOTE — Progress Notes (Signed)
Pulse: 85 Patient denies any concerns.

## 2013-10-11 ENCOUNTER — Encounter: Admitting: Advanced Practice Midwife

## 2013-10-11 NOTE — Progress Notes (Signed)
Subjective: Cheryl [name] is a 28 y.o. at 33 weeks by LMP  Patient denies vaginal leaking of fluid or bleeding, denies contractions.  Reports positive fetal movment.  Denies concerns today. Denies HA, RUQ pain or vision change.  Objective: Filed Vitals:   10/10/13 1532  BP: 137/81  Temp: 97.9 F (36.6 C)   130 FHR 33 Fundal Height Fetal Position cephalic  NST 135 + accel, negative decel, mod variability TOCO Negative  Assessment: Patient Active Problem List   Diagnosis Date Noted  . Small for gestational age 59/23/2015  . High-risk pregnancy 08/29/2013  . Cervix, short (affecting pregnancy) 08/03/2013  . Thrombocytopenia 05/26/2013  . High risk pregnancy due to history of previous obstetrical problem 05/12/2013  . PAC (premature atrial contraction) 05/03/2013  . Previous preterm delivery in second trimester, antepartum 04/10/2013  . Unspecified high-risk pregnancy 04/10/2013  . Chronic constipation 01/27/2011  . Anemia 01/27/2011  Category 1, reactive NST  Plan: Patient to return to clinic in 1 weeks Reviewed warning signs in pregnancy. Patient to call with concerns PRN. Reviewed triage location. MFM appt and US scheduled, patient aware of dates and times. Reviewed warning signs in pregnancy. Plan CBC NV.  20 min spent with patient greater than 80% spent in counseling and coordination of care.   Kaziyah Parkison Wilson SingerWren CNM

## 2013-10-17 ENCOUNTER — Encounter: Payer: Self-pay | Admitting: Advanced Practice Midwife

## 2013-10-17 ENCOUNTER — Ambulatory Visit (INDEPENDENT_AMBULATORY_CARE_PROVIDER_SITE_OTHER): Payer: PRIVATE HEALTH INSURANCE | Admitting: Advanced Practice Midwife

## 2013-10-17 ENCOUNTER — Institutional Professional Consult (permissible substitution)

## 2013-10-17 VITALS — BP 129/82 | Temp 98.1°F | Wt 221.0 lb

## 2013-10-17 DIAGNOSIS — O139 Gestational [pregnancy-induced] hypertension without significant proteinuria, unspecified trimester: Secondary | ICD-10-CM

## 2013-10-17 DIAGNOSIS — Z3400 Encounter for supervision of normal first pregnancy, unspecified trimester: Secondary | ICD-10-CM

## 2013-10-17 LAB — CBC
HCT: 37.4 % (ref 36.0–46.0)
Hemoglobin: 12.5 g/dL (ref 12.0–15.0)
MCH: 30.3 pg (ref 26.0–34.0)
MCHC: 33.4 g/dL (ref 30.0–36.0)
MCV: 90.6 fL (ref 78.0–100.0)
Platelets: 121 10*3/uL — ABNORMAL LOW (ref 150–400)
RBC: 4.13 MIL/uL (ref 3.87–5.11)
RDW: 13.3 % (ref 11.5–15.5)
WBC: 8.8 10*3/uL (ref 4.0–10.5)

## 2013-10-17 LAB — POCT URINALYSIS DIPSTICK
Bilirubin, UA: NEGATIVE
GLUCOSE UA: NEGATIVE
Ketones, UA: NEGATIVE
Leukocytes, UA: NEGATIVE
NITRITE UA: NEGATIVE
PROTEIN UA: NEGATIVE
RBC UA: NEGATIVE
SPEC GRAV UA: 1.01
Urobilinogen, UA: NEGATIVE
pH, UA: 6

## 2013-10-17 NOTE — Progress Notes (Signed)
Pulse 90 Pt is doing well. 

## 2013-10-17 NOTE — Progress Notes (Signed)
Subjective: Beecher McardleMichelle A Hollomon is a 28 y.o. at 34 weeks by LMP  Patient denies vaginal leaking of fluid or bleeding, denies contractions.  Reports positive fetal movment.  Denies concerns today. Denies HA, RUQ pain or vision change.  Objective: Filed Vitals:   10/17/13 1349  BP: 129/82  Temp: 98.1 F (36.7 C)   135 FHR 34 Fundal Height Fetal Position cephalic FHR 135, +accel, negative decel, moderate variability TOCO Negative  Assessment: Patient Active Problem List   Diagnosis Date Noted  . Small for gestational age 86/23/2015  . High-risk pregnancy 08/29/2013  . Cervix, short (affecting pregnancy) 08/03/2013  . Thrombocytopenia 05/26/2013  . High risk pregnancy due to history of previous obstetrical problem 05/12/2013  . PAC (premature atrial contraction) 05/03/2013  . Previous preterm delivery in second trimester, antepartum 04/10/2013  . Unspecified high-risk pregnancy 04/10/2013  . Chronic constipation 01/27/2011  . Anemia 01/27/2011  Categroy 1 FHR  Plan: Patient to return to clinic in 1 weeks, NST and ROB GBS w/ Sensitivity NV CBC today (platelet count) [facility] US and Consults scheduled Patient will stop progesterone next week Reviewed warning signs in pregnancy. Patient to call with concerns PRN. Reviewed triage location.  20 min spent with patient greater than 80% spent in counseling and coordination of care.   Alfard Cochrane Wilson SingerWren CNM

## 2013-10-24 ENCOUNTER — Ambulatory Visit: Payer: Non-veteran care

## 2013-10-24 ENCOUNTER — Encounter: Payer: Self-pay | Admitting: Advanced Practice Midwife

## 2013-10-24 ENCOUNTER — Ambulatory Visit (INDEPENDENT_AMBULATORY_CARE_PROVIDER_SITE_OTHER): Payer: PRIVATE HEALTH INSURANCE | Admitting: Advanced Practice Midwife

## 2013-10-24 ENCOUNTER — Encounter: Payer: Self-pay | Admitting: Obstetrics & Gynecology

## 2013-10-24 VITALS — BP 134/77 | Temp 98.5°F | Wt 225.0 lb

## 2013-10-24 DIAGNOSIS — IMO0002 Reserved for concepts with insufficient information to code with codable children: Secondary | ICD-10-CM

## 2013-10-24 DIAGNOSIS — O365990 Maternal care for other known or suspected poor fetal growth, unspecified trimester, not applicable or unspecified: Secondary | ICD-10-CM

## 2013-10-24 DIAGNOSIS — Z3480 Encounter for supervision of other normal pregnancy, unspecified trimester: Secondary | ICD-10-CM

## 2013-10-24 DIAGNOSIS — Z3400 Encounter for supervision of normal first pregnancy, unspecified trimester: Secondary | ICD-10-CM

## 2013-10-24 LAB — US OB DETAIL + 14 WK

## 2013-10-24 NOTE — Progress Notes (Signed)
Subjective: Cheryl [name] is a 28 y.o. at 35 weeks by LMP  Encouraged breastfeeding class.   Patient denies vaginal leaking of fluid or bleeding, denies contractions.  Reports positive fetal movment.  Denies concerns today. Denies HA, vision change or RUQ pain.  Objective: Filed Vitals:   10/24/13 1314  BP: 134/77  Temp: 98.5 F (36.9 C)   135 FHR 35 Fundal Height Fetal Position cephalic VE 1.5/80/-3  NST FHR 135, +accel, neg decel, mod variability Toco negative  Assessment: Patient Active Problem List   Diagnosis Date Noted  . Small for gestational age 34/23/2015  . High-risk pregnancy 08/29/2013  . Cervix, short (affecting pregnancy) 08/03/2013  . Gestational thrombocytopenia 05/26/2013  . High risk pregnancy due to history of previous obstetrical problem 05/12/2013  . PAC (premature atrial contraction) 05/03/2013  . Previous preterm delivery in second trimester, antepartum 04/10/2013  . Unspecified high-risk pregnancy 04/10/2013  . Chronic constipation 01/27/2011  . Anemia 01/27/2011    Plan: Patient to return to clinic on Friday. Patient reports changing NST to biweekly. Consult and US today, results pending.  Most recent platelet count 121.  Patient aware of plan of care.  Reviewed warning signs in pregnancy. Patient to call with concerns PRN. Reviewed triage location.  20 min spent with patient greater than 80% spent in counseling and coordination of care.   Jaelle Campanile Wilson SingerWren CNM

## 2013-10-24 NOTE — Progress Notes (Signed)
Pulse: 79

## 2013-10-25 NOTE — Progress Notes (Unsigned)
Patient had an ultrasound on 10-24-13 which showed the baby at the 5th percentile. Baby is growth restricted. AFI and dopplers are good Patient ot go to bi-weekly NSTs. Deliver at 39 weeks Fetal Movements Counts discussed. Follow up in 2 weeks.

## 2013-10-26 ENCOUNTER — Encounter: Payer: Self-pay | Admitting: Obstetrics & Gynecology

## 2013-10-26 LAB — OB RESULTS CONSOLE GBS: GBS: POSITIVE

## 2013-10-26 LAB — CULTURE, BETA STREP (GROUP B ONLY)

## 2013-10-27 ENCOUNTER — Ambulatory Visit (INDEPENDENT_AMBULATORY_CARE_PROVIDER_SITE_OTHER): Payer: PRIVATE HEALTH INSURANCE | Admitting: Obstetrics & Gynecology

## 2013-10-27 ENCOUNTER — Encounter: Payer: Self-pay | Admitting: Advanced Practice Midwife

## 2013-10-27 VITALS — BP 126/81 | Temp 98.0°F | Wt 228.0 lb

## 2013-10-27 DIAGNOSIS — O09899 Supervision of other high risk pregnancies, unspecified trimester: Secondary | ICD-10-CM

## 2013-10-27 DIAGNOSIS — Z22330 Carrier of Group B streptococcus: Secondary | ICD-10-CM

## 2013-10-27 DIAGNOSIS — Z3400 Encounter for supervision of normal first pregnancy, unspecified trimester: Secondary | ICD-10-CM

## 2013-10-27 DIAGNOSIS — O139 Gestational [pregnancy-induced] hypertension without significant proteinuria, unspecified trimester: Secondary | ICD-10-CM

## 2013-10-27 DIAGNOSIS — O99820 Streptococcus B carrier state complicating pregnancy: Secondary | ICD-10-CM

## 2013-10-27 LAB — POCT URINALYSIS DIPSTICK
Bilirubin, UA: NEGATIVE
Blood, UA: NEGATIVE
Glucose, UA: NEGATIVE
Ketones, UA: NEGATIVE
NITRITE UA: NEGATIVE
PH UA: 7
PROTEIN UA: NEGATIVE
Spec Grav, UA: <=1.005
Urobilinogen, UA: NEGATIVE

## 2013-10-27 NOTE — Patient Instructions (Signed)
Sudden Infant Death Syndrome (SIDS): Sleeping Position SIDS is the sudden death of a healthy infant. The cause of SIDS is not known. However, there are certain factors that put the baby at risk, such as:  Babies placed on their stomach or side to sleep.  The baby being born earlier than normal (prematurity).  Being of [place] American, Native [place], and [place] Native descent.  Being a female. SIDS is seen more often in female babies than in female babies.  Sleeping on a soft surface.  Overheating.  Having a mother who smokes or uses illegal drugs.  Being an infant of a mother who is very young.  Having poor prenatal care.  Babies that had a low weight at birth.  Abnormalities of the placenta, the organ that provides nutrition in the womb.  Babies born in the fall or winter months.  Recent respiratory tract infection. Although it is recommended that most babies should be put on their backs to sleep, some questions have arisen: IS THE SIDE POSITION AS EFFECTIVE AS THE BACK? The side position is not recommended because there is still an increased chance of SIDS compared to the back position. Your baby should be placed on his or her back every time he or she sleeps. ARE THERE ANY BABIES WHO SHOULD BE PLACED ON THEIR TUMMY FOR SLEEP? Babies with certain disorders have fewer problems when lying on their tummy. These babies include:  Infants with symptomatic gastro-esophageal reflux (GERD). Reflux is usually less in the tummy position.  Babies with certain upper airway malformations, such as Robin syndrome. There are fewer occurrences of the airway being blocked when lying on the stomach. Before letting your baby sleep on his/her tummy, discuss with your caregiver. If your baby has one of the above problems, your caregiver will help you decide if the benefits of tummy sleeping are more than the small increased risk for SIDS. Be sure to avoid overheating and soft bedding as these risk  factors are troublesome for belly sleeping infants. SHOULD HEALTHY BABIES EVER BE PLACED ON THE TUMMY? Having tummy time while the baby is awake is important for movement (motor) development. It can also lower the chance of a flattened head (positional plagiocephaly). Flattened head can be the result of spending too much time on their back. Tummy time when the baby is awake and watched by an adult is good for baby's development. WHICH SLEEPING POSITION IS BEST FOR A BABY BORN EARLY (PRE-TERM) AFTER LEAVING THE HOSPITAL? In the nursery, babies who are born early (pre-term) often receive care in a position lying on their backs. Once recovered and ready to leave the hospital, there is no reason to believe that they should be treated any differently than a baby who was born at term. Unless there are specific instructions to do otherwise, these babies should be placed on their backs to sleep. IN WHAT POSITION ARE FULL-TERM BABIES PUT TO SLEEP IN HOSPITAL NURSERIES? Unless there is a specific reason to do otherwise, babies are placed on their backs in hospital nurseries.  IF A BABY DOES NOT SLEEP WELL ON HIS OR HER BACK, IS IT OKAY TO TURN HIM OR HER TO A SIDE OR TUMMY POSITION? No. Because of the risk of SIDS, the side and tummy positions are not recommended. Positional preference appears to be a learned behavior among infants from birth to 634 to 316 months of age. Infants who are always placed on their backs will become used to this position. If your baby  is not sleeping well, look for possible reasons. For example, be sure to avoid overheating or the use of soft bedding. AT WHAT AGE CAN YOU STOP USING THE BACK POSITION FOR SLEEP? The peak risk for SIDS is age 30 to 8224 weeks. Although less common, it can occur up to 30 year of age. It is recommended that you place your baby on his/her back up to age 17 year.  DO I NEED TO KEEP CHECKING ON MY BABY AFTER LAYING HIM OR HER DOWN FOR SLEEP IN A BACK-LYING POSITION?    No. Very young infants placed on their backs cannot roll onto their tummies. HOW SHOULD HOSPITALS PLACE BABIES DOWN FOR SLEEP IF THEY ARE READMITTED? As a general guideline, hospitalized infants should sleep on their backs just as they would at home. However, there may be a medical problem that would require a side or tummy position.  WILL BABIES ASPIRATE ON THEIR BACKS? There is no evidence that healthy babies are more likely to inhale stomach contents (have aspiration episodes) when they are on their backs. In the majority of the small number of reported cases of death due to aspiration, the infant's position at death, when known, was on their tummy. DOES SLEEPING ON THE BACK CAUSE BABIES TO HAVE FLAT HEADS? There is some suggestion that the incidence of babies developing a flat spot on their heads may have increased since the incidence of sleeping on their tummies has decreased. Usually, this is not a serious condition. This condition will disappear within several months after the baby begins to sit up. Flat spots can be avoided by altering the head position when the baby is sleeping on his/her back. Giving your baby tummy time also helps prevent the development of a flat head. SHOULD PRODUCTS BE USED TO KEEP BABIES ON THEIR BACKS OR SIDES DURING SLEEP? Although various devices have been sold to maintain babies in a back-lying position during sleep, their use is not recommended. Infants who sleep on their backs need no extra support. SHOULD SOFT SURFACES BE AVOIDED? Several studies indicate that soft sleeping surfaces increase the risk of SIDS in infants. It is unknown how soft a surface must be to pose a threat. A firm infant-mattress with no more than a thin covering such as a sheet or rubberized pad between the infant and mattress is advised. Soft, plush, or bulky items, such as pillows, rolls of bedding, or cushions in the baby's sleeping environment are strongly warned against. These items can  come into close contact with the infant's face and might cause breathing problems.  DOES BED SHARING OR CO-SLEEPING DECREEASE RISK? No.Bed sharing, while controversial, is associated with an increased risk of SIDS, especially when the mother smokes, when sleeping occurs on a couch or sofa, when there are multiple bed sharers, or when bed sharers have consumed alcohol. Sleeping in an approved crib or bassinet in the same room as the mother decreases risk of SIDS. CAN A PACIFIER DECREASE RISK? While it is not known exactly how, pacifier use during the first year of life decreases the risk of SIDS. Give your baby the pacifier when putting the baby down, but do not force a pacifier or place one in your baby's mouth once your baby has fallen asleep. Pacifiers should not have any sugary solutions applied to them and need to be cleaned regularly. Finally, if your baby is breastfeeding, it is beneficial to delay use of a pacifier in order to firmly establish breastfeeding. Document Released: 08/25/2001  Document Revised: 11/23/2011 Document Reviewed: 04/01/2009 [facility] Patient Information 2014 [place], [place].

## 2013-10-27 NOTE — Progress Notes (Signed)
Pulse: 81 Patient states she would like to know if she could have a note of some sort for her husband because he is in the [employer], saying that she is being induced at 39 weeks. Patient states she would like to know if she can choose the induction date or if we are going to tell her a date.

## 2013-10-30 ENCOUNTER — Ambulatory Visit (INDEPENDENT_AMBULATORY_CARE_PROVIDER_SITE_OTHER): Payer: PRIVATE HEALTH INSURANCE | Admitting: Obstetrics

## 2013-10-30 ENCOUNTER — Encounter: Payer: Self-pay | Admitting: Obstetrics & Gynecology

## 2013-10-30 VITALS — BP 109/77 | Temp 98.0°F | Wt 229.0 lb

## 2013-10-30 DIAGNOSIS — Z3400 Encounter for supervision of normal first pregnancy, unspecified trimester: Secondary | ICD-10-CM

## 2013-10-30 LAB — POCT URINALYSIS DIPSTICK
Bilirubin, UA: NEGATIVE
Glucose, UA: NEGATIVE
Ketones, UA: NEGATIVE
NITRITE UA: NEGATIVE
PH UA: 7
Protein, UA: NEGATIVE
RBC UA: NEGATIVE
Spec Grav, UA: 1.01
UROBILINOGEN UA: NEGATIVE

## 2013-10-30 NOTE — Progress Notes (Signed)
Pulse: 96 Patient would like to know when she is going to be induced.

## 2013-10-31 ENCOUNTER — Encounter: Admitting: Advanced Practice Midwife

## 2013-11-01 ENCOUNTER — Encounter: Payer: Self-pay | Admitting: Obstetrics & Gynecology

## 2013-11-02 ENCOUNTER — Encounter: Payer: Self-pay | Admitting: Advanced Practice Midwife

## 2013-11-02 ENCOUNTER — Encounter: Payer: Self-pay | Admitting: Obstetrics

## 2013-11-03 ENCOUNTER — Ambulatory Visit (INDEPENDENT_AMBULATORY_CARE_PROVIDER_SITE_OTHER): Payer: PRIVATE HEALTH INSURANCE | Admitting: Advanced Practice Midwife

## 2013-11-03 ENCOUNTER — Encounter: Payer: Self-pay | Admitting: Advanced Practice Midwife

## 2013-11-03 VITALS — BP 130/85 | Temp 98.8°F | Wt 231.0 lb

## 2013-11-03 DIAGNOSIS — Z3400 Encounter for supervision of normal first pregnancy, unspecified trimester: Secondary | ICD-10-CM

## 2013-11-03 DIAGNOSIS — O139 Gestational [pregnancy-induced] hypertension without significant proteinuria, unspecified trimester: Secondary | ICD-10-CM

## 2013-11-03 DIAGNOSIS — R350 Frequency of micturition: Secondary | ICD-10-CM

## 2013-11-03 LAB — POCT URINALYSIS DIPSTICK
Bilirubin, UA: NEGATIVE
Glucose, UA: NEGATIVE
Ketones, UA: NEGATIVE
Leukocytes, UA: NEGATIVE
NITRITE UA: NEGATIVE
PROTEIN UA: NEGATIVE
RBC UA: NEGATIVE
UROBILINOGEN UA: NEGATIVE
pH, UA: 6

## 2013-11-03 NOTE — Progress Notes (Signed)
Subjective: Cheryl [name] is a 28 y.o. at 37 weeks by LMP  Patient denies vaginal leaking of fluid or bleeding, denies contractions.  Reports positive fetal movment.  Patient has stopped progesterone. No concerns. Reports an odor with her urine that is resolving. Denies pain or abnormal discharge. I had previously discussed classes and preparing for the baby.   Objective: Filed Vitals:   11/03/13 1339  BP: 130/85  Temp: 98.8 F (37.1 C)   140 FHR 37 Fundal Height Fetal Position cephalic  Assessment: Patient Active Problem List   Diagnosis Date Noted  . GBS (group B Streptococcus carrier), +RV culture, currently pregnant 10/27/2013  . GBS (group B Streptococcus carrier), +RV culture, currently pregnant 10/27/2013  . Fetal growth restriction 10/26/2013  . Small for gestational age 24/23/2015  . High-risk pregnancy 08/29/2013  . Cervix, short (affecting pregnancy) 08/03/2013  . Gestational thrombocytopenia 05/26/2013  . Maternal obesity syndrome 05/17/2013  . High risk pregnancy due to history of previous obstetrical problem 05/12/2013  . PAC (premature atrial contraction) 05/03/2013  . Previous preterm delivery in second trimester, antepartum 04/10/2013  . Unspecified high-risk pregnancy 04/10/2013  . Chronic constipation 01/27/2011  . Anemia 01/27/2011  Reactive NST  Plan: Patient to return to clinic in 37 weeks +GBS, had requested sensitivities and it appears the lab did not run them. Will inquire on Monday. Schedule IOL @ 39 weeks.  Reviewed warning signs in pregnancy. Patient to call with concerns PRN. Reviewed triage location. MFM appt next week. Cont w/ plan of care. Urine culture pending.  20 min spent with patient greater than 80% spent in counseling and coordination of care.   Rea Kalama Wilson SingerWren CNM

## 2013-11-03 NOTE — Progress Notes (Signed)
Pulse: 85  Patient denies any concerns.

## 2013-11-04 LAB — CULTURE, OB URINE
Colony Count: NO GROWTH
Organism ID, Bacteria: NO GROWTH

## 2013-11-07 ENCOUNTER — Inpatient Hospital Stay (HOSPITAL_COMMUNITY)
Admission: AD | Admit: 2013-11-07 | Discharge: 2013-11-07 | Disposition: A | Discharge status: Home / Self Care | Source: Ambulatory Visit | Attending: Obstetrics | Admitting: Obstetrics

## 2013-11-07 ENCOUNTER — Encounter (HOSPITAL_COMMUNITY): Payer: Self-pay | Admitting: *Deleted

## 2013-11-07 ENCOUNTER — Institutional Professional Consult (permissible substitution)

## 2013-11-07 ENCOUNTER — Encounter: Payer: Self-pay | Admitting: Advanced Practice Midwife

## 2013-11-07 ENCOUNTER — Encounter: Payer: PRIVATE HEALTH INSURANCE | Admitting: Advanced Practice Midwife

## 2013-11-07 DIAGNOSIS — O99119 Other diseases of the blood and blood-forming organs and certain disorders involving the immune mechanism complicating pregnancy, unspecified trimester: Secondary | ICD-10-CM

## 2013-11-07 DIAGNOSIS — O368190 Decreased fetal movements, unspecified trimester, not applicable or unspecified: Secondary | ICD-10-CM | POA: Insufficient documentation

## 2013-11-07 DIAGNOSIS — D696 Thrombocytopenia, unspecified: Secondary | ICD-10-CM

## 2013-11-07 DIAGNOSIS — O99820 Streptococcus B carrier state complicating pregnancy: Secondary | ICD-10-CM

## 2013-11-07 NOTE — MAU Note (Signed)
Baby has been moving, just not as much as usual. Denies pain, bleeding or leaking.  Last preg  Had stillbirth, so is really worried.

## 2013-11-07 NOTE — MAU Provider Note (Signed)
History     CSN: [id number]  Arrival date and time: 11/07/13 1340   First Provider Initiated Contact with Patient 11/07/13 1417      Chief Complaint  Patient presents with  . Decreased Fetal Movement   HPI  Pt is a 28 yo G4P0120 at 7227w4d wks IUP here with report of decreased fetal movement since last night.  Reports feeling some movement, but it was not as strong as usual.  Since arrival at MAU movement has increased.  No report of vaginal bleeding or leaking of fluid.  Reports no issues during this current pregnancy.  Past Medical History  Diagnosis Date  . Anemia   . HELLP syndrome   . Preterm labor   . Pregnancy induced hypertension     Past Surgical History  Procedure Laterality Date  . None    . No past surgeries      Family History  Problem Relation Age of Onset  . Cancer Maternal Grandmother     Bladder  . Diabetes Maternal Grandmother   . Diabetes Maternal Grandfather   . Diabetes Mother     History  Substance Use Topics  . Smoking status: Never Smoker   . Smokeless tobacco: Never Used  . Alcohol Use: No     Comment: Rarely    Allergies:  Allergies  Allergen Reactions  . Amoxicillin Rash and Swelling  . Flagyl [Metronidazole] Swelling and Rash  . Latex Rash  . Penicillins Swelling and Rash    Facility-administered medications prior to admission  Medication Dose Route Frequency Provider Last Rate Last Dose  . [DISCONTINUED] Tdap (BOOSTRIX) injection 0.5 mL  0.5 mL Intramuscular Once Amy Dessa PhiHowell Wren, CNM       Prescriptions prior to admission  Medication Sig Dispense Refill  . docusate sodium (COLACE) 100 MG capsule Take 100 mg by mouth daily.       . Iron 15 MG/1.5ML SUSP Take 20 mLs by mouth daily.       . Prenatal Vit-Fe Fumarate-FA (MULTIVITAMIN-PRENATAL) 27-0.8 MG TABS tablet Take by mouth.      Marland Kitchen. aspirin EC 81 MG tablet Take 81 mg by mouth.        Review of Systems  Constitutional:       Decreased fetal movement  All other systems  reviewed and are negative.   Physical Exam   Last menstrual period 02/17/2013.  Physical Exam  Constitutional: She is oriented to person, place, and time. She appears well-developed and well-nourished. No distress.  HENT:  Head: Normocephalic.  Neck: Normal range of motion. Neck supple.  Cardiovascular: Normal rate, regular rhythm and normal heart sounds.   Respiratory: Effort normal and breath sounds normal. No respiratory distress.  GI: Soft. She exhibits no mass. There is no tenderness. There is no CVA tenderness.  Genitourinary: Uterus is enlarged. Cervix exhibits no motion tenderness and no discharge. No bleeding around the vagina.  Musculoskeletal: Normal range of motion.  Neurological: She is alert and oriented to person, place, and time.  Skin: Skin is warm and dry.  Psychiatric: She has a normal mood and affect.   FHR 130's, +accels, reactive Toco - none MAU Course  Procedures  1435 Consulted with Dr. Clearance CootsHarper > reviewed HP/exam/OB hx/appt scheduled for tomorrow > discharge to home with follow-up in office tomorrow. Assessment and Plan  28 yo G4P0120 at 4327w4d wks IUP Category I FHR Tracing  Plan: Discharge home Reviewed kick counts Keep schedule appt for tomorrow  [facility],Dereck Agerton 11/07/2013, 2:18 PM

## 2013-11-07 NOTE — Progress Notes (Signed)
WRitten and verbal d/c instructions given and understanding voiced.  

## 2013-11-07 NOTE — Progress Notes (Signed)
IOL scheduled at 39 weeks. 11-17-2013 @ 730 PM. Review w/ patient NV.

## 2013-11-08 ENCOUNTER — Other Ambulatory Visit: Payer: Self-pay | Admitting: *Deleted

## 2013-11-08 ENCOUNTER — Ambulatory Visit (HOSPITAL_COMMUNITY)
Admission: RE | Admit: 2013-11-08 | Discharge: 2013-11-08 | Disposition: A | Discharge status: Home / Self Care | Source: Ambulatory Visit | Attending: Obstetrics & Gynecology | Admitting: Obstetrics & Gynecology

## 2013-11-08 ENCOUNTER — Telehealth (HOSPITAL_COMMUNITY): Payer: Self-pay | Admitting: *Deleted

## 2013-11-08 ENCOUNTER — Ambulatory Visit (INDEPENDENT_AMBULATORY_CARE_PROVIDER_SITE_OTHER): Payer: PRIVATE HEALTH INSURANCE | Admitting: Obstetrics & Gynecology

## 2013-11-08 VITALS — BP 127/83 | Temp 98.5°F | Wt 230.0 lb

## 2013-11-08 DIAGNOSIS — O288 Other abnormal findings on antenatal screening of mother: Secondary | ICD-10-CM

## 2013-11-08 DIAGNOSIS — O139 Gestational [pregnancy-induced] hypertension without significant proteinuria, unspecified trimester: Secondary | ICD-10-CM

## 2013-11-08 DIAGNOSIS — Z3400 Encounter for supervision of normal first pregnancy, unspecified trimester: Secondary | ICD-10-CM

## 2013-11-08 DIAGNOSIS — O289 Unspecified abnormal findings on antenatal screening of mother: Secondary | ICD-10-CM | POA: Insufficient documentation

## 2013-11-08 DIAGNOSIS — O368190 Decreased fetal movements, unspecified trimester, not applicable or unspecified: Secondary | ICD-10-CM | POA: Insufficient documentation

## 2013-11-08 LAB — POCT URINALYSIS DIPSTICK
Bilirubin, UA: NEGATIVE
Blood, UA: NEGATIVE
GLUCOSE UA: NEGATIVE
KETONES UA: NEGATIVE
Leukocytes, UA: NEGATIVE
Nitrite, UA: NEGATIVE
Protein, UA: NEGATIVE
SPEC GRAV UA: 1.01
Urobilinogen, UA: NEGATIVE
pH, UA: 6

## 2013-11-08 NOTE — Telephone Encounter (Signed)
Preadmission screen  

## 2013-11-08 NOTE — Progress Notes (Signed)
Pulse: 93 Patient states that during her last MFM she was asked if she should still be taking the Asprin leading up to her induction next Friday. Patient states she currently stopped taking it just incase. Patient states she received a call from Amy yesterday and was told to confirm the time of induction with Dr. Tamela OddiJackson-Moore.  IOL next Friday.  Non-reactive NST.  BPP today.

## 2013-11-09 ENCOUNTER — Encounter (HOSPITAL_COMMUNITY): Payer: Self-pay | Admitting: *Deleted

## 2013-11-09 ENCOUNTER — Telehealth (HOSPITAL_COMMUNITY): Payer: Self-pay | Admitting: *Deleted

## 2013-11-09 NOTE — Telephone Encounter (Signed)
Preadmission screen  

## 2013-11-09 NOTE — Patient Instructions (Signed)
Fetal Biophysical Profile °This is a test that measures five different variables of the fetus: Heart rate, breathing movement, total movement of the baby, fetal muscle tone, the amount of amniotic fluid, and the heart rate activity of the fetus. The five variables are measured individually and contribute either a 2 or a 0 to the overall scoring of the test. The measurements are as follows: °· Fetal heart rate activity. This is measured and scored in the same way as a non-stress test. The fetal heart rate is considered reactive when there are movement-associated fetal heart rate increases of at least 15 beats per minute above baseline, and 15 seconds in duration over a 20-minute period. A score of 2 is given for reactivity, and a score of 0 indicates that the fetal heart rate is non-reactive. °· Fetal breathing movements. This is scored based on fetal breathing movements and indicate fetal well-being. Their absence may indicate a low oxygen level for the fetus. Fetal breathing increases in frequency and uniformity after the 36th week of pregnancy. To earn a score of 2, the fetus must have at least one episode of fetal breathing lasting at least 60 seconds within a 30-minute observation. Absence of this breathing is scored a 0 on the BPP. °· Fetal body movements. Fetal activity is a reflection of brain integrity and function. The presence of at least three episodes of fetal movements within a 30-minute period is given a score of 2. A score of 0 is given with two or less movements in this time period. Fetal activity is highest 1 to 3 hours after the mother has eaten a meal. °· Fetal tone. In the uterus, the fetus is normally in a position of flexion. This means the head is bent down towards the knees. The fetus also stretches, rolls, and moves in the uterus. The arms, legs, trunk, and head may be flexed and extended. A score of 2 is earned when there is at least one episode of active extension with return flexion. A  score of 0 is given for slow extension with a return to only partial flexion. Fetal movement not followed by return to flexion, limbs or spine in extension, and an open fetal hand score 0. °· Amniotic fluid volume. Amniotic fluid volume has been demonstrated to be a good method of predicting fetal distress. Too little amniotic fluid has been associated with fetal abnormalities, slow uterine growth, and over due pregnancy. A score of 2 is given for this when there is at least one pocket of amniotic fluid that measures 1 cm in a specific area. A score of 0 indicates either that fluid is absent in most areas of the uterine cavity or that the largest pocket of fluid measures less than 1 cm. °PREPARATION FOR TEST °No preparation or fasting is necessary. °NORMAL FINDINGS °· A score of 8-10 points (if amniotic fluid volume is adequate). °· Possible critical values: Less than 4 may necessitate immediate delivery of fetus. °Ranges for normal findings may vary among different laboratories and hospitals. You should always check with your doctor after having lab work or other tests done to discuss the meaning of your test results and whether your values are considered within normal limits. °MEANING OF TEST  °Your caregiver will go over the test results with you and discuss the importance and meaning of your results, as well as treatment options and the need for additional tests if necessary. °OBTAINING THE TEST RESULTS  °It is your responsibility to obtain your test   results. Ask the lab or department performing the test when and how you will get your results. °Document Released: 01/01/2005 Document Revised: 11/23/2011 Document Reviewed: 08/10/2008 °ExitCare® Patient Information ©2014 [employer]. ° °

## 2013-11-10 ENCOUNTER — Encounter: Payer: Self-pay | Admitting: Obstetrics & Gynecology

## 2013-11-10 ENCOUNTER — Ambulatory Visit (INDEPENDENT_AMBULATORY_CARE_PROVIDER_SITE_OTHER): Payer: PRIVATE HEALTH INSURANCE | Admitting: Obstetrics & Gynecology

## 2013-11-10 VITALS — BP 132/83 | Temp 98.5°F | Wt 231.0 lb

## 2013-11-10 DIAGNOSIS — Z3400 Encounter for supervision of normal first pregnancy, unspecified trimester: Secondary | ICD-10-CM

## 2013-11-10 LAB — POCT URINALYSIS DIPSTICK
BILIRUBIN UA: NEGATIVE
Blood, UA: NEGATIVE
Glucose, UA: NEGATIVE
KETONES UA: NEGATIVE
Nitrite, UA: NEGATIVE
Spec Grav, UA: 1.015
Urobilinogen, UA: NEGATIVE
pH, UA: 6

## 2013-11-10 NOTE — Progress Notes (Signed)
Pulse- 85 Doing well. 

## 2013-11-14 ENCOUNTER — Encounter: Payer: Self-pay | Admitting: Advanced Practice Midwife

## 2013-11-14 ENCOUNTER — Ambulatory Visit (INDEPENDENT_AMBULATORY_CARE_PROVIDER_SITE_OTHER): Payer: PRIVATE HEALTH INSURANCE | Admitting: Advanced Practice Midwife

## 2013-11-14 VITALS — BP 119/73 | Temp 98.4°F | Wt 234.0 lb

## 2013-11-14 DIAGNOSIS — O139 Gestational [pregnancy-induced] hypertension without significant proteinuria, unspecified trimester: Secondary | ICD-10-CM

## 2013-11-14 DIAGNOSIS — Z3400 Encounter for supervision of normal first pregnancy, unspecified trimester: Secondary | ICD-10-CM

## 2013-11-14 LAB — POCT URINALYSIS DIPSTICK
Bilirubin, UA: NEGATIVE
Blood, UA: NEGATIVE
Glucose, UA: NEGATIVE
Ketones, UA: NEGATIVE
Leukocytes, UA: NEGATIVE
Nitrite, UA: NEGATIVE
PROTEIN UA: NEGATIVE
SPEC GRAV UA: 1.015
UROBILINOGEN UA: NEGATIVE
pH, UA: 6

## 2013-11-14 LAB — CULTURE, STREPTOCOCCUS GRP B W/SUSCEPT

## 2013-11-14 NOTE — Progress Notes (Signed)
Pulse: 82 Patient states she is having pelvic pain. Patient would like to know the results of the last GBS.

## 2013-11-14 NOTE — Progress Notes (Signed)
Subjective: Cheryl [name] is a 28 y.o. at 38 weeks by LMP  Patient denies vaginal leaking of fluid or bleeding, denies contractions.  Reports positive fetal movment.  Denies concerns today.  Objective: Filed Vitals:   11/14/13 1324  BP: 119/73  Temp: 98.4 F (36.9 C)   140 FHR 38 Fundal Height Fetal Position cephalic Fetal NST, FHR 140, + accel, neg decel, moderate variability Toco: negative  Assessment: Patient Active Problem List   Diagnosis Date Noted  . GBS (group B Streptococcus carrier), +RV culture, currently pregnant 10/27/2013  . GBS (group B Streptococcus carrier), +RV culture, currently pregnant 10/27/2013  . Fetal growth restriction 10/26/2013  . Small for gestational age 39/23/2015  . High-risk pregnancy 08/29/2013  . Cervix, short (affecting pregnancy) 08/03/2013  . Gestational thrombocytopenia 05/26/2013  . Maternal obesity syndrome 05/17/2013  . High risk pregnancy due to history of previous obstetrical problem 05/12/2013  . PAC (premature atrial contraction) 05/03/2013  . Previous preterm delivery in second trimester, antepartum 04/10/2013  . Unspecified high-risk pregnancy 04/10/2013  . Chronic constipation 01/27/2011  . Anemia 01/27/2011  Category 1 FHR, Normotensive  Plan: IOL scheduled this Friday. Reviewed warning signs in pregnancy. Patient to call with concerns PRN. Reviewed triage location. Patient has Pediatrician. Uncertain of BCM PP.  IOL scheduled @ 39 weeks. GBS, plan treatment w/ clindamycin @ 8 on L&D.   Tattiana Fakhouri CNM 20 min spent with patient greater than 80% spent in counseling and coordination of care.

## 2013-11-15 NOTE — Patient Instructions (Signed)
Labor Induction  Labor induction is when steps are taken to cause a pregnant woman to begin the labor process. Most women go into labor on their own between 37 weeks and 42 weeks of the pregnancy. When this does not happen or when there is a medical need, methods may be used to induce labor. Labor induction causes a pregnant woman's uterus to contract. It also causes the cervix to soften (ripen), open (dilate), and thin out (efface). Usually, labor is not induced before 39 weeks of the pregnancy unless there is a problem with the baby or mother.  Before inducing labor, your health care provider will consider a number of factors, including the following:  The medical condition of you and the baby.   How many weeks along you are.   The status of the baby's lung maturity.   The condition of the cervix.   The position of the baby.  WHAT ARE THE REASONS FOR LABOR INDUCTION? Labor may be induced for the following reasons:  The health of the baby or mother is at risk.   The pregnancy is overdue by 1 week or more.   The water breaks but labor does not start on its own.   The mother has a health condition or serious illness, such as high blood pressure, infection, placental abruption, or diabetes.  The amniotic fluid amounts are low around the baby.   The baby is distressed.  Convenience or wanting the baby to be born on a certain date is not a reason for inducing labor. WHAT METHODS ARE USED FOR LABOR INDUCTION? Several methods of labor induction may be used, such as:   Prostaglandin medicine. This medicine causes the cervix to dilate and ripen. The medicine will also start contractions. It can be taken by mouth or by inserting a suppository into the vagina.   Inserting a thin tube (catheter) with a balloon on the end into the vagina to dilate the cervix. Once inserted, the balloon is expanded with water, which causes the cervix to open.   Stripping the membranes. Your health  care provider separates amniotic sac tissue from the cervix, causing the cervix to be stretched and causing the release of a hormone called progesterone. This may cause the uterus to contract. It is often done during an office visit. You will be sent home to wait for the contractions to begin. You will then come in for an induction.   Breaking the water. Your health care provider makes a hole in the amniotic sac using a small instrument. Once the amniotic sac breaks, contractions should begin. This may still take hours to see an effect.   Medicine to trigger or strengthen contractions. This medicine is given through an IV access tube inserted into a vein in your arm.  All of the methods of induction, besides stripping the membranes, will be done in the hospital. Induction is done in the hospital so that you and the baby can be carefully monitored.  HOW LONG DOES IT TAKE FOR LABOR TO BE INDUCED? Some inductions can take up to 2 3 days. Depending on the cervix, it usually takes less time. It takes longer when you are induced early in the pregnancy or if this is your first pregnancy. If a mother is still pregnant and the induction has been going on for 2 3 days, either the mother will be sent home or a cesarean delivery will be needed. WHAT ARE THE RISKS ASSOCIATED WITH LABOR INDUCTION? Some of the risks   of induction include:   Changes in fetal heart rate, such as too high, too low, or erratic.   Fetal distress.   Chance of infection for the mother and baby.   Increased chance of having a cesarean delivery.   Breaking off (abruption) of the placenta from the uterus (rare).   Uterine rupture (very rare).  When induction is needed for medical reasons, the benefits of induction may outweigh the risks. WHAT ARE SOME REASONS FOR NOT INDUCING LABOR? Labor induction should not be done if:   It is shown that your baby does not tolerate labor.   You have had previous surgeries on your  uterus, such as a myomectomy or the removal of fibroids.   Your placenta lies very low in the uterus and blocks the opening of the cervix (placenta previa).   Your baby is not in a head-down position.   The umbilical cord drops down into the birth canal in front of the baby. This could cut off the baby's blood and oxygen supply.   You have had a previous cesarean delivery.   There are unusual circumstances, such as the baby being extremely premature.  Document Released: 01/20/2007 Document Revised: 05/03/2013 Document Reviewed: 03/30/2013 ExitCare Patient Information 2014 [employer].  

## 2013-11-17 ENCOUNTER — Encounter (HOSPITAL_COMMUNITY): Payer: Self-pay

## 2013-11-17 ENCOUNTER — Inpatient Hospital Stay (HOSPITAL_COMMUNITY)
Admission: RE | Admit: 2013-11-17 | Discharge: 2013-11-19 | DRG: 775 | Disposition: A | Discharge status: Home / Self Care | Payer: Non-veteran care | Source: Ambulatory Visit | Attending: Obstetrics & Gynecology | Admitting: Obstetrics & Gynecology

## 2013-11-17 ENCOUNTER — Other Ambulatory Visit: Payer: Self-pay | Admitting: Advanced Practice Midwife

## 2013-11-17 ENCOUNTER — Inpatient Hospital Stay (HOSPITAL_COMMUNITY)
Admission: AD | Admit: 2013-11-17 | Discharge: 2013-11-17 | Disposition: A | Discharge status: Home / Self Care | Source: Ambulatory Visit | Attending: Obstetrics & Gynecology | Admitting: Obstetrics & Gynecology

## 2013-11-17 ENCOUNTER — Encounter (HOSPITAL_COMMUNITY): Payer: Self-pay | Admitting: *Deleted

## 2013-11-17 DIAGNOSIS — Z22330 Carrier of Group B streptococcus: Secondary | ICD-10-CM

## 2013-11-17 DIAGNOSIS — O9989 Other specified diseases and conditions complicating pregnancy, childbirth and the puerperium: Secondary | ICD-10-CM

## 2013-11-17 DIAGNOSIS — D689 Coagulation defect, unspecified: Secondary | ICD-10-CM | POA: Diagnosis present

## 2013-11-17 DIAGNOSIS — Z3490 Encounter for supervision of normal pregnancy, unspecified, unspecified trimester: Secondary | ICD-10-CM

## 2013-11-17 DIAGNOSIS — O99892 Other specified diseases and conditions complicating childbirth: Secondary | ICD-10-CM | POA: Diagnosis present

## 2013-11-17 DIAGNOSIS — O365990 Maternal care for other known or suspected poor fetal growth, unspecified trimester, not applicable or unspecified: Secondary | ICD-10-CM | POA: Diagnosis present

## 2013-11-17 DIAGNOSIS — O9912 Other diseases of the blood and blood-forming organs and certain disorders involving the immune mechanism complicating childbirth: Secondary | ICD-10-CM

## 2013-11-17 DIAGNOSIS — D696 Thrombocytopenia, unspecified: Secondary | ICD-10-CM | POA: Diagnosis present

## 2013-11-17 DIAGNOSIS — O26859 Spotting complicating pregnancy, unspecified trimester: Secondary | ICD-10-CM | POA: Insufficient documentation

## 2013-11-17 LAB — CBC
HCT: 33.8 % — ABNORMAL LOW (ref 36.0–46.0)
Hemoglobin: 11.6 g/dL — ABNORMAL LOW (ref 12.0–15.0)
MCH: 30.1 pg (ref 26.0–34.0)
MCHC: 34.3 g/dL (ref 30.0–36.0)
MCV: 87.8 fL (ref 78.0–100.0)
Platelets: 74 10*3/uL — ABNORMAL LOW (ref 150–400)
RBC: 3.85 MIL/uL — AB (ref 3.87–5.11)
RDW: 13.5 % (ref 11.5–15.5)
WBC: 9.8 10*3/uL (ref 4.0–10.5)

## 2013-11-17 MED ORDER — OXYTOCIN 40 UNITS IN LACTATED RINGERS INFUSION - SIMPLE MED
62.5000 mL/h | INTRAVENOUS | Status: DC
  Administered 2013-11-18: 62.5 mL/h via INTRAVENOUS
  Filled 2013-11-17: qty 1000

## 2013-11-17 MED ORDER — LIDOCAINE HCL (PF) 1 % IJ SOLN
30.0000 mL | INTRAMUSCULAR | Status: DC | PRN
  Administered 2013-11-18: 30 mL via SUBCUTANEOUS
  Filled 2013-11-17: qty 30

## 2013-11-17 MED ORDER — CITRIC ACID-SODIUM CITRATE 334-500 MG/5ML PO SOLN: 30.0000 mL | ORAL | Status: DC | PRN

## 2013-11-17 MED ORDER — ONDANSETRON HCL 4 MG/2ML IJ SOLN: 4.0000 mg | Freq: Four times a day (QID) | INTRAMUSCULAR | Status: DC | PRN

## 2013-11-17 MED ORDER — FLEET ENEMA 7-19 GM/118ML RE ENEM: 1.0000 | ENEMA | RECTAL | Status: DC | PRN

## 2013-11-17 MED ORDER — TERBUTALINE SULFATE 1 MG/ML IJ SOLN: 0.2500 mg | Freq: Once | INTRAMUSCULAR | Status: AC | PRN

## 2013-11-17 MED ORDER — ACETAMINOPHEN 325 MG PO TABS: 650.0000 mg | ORAL_TABLET | ORAL | Status: DC | PRN

## 2013-11-17 MED ORDER — IBUPROFEN 600 MG PO TABS
600.0000 mg | ORAL_TABLET | Freq: Four times a day (QID) | ORAL | Status: DC | PRN
  Administered 2013-11-18: 600 mg via ORAL
  Filled 2013-11-17: qty 1

## 2013-11-17 MED ORDER — OXYTOCIN BOLUS FROM INFUSION
500.0000 mL | INTRAVENOUS | Status: DC
  Administered 2013-11-18: 500 mL via INTRAVENOUS

## 2013-11-17 MED ORDER — LACTATED RINGERS IV SOLN: 500.0000 mL | INTRAVENOUS | Status: DC | PRN

## 2013-11-17 MED ORDER — MISOPROSTOL 25 MCG QUARTER TABLET
25.0000 ug | ORAL_TABLET | ORAL | Status: DC | PRN
  Administered 2013-11-17: 25 ug via VAGINAL
  Filled 2013-11-17: qty 0.25
  Filled 2013-11-17: qty 1
  Filled 2013-11-17: qty 0.25

## 2013-11-17 MED ORDER — BUTORPHANOL TARTRATE 1 MG/ML IJ SOLN
2.0000 mg | INTRAMUSCULAR | Status: DC | PRN
  Administered 2013-11-18: 2 mg via INTRAVENOUS
  Filled 2013-11-17: qty 2

## 2013-11-17 MED ORDER — CLINDAMYCIN PHOSPHATE 900 MG/50ML IV SOLN
900.0000 mg | Freq: Three times a day (TID) | INTRAVENOUS | Status: DC
  Administered 2013-11-17: 900 mg via INTRAVENOUS
  Filled 2013-11-17 (×3): qty 50

## 2013-11-17 MED ORDER — OXYCODONE-ACETAMINOPHEN 5-325 MG PO TABS: 1.0000 | ORAL_TABLET | ORAL | Status: DC | PRN

## 2013-11-17 MED ORDER — LACTATED RINGERS IV SOLN
INTRAVENOUS | Status: DC
  Administered 2013-11-17: 21:00:00 via INTRAVENOUS

## 2013-11-17 NOTE — MAU Note (Signed)
For induction this evening. Noted some spotting this AM. Feeling pressure.

## 2013-11-17 NOTE — Discharge Instructions (Signed)
Keep your scheduled appointment for induction. Drink 8-10 glasses of water per day.

## 2013-11-18 ENCOUNTER — Encounter (HOSPITAL_COMMUNITY): Payer: Self-pay

## 2013-11-18 LAB — TYPE AND SCREEN
ABO/RH(D): O POS
ANTIBODY SCREEN: NEGATIVE

## 2013-11-18 LAB — CBC
HEMATOCRIT: 34.3 % — AB (ref 36.0–46.0)
HEMOGLOBIN: 11.8 g/dL — AB (ref 12.0–15.0)
MCH: 30.3 pg (ref 26.0–34.0)
MCHC: 34.4 g/dL (ref 30.0–36.0)
MCV: 87.9 fL (ref 78.0–100.0)
Platelets: 71 10*3/uL — ABNORMAL LOW (ref 150–400)
RBC: 3.9 MIL/uL (ref 3.87–5.11)
RDW: 13.1 % (ref 11.5–15.5)
WBC: 14 10*3/uL — ABNORMAL HIGH (ref 4.0–10.5)

## 2013-11-18 LAB — RPR: RPR: NONREACTIVE

## 2013-11-18 MED ORDER — LANOLIN HYDROUS EX OINT: TOPICAL_OINTMENT | CUTANEOUS | Status: DC | PRN

## 2013-11-18 MED ORDER — METHYLERGONOVINE MALEATE 0.2 MG PO TABS: 0.2000 mg | ORAL_TABLET | ORAL | Status: DC | PRN

## 2013-11-18 MED ORDER — ONDANSETRON HCL 4 MG PO TABS: 4.0000 mg | ORAL_TABLET | ORAL | Status: DC | PRN

## 2013-11-18 MED ORDER — SENNOSIDES-DOCUSATE SODIUM 8.6-50 MG PO TABS
2.0000 | ORAL_TABLET | ORAL | Status: DC
  Administered 2013-11-18: 2 via ORAL
  Filled 2013-11-18: qty 2

## 2013-11-18 MED ORDER — DIPHENHYDRAMINE HCL 25 MG PO CAPS: 25.0000 mg | ORAL_CAPSULE | Freq: Four times a day (QID) | ORAL | Status: DC | PRN

## 2013-11-18 MED ORDER — METHYLERGONOVINE MALEATE 0.2 MG/ML IJ SOLN
0.2000 mg | Freq: Once | INTRAMUSCULAR | Status: AC
  Administered 2013-11-18: 0.2 mg via INTRAMUSCULAR

## 2013-11-18 MED ORDER — FERROUS SULFATE 325 (65 FE) MG PO TABS
325.0000 mg | ORAL_TABLET | Freq: Two times a day (BID) | ORAL | Status: DC
  Administered 2013-11-18 – 2013-11-19 (×4): 325 mg via ORAL
  Filled 2013-11-18 (×4): qty 1

## 2013-11-18 MED ORDER — MAGNESIUM HYDROXIDE 400 MG/5ML PO SUSP: 30.0000 mL | ORAL | Status: DC | PRN

## 2013-11-18 MED ORDER — MEASLES, MUMPS & RUBELLA VAC ~~LOC~~ INJ
0.5000 mL | INJECTION | Freq: Once | SUBCUTANEOUS | Status: DC
Start: 2013-11-19 — End: 2013-11-19
  Filled 2013-11-18: qty 0.5

## 2013-11-18 MED ORDER — DIBUCAINE 1 % RE OINT: 1.0000 "application " | TOPICAL_OINTMENT | RECTAL | Status: DC | PRN

## 2013-11-18 MED ORDER — WITCH HAZEL-GLYCERIN EX PADS: 1.0000 "application " | MEDICATED_PAD | CUTANEOUS | Status: DC | PRN

## 2013-11-18 MED ORDER — ZOLPIDEM TARTRATE 5 MG PO TABS: 5.0000 mg | ORAL_TABLET | Freq: Every evening | ORAL | Status: DC | PRN

## 2013-11-18 MED ORDER — OXYTOCIN 10 UNIT/ML IJ SOLN
INTRAMUSCULAR | Status: AC
  Administered 2013-11-18: 10 [IU]
  Filled 2013-11-18: qty 1

## 2013-11-18 MED ORDER — TETANUS-DIPHTH-ACELL PERTUSSIS 5-2.5-18.5 LF-MCG/0.5 IM SUSP: 0.5000 mL | Freq: Once | INTRAMUSCULAR | Status: DC

## 2013-11-18 MED ORDER — BENZOCAINE-MENTHOL 20-0.5 % EX AERO
1.0000 "application " | INHALATION_SPRAY | CUTANEOUS | Status: DC | PRN
  Filled 2013-11-18: qty 56

## 2013-11-18 MED ORDER — OXYTOCIN 10 UNIT/ML IJ SOLN: 10.0000 [IU] | Freq: Once | INTRAMUSCULAR | Status: DC

## 2013-11-18 MED ORDER — OXYCODONE-ACETAMINOPHEN 5-325 MG PO TABS
1.0000 | ORAL_TABLET | ORAL | Status: DC | PRN
  Administered 2013-11-18 – 2013-11-19 (×5): 1 via ORAL
  Filled 2013-11-18 (×5): qty 1

## 2013-11-18 MED ORDER — ONDANSETRON HCL 4 MG/2ML IJ SOLN: 4.0000 mg | INTRAMUSCULAR | Status: DC | PRN

## 2013-11-18 MED ORDER — METHYLERGONOVINE MALEATE 0.2 MG/ML IJ SOLN: 0.2000 mg | INTRAMUSCULAR | Status: DC | PRN

## 2013-11-18 MED ORDER — PRENATAL MULTIVITAMIN CH
1.0000 | ORAL_TABLET | Freq: Every day | ORAL | Status: DC
  Administered 2013-11-18 – 2013-11-19 (×2): 1 via ORAL
  Filled 2013-11-18 (×2): qty 1

## 2013-11-18 NOTE — H&P (Signed)
Cheryl [name] is [name] 28 y.o. female presenting for IOL. Maternal Medical History:  Reason for admission: Nausea. The patient was diagnosed with IUGR.  Antenatal testing including U/[name] dopplers/NST have been reassuring.  Fetal activity: Perceived fetal activity is normal.    Prenatal complications: IUGR and thrombocytopenia (gestational).   Prenatal Complications - Diabetes: none.    OB History   Grav Para Term Preterm Abortions TAB SAB Ect Mult Living   4 1 0 1 2 1 1 0 0 0      Past Medical History  Diagnosis Date  . Anemia   . HELLP syndrome   . Preterm labor   . Pregnancy induced hypertension    Past Surgical History  Procedure Laterality Date  . None    . No past surgeries     Family History: family history includes Cancer in her maternal grandmother; Diabetes in her maternal grandfather, maternal grandmother, and mother. Social History:  reports that she has never smoked. She has never used smokeless tobacco. She reports that she does not drink alcohol or use illicit drugs.    Review of Systems  Constitutional: Negative for fever.  Eyes: Negative for blurred vision.  Respiratory: Negative for shortness of breath.   Gastrointestinal: Negative for nausea and vomiting.  Skin: Negative for rash.  Neurological: Negative for headaches.    Dilation: 1 Effacement (%): 50 Exam by:: Cheryl [name] Blood pressure 132/74, pulse 58, temperature 97.4 F (36.3 C), temperature source Oral, resp. rate 20, height 5\' 7"  (1.702 m), weight 235 lb (106.595 kg), last menstrual period 02/17/2013. Maternal Exam:  Abdomen: not evaluated.  Introitus: Normal vulva. Pelvis: adequate for delivery.   Cervix: Cervix evaluated by digital exam.     Fetal Exam Fetal Monitor Review: Variability: moderate (6-25 bpm).   Pattern: accelerations present and no decelerations.    Fetal State Assessment: Category I - tracings are normal.     Physical Exam  Constitutional: She appears  well-developed.  HENT:  Head: Normocephalic.  Neck: Neck supple. No thyromegaly present.  Cardiovascular: Normal rate and regular rhythm.   Respiratory: Breath sounds normal.  GI: Soft. Bowel sounds are normal.  Skin: No rash noted.    Prenatal labs: ABO, Rh: --/--/O POS (03/06 2030) Antibody: PENDING (03/06 2030) Rubella: 3.65 (07/28 1450) RPR: NON REACTIVE (03/06 2030)  HBsAg: NEGATIVE (07/28 1450)  HIV: NON REACTIVE (12/02 1112)  GBS: Positive (02/12 0000)   Assessment/Plan: Primipara @ 6044w1d.  Pregnancy complicated by IUGR, gestational thrombocytopenia, GBS positive Category I FHT Borderline Bishop score  Admit Two-stage IOL   Cheryl [name] 11/18/2013, 3:46 AM

## 2013-11-18 NOTE — Lactation Note (Signed)
This note was copied from the chart of Cheryl Breck CoonsMichelle Steffenhagen. Lactation Consultation Note  Patient Name: Cheryl [name] ZOXWR'UToday's Date: 11/18/2013 Reason for consult: Initial assessment Baby is sleepy at this visit. Attempted to latch but baby would not suckle. Few drops of colostrum dripped in baby's mouth when trying to latch. BF basics reviewed with Mom, Encouraged to keep STS when awake, BFwith feeding ques, but if she does not observe feeding ques by 3 hours from last attempt to BF, place baby STS and see if she will BF. Lactation brochure left for review. Advised of OP services and support group. Advised to call for assist as needed.   Maternal Data Formula Feeding for Exclusion: No Infant to breast within first hour of birth: No Breastfeeding delayed due to:: Other (comment) (Mom reports baby did not show interest in BF during 1st hour) Has patient been taught Hand Expression?: Yes Does the patient have breastfeeding experience prior to this delivery?: No  Feeding Feeding Type: Breast Fed Length of feed: 0 min  LATCH Score/Interventions Latch: Too sleepy or reluctant, no latch achieved, no sucking elicited. Intervention(s): Adjust position;Assist with latch;Breast massage;Breast compression  Audible Swallowing: None  Type of Nipple: Everted at rest and after stimulation  Comfort (Breast/Nipple): Soft / non-tender     Hold (Positioning): Assistance needed to correctly position infant at breast and maintain latch. Intervention(s): Breastfeeding basics reviewed;Support Pillows;Position options;Skin to skin  LATCH Score: 5  Lactation Tools Discussed/Used Tools: Pump Breast pump type: Manual   Consult Status Consult Status: Follow-up Date: 11/19/13 Follow-up type: In-patient    Alfred LevinsGranger, Cheryl [name] 11/18/2013, 8:51 PM

## 2013-11-19 NOTE — Discharge Instructions (Signed)

## 2013-11-19 NOTE — Discharge Summary (Signed)
Obstetric Discharge Summary Reason for Admission: onset of labor Prenatal Procedures: none Intrapartum Procedures: spontaneous vaginal delivery Postpartum Procedures: none Complications-Operative and Postpartum: none Hemoglobin  Date Value Ref Range Status  11/18/2013 11.8* 12.0 - 15.0 g/dL Final     HCT  Date Value Ref Range Status  11/18/2013 34.3* 36.0 - 46.0 % Final    Physical Exam:  General: alert Lochia: appropriate Uterine Fundus: firm Incision: n/a DVT Evaluation: No evidence of DVT seen on physical exam.  Discharge Diagnoses: Term Pregnancy-delivered  Discharge Information: Date: 11/19/2013 Activity: unrestricted Diet: routine Medications: Tylenol Condition: stable Discharge to: home Follow-up Information   [facility] CN[facility]. Schedule an appointment as soon as possible for a visit in 2 weeks.   Specialty:  Certified Nurse Midwife   Contact information:   [address] [place] [id number] [phone]       Newborn Data: Live born female  Birth Weight: 5 lb 6.4 oz (2450 g) APGAR: 9, 9  Home with mother.  JACKSON-MOORE,Kenai Fluegel A 11/19/2013, 12:39 PM

## 2013-11-19 NOTE — Lactation Note (Signed)
This note was copied from the chart of Girl Breck CoonsMichelle Albano. Lactation Consultation Note  Patient Name: Girl Breck CoonsMichelle Barnfield ZOXWR'UToday's Date: 11/19/2013 Reason for consult: Follow-up assessment;Infant < 6lbs Mom reports baby has been more interested in the breast today. LC noted on exam that baby has a short, anterior frenulum with some limited mobility, however Mom denies any pain with baby at the breast and with the feeding at this visit, Mom's nipple was round when baby came off the breast. LC assisted Mom with obtaining good depth with the latch in football hold. Baby demonstrated a good suckling pattern when awake but as she gets sleepy at the breast she begins to chew. Few swallows noted at this visit. Discussed the frenulum with Mom and advised when want to be sure baby is transferring breast milk. Advised Mom we will continue to monitor void/stools, weight loss and feedings. Advised Mom to keep baby actively nursing for 15-20 minutes each feeding. Mom has own DEBP and has started to post pump. Gave the baby 5 ml of EBM earlier today. Advised to pre-pump for 3-5 minutes, breast feed baby, then post pump for 15-20 minutes and give baby back any amount of EBM she receives. Be sure baby is at the breast often, at least every 3 hours. Ask for assist as needed.   Maternal Data    Feeding Feeding Type: Breast Fed Length of feed: 12 min  LATCH Score/Interventions Latch: Repeated attempts needed to sustain latch, nipple held in mouth throughout feeding, stimulation needed to elicit sucking reflex. Intervention(s): Waking techniques Intervention(s): Adjust position;Assist with latch;Breast massage;Breast compression  Audible Swallowing: A few with stimulation Intervention(s): Hand expression  Type of Nipple: Everted at rest and after stimulation  Comfort (Breast/Nipple): Soft / non-tender     Hold (Positioning): Assistance needed to correctly position infant at breast and maintain  latch. Intervention(s): Breastfeeding basics reviewed;Support Pillows;Position options;Skin to skin  LATCH Score: 7  Lactation Tools Discussed/Used Tools: Pump Breast pump type: Double-Electric Breast Pump (Mom's own DEBP)   Consult Status Consult Status: Follow-up Date: 11/20/13 Follow-up type: In-patient    Alfred LevinsGranger, Rilda Bulls Ann 11/19/2013, 5:15 PM

## 2013-11-20 ENCOUNTER — Ambulatory Visit: Payer: Self-pay

## 2013-11-20 NOTE — Lactation Note (Signed)
This note was copied from the chart of Cheryl [name]. Lactation Consultation Note Mom breastfeeding baby when I enter room, baby in football on the left feeding very well; rhythmic sucking, audible swallowing, mom comfortable.  Mom states that breastfeeding has gotten much better and that she has no concerns about feeding at this time. Offered to make o/p appt; mom states that she will be traveling in the next few days and will call to make the appt when she returns to town. Lactation office number provided.  Enc mom to call the lactation office if she has any questions or concerns, and to attend the BFSG.  Patient Name: Cheryl Breck CoonsMichelle Lalley ZOXWR'UToday's Date: 11/20/2013 Reason for consult: Follow-up assessment;Infant < 6lbs   Maternal Data    Feeding Feeding Type: Breast Fed Length of feed: 20 min  LATCH Score/Interventions Latch: Grasps breast easily, tongue down, lips flanged, rhythmical sucking.  Audible Swallowing: Spontaneous and intermittent  Type of Nipple: Everted at rest and after stimulation  Comfort (Breast/Nipple): Soft / non-tender     Hold (Positioning): No assistance needed to correctly position infant at breast.  LATCH Score: 10  Lactation Tools Discussed/Used     Consult Status Consult Status: Complete    Lenard ForthSanders, Sashay Felling Fulmer 11/20/2013, 10:53 AM

## 2013-11-21 ENCOUNTER — Encounter (HOSPITAL_COMMUNITY): Payer: Self-pay | Admitting: *Deleted

## 2013-11-21 ENCOUNTER — Inpatient Hospital Stay (HOSPITAL_COMMUNITY)
Admission: AD | Admit: 2013-11-21 | Discharge: 2013-11-21 | Disposition: A | Discharge status: Home / Self Care | Payer: Non-veteran care | Source: Ambulatory Visit | Attending: Obstetrics | Admitting: Obstetrics

## 2013-11-21 ENCOUNTER — Other Ambulatory Visit: Payer: Self-pay | Admitting: Obstetrics

## 2013-11-21 DIAGNOSIS — R002 Palpitations: Secondary | ICD-10-CM | POA: Insufficient documentation

## 2013-11-21 DIAGNOSIS — G44209 Tension-type headache, unspecified, not intractable: Secondary | ICD-10-CM | POA: Insufficient documentation

## 2013-11-21 DIAGNOSIS — O9089 Other complications of the puerperium, not elsewhere classified: Secondary | ICD-10-CM | POA: Insufficient documentation

## 2013-11-21 LAB — URINALYSIS, ROUTINE W REFLEX MICROSCOPIC
Bilirubin Urine: NEGATIVE
GLUCOSE, UA: NEGATIVE mg/dL
Ketones, ur: NEGATIVE mg/dL
Nitrite: NEGATIVE
Protein, ur: NEGATIVE mg/dL
Specific Gravity, Urine: 1.015 (ref 1.005–1.030)
Urobilinogen, UA: 0.2 mg/dL (ref 0.0–1.0)
pH: 6.5 (ref 5.0–8.0)

## 2013-11-21 LAB — URINE MICROSCOPIC-ADD ON

## 2013-11-21 MED ORDER — ACETAMINOPHEN 500 MG PO TABS
1000.0000 mg | ORAL_TABLET | Freq: Once | ORAL | Status: AC
  Administered 2013-11-21: 1000 mg via ORAL
  Filled 2013-11-21: qty 2

## 2013-11-21 NOTE — MAU Note (Signed)
SVD 11/18/13. Headache since yesterday; occipital with tense neck; didn't take meds for headache. Heart palpitation since yesterday, worse today. Denies SOB or chest pain.

## 2013-11-21 NOTE — MAU Note (Signed)
PT SAYS  SHE HAD PALPITATIONS DURING PREG- SENT TO CARDIOLOGISTS -   OFF [address]-    SAYS HAS IRREG HEART BEAT- NO MEDS,   PT DEL  VAG ON 3-7-  -    YESTERDAY WAS FIRST TIME FELT PALPITATIONS AT NIGHT  TIME WHILE BREAST FEEDING.    THEN TODAY   AT 830PM- LYING ON BED- AND FELT PALPITATIONS . WHEN STAND- SIT - SHE FEELS BACK  OF NECK THROBBING.     RIGHT NOW-  PAIN IS 4-5-    IN BACK OF NECK- FEELS TIGHT-  DOES NOT FEEL PALPITATIONS.

## 2013-11-21 NOTE — Discharge Instructions (Signed)

## 2013-11-21 NOTE — MAU Provider Note (Signed)
History     CSN: [id number]  Arrival date and time: 11/21/13 2142   First Provider Initiated Contact with Patient 11/21/13 2241      Chief Complaint  Patient presents with  . Headache  . Palpitations   HPI Cheryl [name] is a 28 y.o. [phone] who is PPD #3 who presents to MAU with complaint of palpitations. The patient had palpitations during her pregnancy and was evaluated by Cardiology. She states that what she experienced today is the same as during her pregnancy. She is supposed to follow-up with Cardiology PP and has not yet. She also complains of occipital pain worse with sitting and standing. The patient did not have an epidural. She has not taken anything for pain yet. She rates her pain at 4/10 now. She denies fever.   OB History   Grav Para Term Preterm Abortions TAB SAB Ect Mult Living   4 2 1 1 2 1 1  0 0 1      Past Medical History  Diagnosis Date  . Anemia   . HELLP syndrome   . Preterm labor   . Pregnancy induced hypertension     Past Surgical History  Procedure Laterality Date  . None    . No past surgeries      Family History  Problem Relation Age of Onset  . Cancer Maternal Grandmother     Bladder  . Diabetes Maternal Grandmother   . Diabetes Maternal Grandfather   . Diabetes Mother     History  Substance Use Topics  . Smoking status: Never Smoker   . Smokeless tobacco: Never Used  . Alcohol Use: No     Comment: Rarely    Allergies:  Allergies  Allergen Reactions  . Amoxicillin Rash and Swelling  . Flagyl [Metronidazole] Swelling and Rash  . Latex Rash  . Penicillins Swelling and Rash    Prescriptions prior to admission  Medication Sig Dispense Refill  . diphenhydramine-acetaminophen (TYLENOL PM) 25-500 MG TABS Take 2 tablets by mouth at bedtime as needed (for sleep and pain).      Marland Kitchen docusate sodium (COLACE) 100 MG capsule Take 100-200 mg by mouth daily. Depending on constipation      . Iron 15 MG/1.5ML SUSP Take 20 mLs by  mouth daily.       . Prenatal Vit-Fe Fumarate-FA (PRENATAL MULTIVITAMIN) TABS tablet Take 1 tablet by mouth daily at 12 noon.        Review of Systems  Constitutional: Negative for fever and malaise/fatigue.  Eyes: Negative for blurred vision.  Cardiovascular: Positive for palpitations. Negative for chest pain.  Gastrointestinal: Negative for nausea, vomiting, abdominal pain, diarrhea and constipation.  Neurological: Positive for headaches.   Physical Exam   Blood pressure 116/57, pulse 88, temperature 99 F (37.2 C), temperature source Oral, resp. rate 20, height 5\' 7"  (1.702 m), weight 223 lb 3.2 oz (101.243 kg), SpO2 100.00%, currently breastfeeding.  Physical Exam  Constitutional: She is oriented to person, place, and time. She appears well-developed and well-nourished. No distress.  HENT:  Head: Normocephalic and atraumatic.  Neck: No Kernig's sign noted.  Cardiovascular: Normal rate, regular rhythm and normal heart sounds.   Respiratory: Effort normal and breath sounds normal. No respiratory distress.  Neurological: She is alert and oriented to person, place, and time.  Skin: Skin is warm and dry. No erythema.  Psychiatric: She has a normal mood and affect.   Results for orders placed during the hospital encounter of 11/21/13 (from  the past 24 hour(s))  URINALYSIS, ROUTINE W REFLEX MICROSCOPIC     Status: Abnormal   Collection Time    11/21/13 10:01 PM      Result Value Ref Range   Color, Urine ORANGE (*) YELLOW   APPearance CLEAR  CLEAR   Specific Gravity, Urine 1.015  1.005 - 1.030   pH 6.5  5.0 - 8.0   Glucose, UA NEGATIVE  NEGATIVE mg/dL   Hgb urine dipstick LARGE (*) NEGATIVE   Bilirubin Urine NEGATIVE  NEGATIVE   Ketones, ur NEGATIVE  NEGATIVE mg/dL   Protein, ur NEGATIVE  NEGATIVE mg/dL   Urobilinogen, UA 0.2  0.0 - 1.0 mg/dL   Nitrite NEGATIVE  NEGATIVE   Leukocytes, UA MODERATE (*) NEGATIVE  URINE MICROSCOPIC-ADD ON     Status: Abnormal   Collection Time     11/21/13 10:01 PM      Result Value Ref Range   Squamous Epithelial / LPF FEW (*) RARE   WBC, UA 21-50  <3 WBC/hpf   RBC / HPF TOO NUMEROUS TO COUNT  <3 RBC/hpf   Bacteria, UA MANY (*) RARE    MAU Course  Procedures None  MDM Discussed with Dr. Clearance CootsHarper. Call for follow-up with cardiology, follow-up for routine PP as scheduled 1000 mg Tylenol given - patient reports improvement in headache Assessment and Plan  A: Tension headache Palpitations  P: Discharge home Recommend Tylenol PRN pain Advised patient that heat may be helpful for neck pain and headache Patient advised to call for appoitment to follow-up with Cardiology Patient advised to keep scheduled PP appointment with [facility] Patient may return to MAU as needed or if her condition were to change or worsen  Freddi StarrJulie N Ethier, PA-C  11/21/2013, 10:41 PM

## 2013-11-22 ENCOUNTER — Other Ambulatory Visit: Payer: Self-pay | Admitting: Obstetrics

## 2013-11-22 DIAGNOSIS — N390 Urinary tract infection, site not specified: Secondary | ICD-10-CM

## 2013-11-25 LAB — URINE CULTURE

## 2013-11-30 ENCOUNTER — Other Ambulatory Visit: Payer: Self-pay | Admitting: *Deleted

## 2013-11-30 DIAGNOSIS — N390 Urinary tract infection, site not specified: Secondary | ICD-10-CM

## 2013-11-30 MED ORDER — NITROFURANTOIN MONOHYD MACRO 100 MG PO CAPS: 100.0000 mg | ORAL_CAPSULE | Freq: Two times a day (BID) | ORAL | Status: DC

## 2013-12-01 ENCOUNTER — Ambulatory Visit: Payer: PRIVATE HEALTH INSURANCE | Admitting: Advanced Practice Midwife

## 2013-12-08 ENCOUNTER — Ambulatory Visit (INDEPENDENT_AMBULATORY_CARE_PROVIDER_SITE_OTHER): Payer: Non-veteran care | Admitting: Obstetrics & Gynecology

## 2013-12-08 ENCOUNTER — Encounter: Payer: Self-pay | Admitting: Obstetrics & Gynecology

## 2013-12-08 NOTE — Progress Notes (Signed)
Subjective:     Cheryl [name] is a 28 y.o. female who presents for a postpartum visit. She is 2 weeks postpartum following a spontaneous vaginal delivery. I have fully reviewed the prenatal and intrapartum course. The delivery was at 39.1 gestational weeks. Outcome: spontaneous vaginal delivery. Anesthesia: IV sedation. Postpartum course has been WNL. Baby's course has been WNL. Baby is feeding by breast. Bleeding thin lochia. Bowel function is normal. Bladder function is normal. Patient is not sexually active. Contraception method is abstinence. Postpartum depression screening: negative.  The following portions of the patient's history were reviewed and updated as appropriate: allergies, current medications, past family history, past medical history, past social history, past surgical history and problem list.  Review of Systems Pertinent items are noted in HPI.   Objective:    BP 137/84  Pulse 91  Temp(Src) 98.3 F (36.8 C)  Wt 215 lb (97.523 kg)  Breastfeeding? Yes       No exam today Assessment:     Normal postpartum exam.  Plan:    1. Contraception: plans Nexplanon  2. Follow up in: 1 month or as needed.

## 2014-01-08 ENCOUNTER — Ambulatory Visit: Payer: Non-veteran care | Admitting: Obstetrics & Gynecology

## 2014-01-10 ENCOUNTER — Ambulatory Visit (INDEPENDENT_AMBULATORY_CARE_PROVIDER_SITE_OTHER): Payer: Non-veteran care | Admitting: Obstetrics & Gynecology

## 2014-01-10 ENCOUNTER — Encounter: Payer: Self-pay | Admitting: Obstetrics & Gynecology

## 2014-01-10 NOTE — Progress Notes (Signed)
Patient ID: Cheryl [name], female   DOB: 03/04/1986, 28 y.o.   MRN: [id number] Subjective:     Cheryl McardleMichelle A Ring is a 28 y.o. female who presents for a postpartum visit. She is 7 weeks postpartum following a spontaneous vaginal delivery. I have fully reviewed the prenatal and intrapartum course. The delivery was at 39gestational weeks. Outcome: spontaneous vaginal delivery. Anesthesia: none. Postpartum course has been uncomplicated. Baby's course has been uneventful. Baby is feeding by bottle. Bleeding no bleeding. Bowel function is normal. Bladder function is normal. Patient is not sexually active. Contraception method is condoms. Postpartum depression screening: negative.  The following portions of the patient's history were reviewed and updated as appropriate: allergies, current medications, past family history, past medical history, past social history, past surgical history and problem list.  Review of Systems Pertinent items are noted in HPI.   Objective:    BP 133/79  Pulse 62  Temp(Src) 98.1 F (36.7 C)  Ht 5\' 7"  (1.702 m)  Wt 98.884 kg (218 lb)  BMI 34.14 kg/m2  Breastfeeding? No        General:   alert  Skin:   no rash or abnormalities  Lungs:   clear to auscultation bilaterally  Heart:   regular rate and rhythm, S1, S2 normal, no murmur, click, rub or gallop  Breasts:   normal without suspicious masses, skin or nipple changes or axillary nodes  Abdomen:  normal findings: no organomegaly, soft, non-tender and no hernia  Pelvis:  External genitalia: normal general appearance Urinary system: urethral meatus normal and bladder without fullness, nontender Vaginal: normal without tenderness, induration or masses Cervix: normal appearance Adnexa: normal bimanual exam Uterus: anteverted and non-tender, normal size     Assessment:     Normal postpartum exam. Pap smear not done at today's visit.   Plan:    1. Contraception: condoms . Follow up as needed.

## 2014-01-10 NOTE — Patient Instructions (Signed)
Contraception Choices Contraception (birth control) is the use of any methods or devices to prevent pregnancy. Below are some methods to help avoid pregnancy. HORMONAL METHODS   Contraceptive implant This is a thin, plastic tube containing progesterone hormone. It does not contain estrogen hormone. Your health care provider inserts the tube in the inner part of the upper arm. The tube can remain in place for up to 3 years. After 3 years, the implant must be removed. The implant prevents the ovaries from releasing an egg (ovulation), thickens the cervical mucus to prevent sperm from entering the uterus, and thins the lining of the inside of the uterus.  Progesterone-only injections These injections are given every 3 months by your health care provider to prevent pregnancy. This synthetic progesterone hormone stops the ovaries from releasing eggs. It also thickens cervical mucus and changes the uterine lining. This makes it harder for sperm to survive in the uterus.  Birth control pills These pills contain estrogen and progesterone hormone. They work by preventing the ovaries from releasing eggs (ovulation). They also cause the cervical mucus to thicken, preventing the sperm from entering the uterus. Birth control pills are prescribed by a health care provider.Birth control pills can also be used to treat heavy periods.  Minipill This type of birth control pill contains only the progesterone hormone. They are taken every day of each month and must be prescribed by your health care provider.  Birth control patch The patch contains hormones similar to those in birth control pills. It must be changed once a week and is prescribed by a health care provider.  Vaginal ring The ring contains hormones similar to those in birth control pills. It is left in the vagina for 3 weeks, removed for 1 week, and then a new one is put back in place. The patient must be comfortable inserting and removing the ring from the  vagina.A health care provider's prescription is necessary.  Emergency contraception Emergency contraceptives prevent pregnancy after unprotected sexual intercourse. This pill can be taken right after sex or up to 5 days after unprotected sex. It is most effective the sooner you take the pills after having sexual intercourse. Most emergency contraceptive pills are available without a prescription. Check with your pharmacist. Do not use emergency contraception as your only form of birth control. BARRIER METHODS   Female condom This is a thin sheath (latex or rubber) that is worn over the penis during sexual intercourse. It can be used with spermicide to increase effectiveness.  Female condom. This is a soft, loose-fitting sheath that is put into the vagina before sexual intercourse.  Diaphragm This is a soft, latex, dome-shaped barrier that must be fitted by a health care provider. It is inserted into the vagina, along with a spermicidal jelly. It is inserted before intercourse. The diaphragm should be left in the vagina for 6 to 8 hours after intercourse.  Cervical cap This is a round, soft, latex or plastic cup that fits over the cervix and must be fitted by a health care provider. The cap can be left in place for up to 48 hours after intercourse.  Sponge This is a soft, circular piece of polyurethane foam. The sponge has spermicide in it. It is inserted into the vagina after wetting it and before sexual intercourse.  Spermicides These are chemicals that kill or block sperm from entering the cervix and uterus. They come in the form of creams, jellies, suppositories, foam, or tablets. They do not require a   prescription. They are inserted into the vagina with an applicator before having sexual intercourse. The process must be repeated every time you have sexual intercourse. INTRAUTERINE CONTRACEPTION  Intrauterine device (IUD) This is a T-shaped device that is put in a woman's uterus during a  menstrual period to prevent pregnancy. There are 2 types:  Copper IUD This type of IUD is wrapped in copper wire and is placed inside the uterus. Copper makes the uterus and fallopian tubes produce a fluid that kills sperm. It can stay in place for 10 years.  Hormone IUD This type of IUD contains the hormone progestin (synthetic progesterone). The hormone thickens the cervical mucus and prevents sperm from entering the uterus, and it also thins the uterine lining to prevent implantation of a fertilized egg. The hormone can weaken or kill the sperm that get into the uterus. It can stay in place for 3 5 years, depending on which type of IUD is used. PERMANENT METHODS OF CONTRACEPTION  Female tubal ligation This is when the woman's fallopian tubes are surgically sealed, tied, or blocked to prevent the egg from traveling to the uterus.  Hysteroscopic sterilization This involves placing a small coil or insert into each fallopian tube. Your doctor uses a technique called hysteroscopy to do the procedure. The device causes scar tissue to form. This results in permanent blockage of the fallopian tubes, so the sperm cannot fertilize the egg. It takes about 3 months after the procedure for the tubes to become blocked. You must use another form of birth control for these 3 months.  Female sterilization This is when the female has the tubes that carry sperm tied off (vasectomy).This blocks sperm from entering the vagina during sexual intercourse. After the procedure, the man can still ejaculate fluid (semen). NATURAL PLANNING METHODS  Natural family planning This is not having sexual intercourse or using a barrier method (condom, diaphragm, cervical cap) on days the woman could become pregnant.  Calendar method This is keeping track of the length of each menstrual cycle and identifying when you are fertile.  Ovulation method This is avoiding sexual intercourse during ovulation.  Symptothermal method This is  avoiding sexual intercourse during ovulation, using a thermometer and ovulation symptoms.  Post ovulation method This is timing sexual intercourse after you have ovulated. Regardless of which type or method of contraception you choose, it is important that you use condoms to protect against the transmission of sexually transmitted infections (STIs). Talk with your health care provider about which form of contraception is most appropriate for you. Document Released: 08/31/2005 Document Revised: 05/03/2013 Document Reviewed: 02/23/2013 [employer] Patient Information 2014 [employer]  

## 2014-03-01 IMAGING — US US FETAL BPP W/O NONSTRESS
1 series · 14 of 15 positions shown · non-contrast
Comparison: none

[Series 1: us fetal bpp w/o nonstress · non-contrast · 15 acquisitions, 14 frames shown]
[im 1/15]
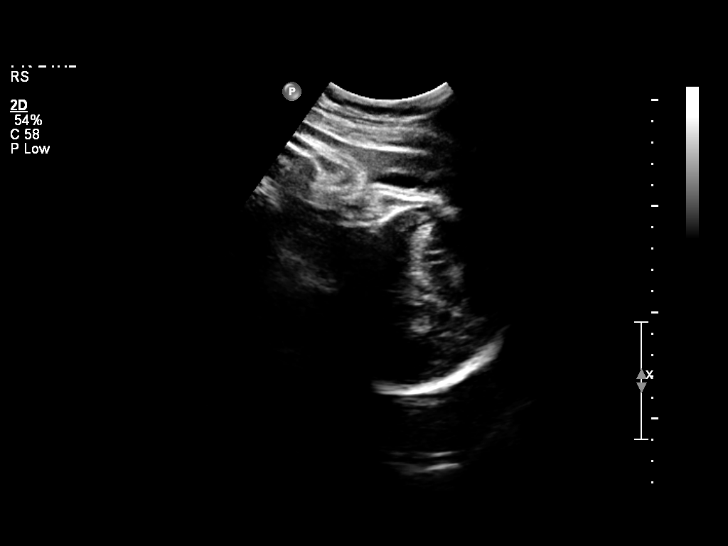
[im 2/15]
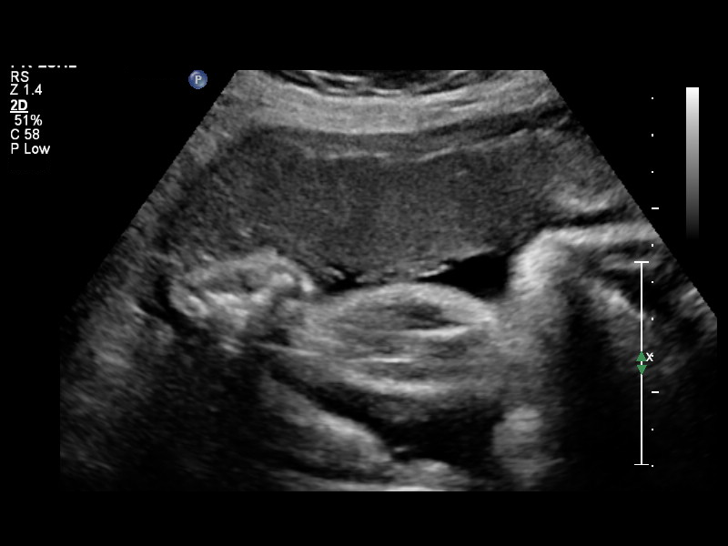
[im 3/15]
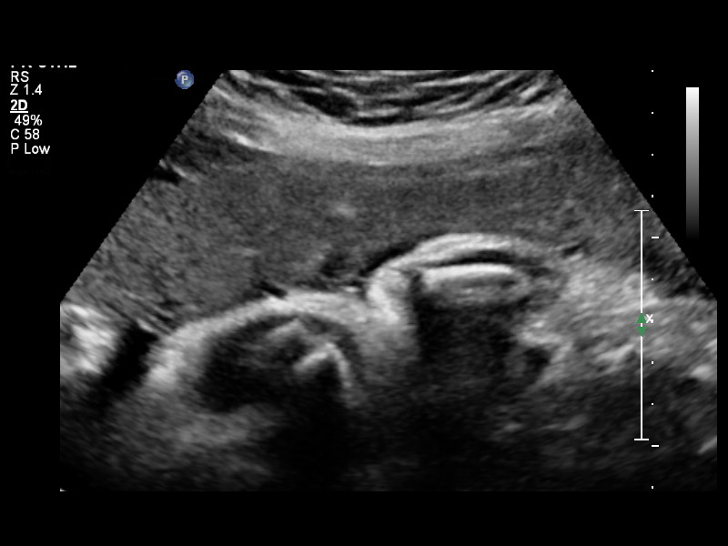
[im 4/15]
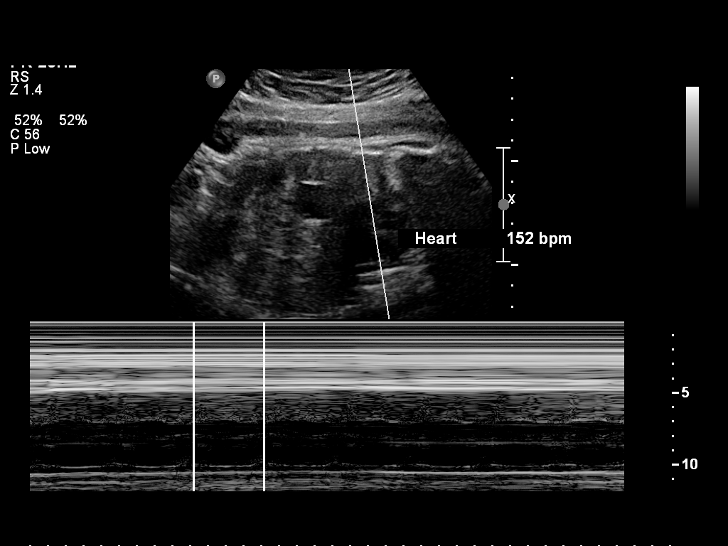
[im 5/15]
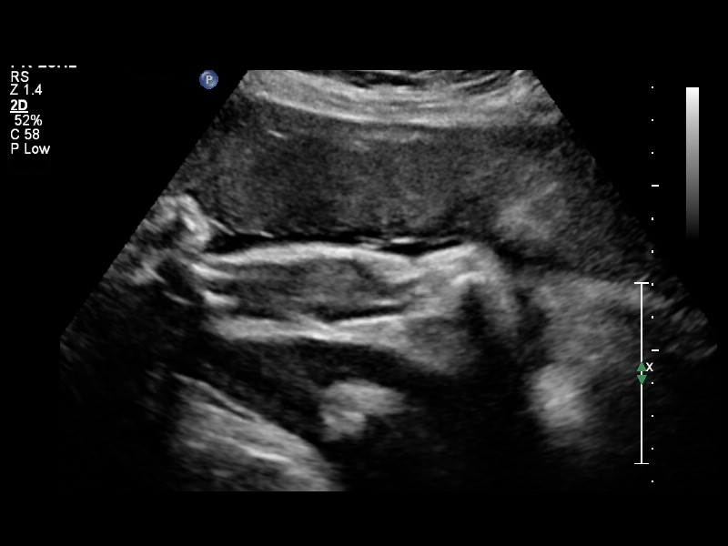
[im 6/15]
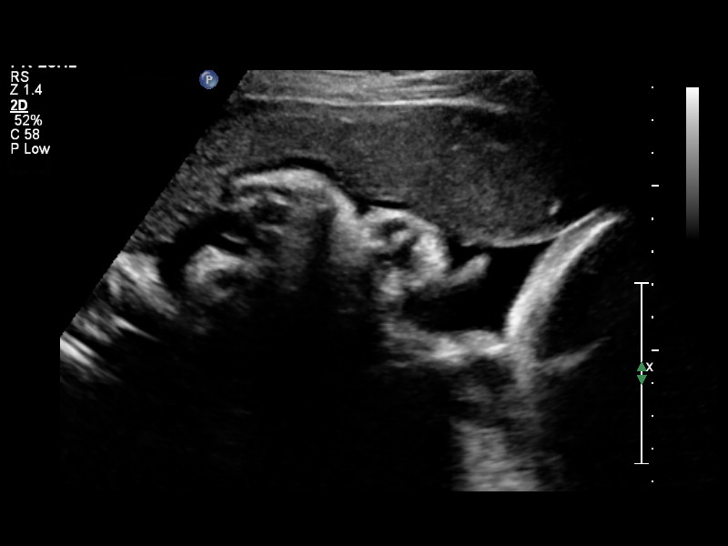
[im 7/15]
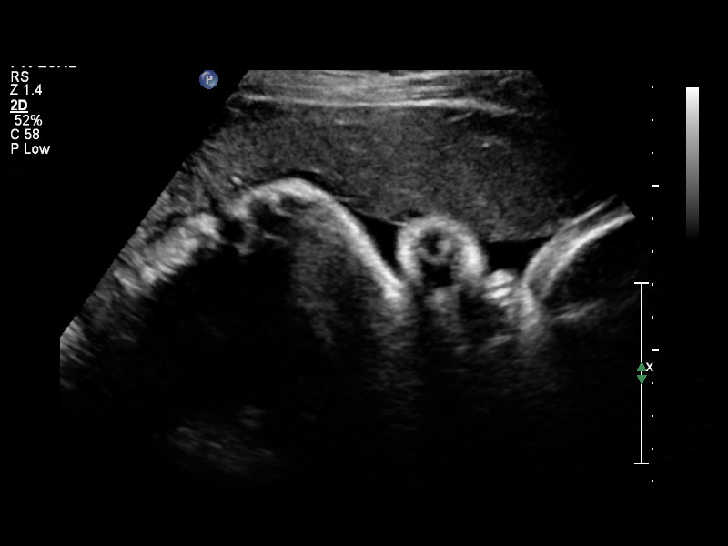
[im 9/15]
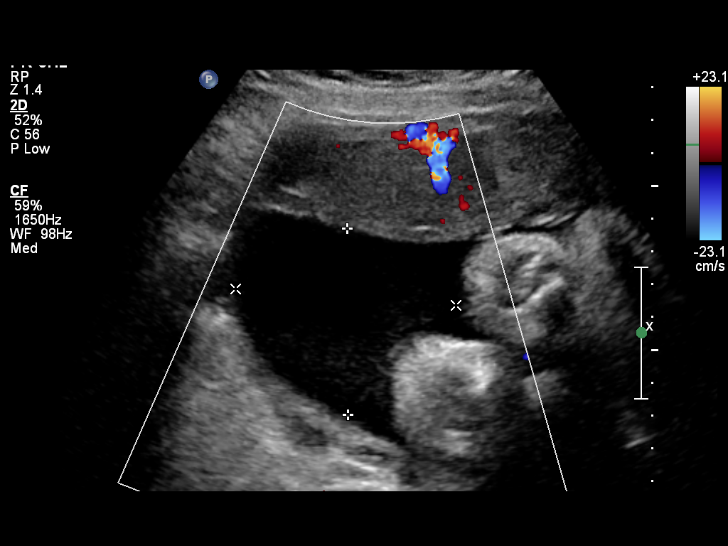
[im 10/15]
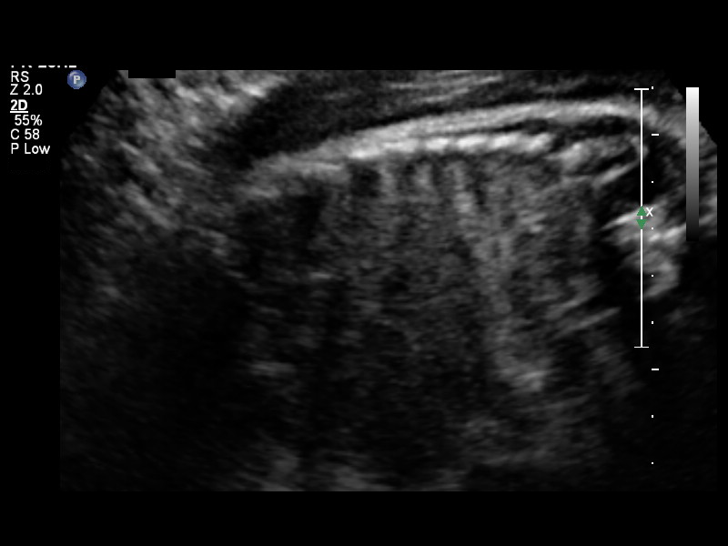
[im 11/15]
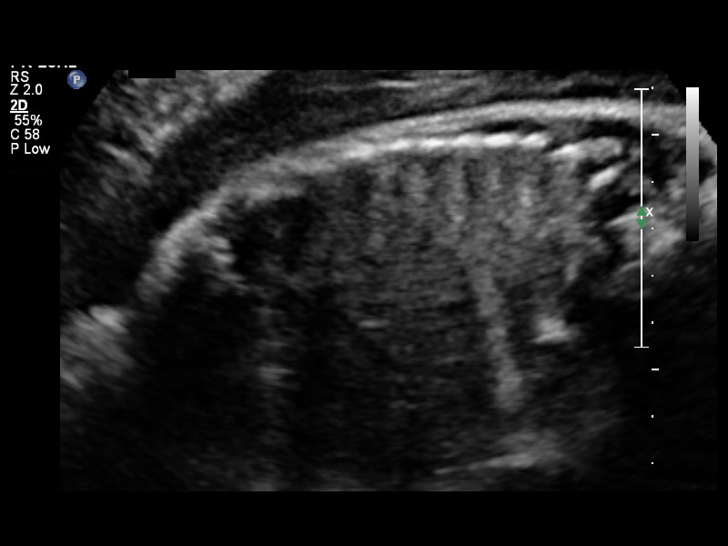
[im 12/15]
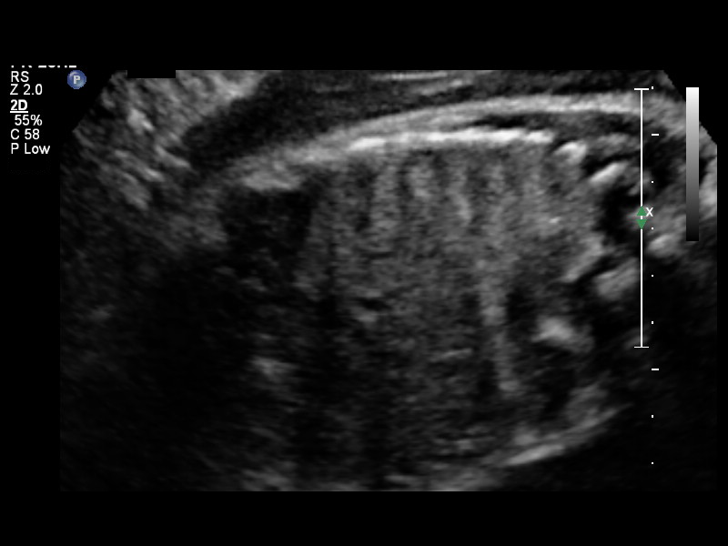
[im 13/15]
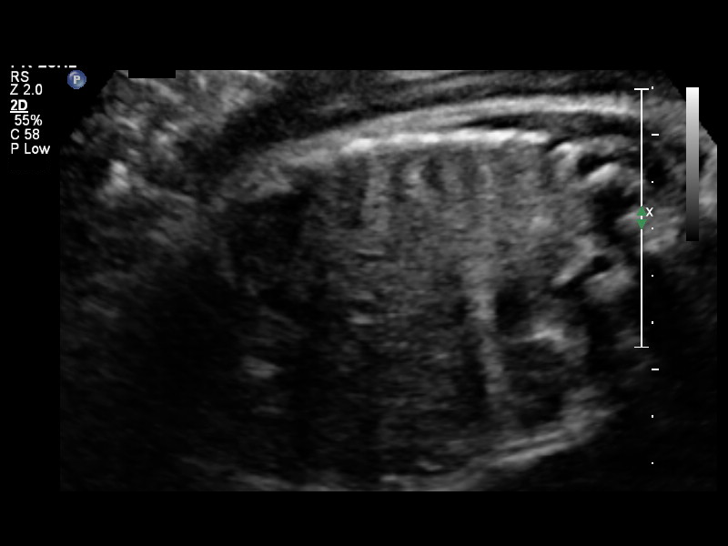
[im 14/15]
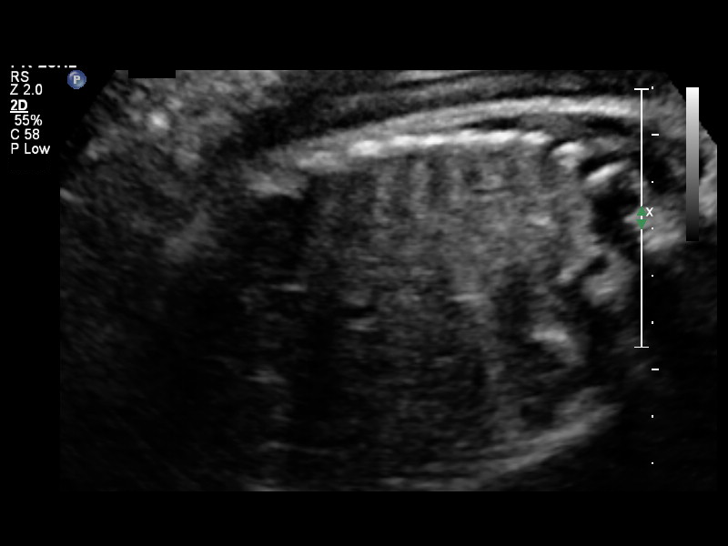
[im 15/15]
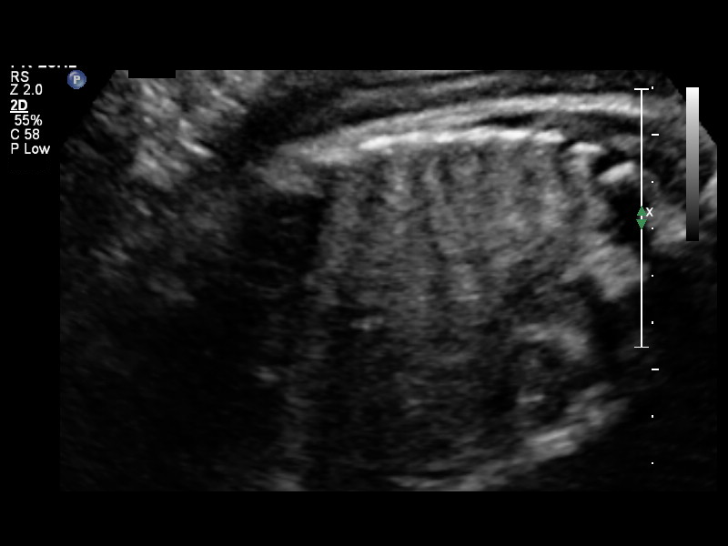

[14 of 15 positions shown; findings below may reference images not displayed]

OBSTETRICS REPORT
                      (Signed Final 11/08/2013 [DATE])

 Name:       ABDIELEILEEN MARIN PIAZZA               Visit Date: 11/08/2013 [DATE]

Service(s) Provided

Indications

 Decreased fetal movement
 Non-reactive NST
Fetal Evaluation

 Num Of Fetuses:    1
 Fetal Heart Rate:  152                          bpm
 Cardiac Activity:  Observed
 Presentation:      Cephalic

 Comment:    [DATE] BPP in 12 mins

 Amniotic Fluid
 AFI FV:      Subjectively within normal limits
                                             Larg Pckt:     5.7  cm
Biophysical Evaluation

 Amniotic F.V:   Within normal limits       F. Tone:        Observed
 F. Movement:    Observed                   Score:          [DATE]
 F. Breathing:   Observed
Gestational Age

 LMP:           37w 5d        Date:  02/17/13                 EDD:   11/24/13
 Best:          37w 5d     Det. By:  LMP  (02/17/13)          EDD:   11/24/13
Impression

 SIUP at 22w5d on BPP interpretation only
 BPP [DATE]
Recommendations

 Follow up as clinically indicated.
 Management per OB provider.

## 2014-03-29 ENCOUNTER — Ambulatory Visit: Payer: Non-veteran care | Admitting: Obstetrics & Gynecology

## 2014-07-16 ENCOUNTER — Encounter: Payer: Self-pay | Admitting: Obstetrics & Gynecology

## 2014-09-10 ENCOUNTER — Encounter: Payer: Self-pay | Admitting: *Deleted

## 2014-09-11 ENCOUNTER — Encounter: Payer: Self-pay | Admitting: Obstetrics & Gynecology

## 2014-09-24 ENCOUNTER — Other Ambulatory Visit (INDEPENDENT_AMBULATORY_CARE_PROVIDER_SITE_OTHER): Admitting: *Deleted

## 2014-09-24 ENCOUNTER — Other Ambulatory Visit: Payer: Self-pay | Admitting: Obstetrics

## 2014-09-24 DIAGNOSIS — Z3202 Encounter for pregnancy test, result negative: Secondary | ICD-10-CM

## 2014-09-24 DIAGNOSIS — Z3200 Encounter for pregnancy test, result unknown: Secondary | ICD-10-CM

## 2014-09-24 DIAGNOSIS — O3680X1 Pregnancy with inconclusive fetal viability, fetus 1: Secondary | ICD-10-CM

## 2014-09-24 LAB — POCT URINE PREGNANCY: PREG TEST UR: NEGATIVE

## 2014-09-24 NOTE — Progress Notes (Signed)
Pt is in office today for UPT.  Pt states that she is late for her cycle.   Pt states that she usually starts cycle on 2nd of the month.  Pt has not yet had cycle for January.  Pt states that she has had negative home test.  UPT in office today is negative.  Pt states that she has been having feelings of nausea.  Pt would like to know if she may have lab work to determine pregnancy since she has previous high risk pregnancies in past.  Pt made aware that lab work is not routinely done with negative results. Pt would like to speak with Dr Clearance CootsHarper as she is not happy with recommendations.  Pt taken to Dr Clearance CootsHarper office, will determine course of care. After pt spoke with Dr Clearance CootsHarper,  HCG Quant was drawn to determine if possible pregnancy.  Pt advised to contact office for results.

## 2014-09-25 ENCOUNTER — Telehealth: Payer: Self-pay | Admitting: *Deleted

## 2014-09-25 LAB — HCG, QUANTITATIVE, PREGNANCY: hCG, Beta Chain, Quant, S: <2 m[IU]/mL

## 2014-09-25 NOTE — Telephone Encounter (Signed)
Patient notified- lab result is non pregnant level.

## 2015-03-20 ENCOUNTER — Encounter: Payer: Self-pay | Admitting: Obstetrics

## 2015-03-20 ENCOUNTER — Other Ambulatory Visit (INDEPENDENT_AMBULATORY_CARE_PROVIDER_SITE_OTHER)

## 2015-03-20 ENCOUNTER — Other Ambulatory Visit: Payer: Self-pay | Admitting: Certified Nurse Midwife

## 2015-03-20 VITALS — BP 123/77 | HR 79 | Temp 98.7°F | Ht 68.0 in | Wt 227.0 lb

## 2015-03-20 DIAGNOSIS — O0991 Supervision of high risk pregnancy, unspecified, first trimester: Secondary | ICD-10-CM

## 2015-03-20 DIAGNOSIS — N926 Irregular menstruation, unspecified: Secondary | ICD-10-CM

## 2015-03-20 DIAGNOSIS — O09291 Supervision of pregnancy with other poor reproductive or obstetric history, first trimester: Secondary | ICD-10-CM

## 2015-03-20 LAB — POCT URINE PREGNANCY: Preg Test, Ur: POSITIVE — AB

## 2015-03-20 NOTE — Progress Notes (Signed)
Patient in office today for a pregnancy test. Patient states she has had a positive pregnancy test. Patient encouraged to continue prenatal vitamins and to schedule her NOB appointment at approximately 10-11 weeks.  BP 123/77 mmHg  Pulse 79  Temp(Src) 98.7 F (37.1 C)  Ht 5\' 8"  (1.727 m)  Wt 227 lb (102.967 kg)  BMI 34.52 kg/m2  LMP 02/15/2015 (Exact Date)

## 2015-04-01 ENCOUNTER — Encounter (HOSPITAL_COMMUNITY): Payer: Self-pay | Admitting: *Deleted

## 2015-04-01 ENCOUNTER — Inpatient Hospital Stay (HOSPITAL_COMMUNITY)

## 2015-04-01 ENCOUNTER — Inpatient Hospital Stay (HOSPITAL_COMMUNITY)
Admission: AD | Admit: 2015-04-01 | Discharge: 2015-04-02 | Disposition: A | Discharge status: Home / Self Care | Source: Ambulatory Visit | Attending: Obstetrics | Admitting: Obstetrics

## 2015-04-01 DIAGNOSIS — O009 Unspecified ectopic pregnancy without intrauterine pregnancy: Secondary | ICD-10-CM

## 2015-04-01 DIAGNOSIS — O209 Hemorrhage in early pregnancy, unspecified: Secondary | ICD-10-CM

## 2015-04-01 LAB — CBC WITH DIFFERENTIAL/PLATELET
BASOS PCT: 0 % (ref 0–1)
Basophils Absolute: 0 10*3/uL (ref 0.0–0.1)
EOS ABS: 0.1 10*3/uL (ref 0.0–0.7)
EOS PCT: 1 % (ref 0–5)
HEMATOCRIT: 34.3 % — AB (ref 36.0–46.0)
HEMOGLOBIN: 11 g/dL — AB (ref 12.0–15.0)
LYMPHS PCT: 33 % (ref 12–46)
Lymphs Abs: 2.3 10*3/uL (ref 0.7–4.0)
MCH: 27.4 pg (ref 26.0–34.0)
MCHC: 32.1 g/dL (ref 30.0–36.0)
MCV: 85.3 fL (ref 78.0–100.0)
MONO ABS: 0.5 10*3/uL (ref 0.1–1.0)
MONOS PCT: 7 % (ref 3–12)
NEUTROS ABS: 4 10*3/uL (ref 1.7–7.7)
NEUTROS PCT: 58 % (ref 43–77)
PLATELETS: 171 10*3/uL (ref 150–400)
RBC: 4.02 MIL/uL (ref 3.87–5.11)
RDW: 14.7 % (ref 11.5–15.5)
WBC: 6.8 10*3/uL (ref 4.0–10.5)

## 2015-04-01 LAB — URINALYSIS, ROUTINE W REFLEX MICROSCOPIC
Bilirubin Urine: NEGATIVE
GLUCOSE, UA: NEGATIVE mg/dL
KETONES UR: NEGATIVE mg/dL
LEUKOCYTES UA: NEGATIVE
NITRITE: NEGATIVE
PROTEIN: NEGATIVE mg/dL
Specific Gravity, Urine: 1.01 (ref 1.005–1.030)
Urobilinogen, UA: 0.2 mg/dL (ref 0.0–1.0)
pH: 6 (ref 5.0–8.0)

## 2015-04-01 LAB — URINE MICROSCOPIC-ADD ON

## 2015-04-01 LAB — WET PREP, GENITAL
Clue Cells Wet Prep HPF POC: NONE SEEN
TRICH WET PREP: NONE SEEN
YEAST WET PREP: NONE SEEN

## 2015-04-01 LAB — HCG, QUANTITATIVE, PREGNANCY: hCG, Beta Chain, Quant, S: 3678 m[IU]/mL — ABNORMAL HIGH (ref <5)

## 2015-04-01 MED ORDER — METHOTREXATE INJECTION FOR WOMEN'S HOSPITAL
50.0000 mg/m2 | Freq: Once | INTRAMUSCULAR | Status: AC
  Administered 2015-04-02: 110 mg via INTRAMUSCULAR
  Filled 2015-04-01: qty 2.2

## 2015-04-01 NOTE — MAU Provider Note (Signed)
History     CSN: [id number]  Arrival date and time: 04/01/15 2005   First Provider Initiated Contact with Patient 04/01/15 2232      No chief complaint on file.  HPI  Cheryl [name] is a 29 y.o. [id number] at [id number] who presents to MAU today with complaint of vaginal bleeding and lower abdominal cramping since 03/27/15. The patient states that bleeding has been very light, mostly spotting. She states that she needs a panty liner for the last few days. She states cramping is mild. She has not taken anything for pain. She denies fever, UTI symptoms, N/V/D today. She has had occasional dizziness. She had a stillbirth with one of her previous pregnancies due to HELLP syndrome.    OB History    Gravida Para Term Preterm AB TAB SAB Ectopic Multiple Living   5 2 1 1 2 1 1  0 0 1      Past Medical History  Diagnosis Date  . Anemia   . HELLP syndrome   . Preterm labor   . Pregnancy induced hypertension     Past Surgical History  Procedure Laterality Date  . None    . No past surgeries      Family History  Problem Relation Age of Onset  . Cancer Maternal Grandmother     Bladder  . Diabetes Maternal Grandmother   . Diabetes Maternal Grandfather   . Diabetes Mother     History  Substance Use Topics  . Smoking status: Never Smoker   . Smokeless tobacco: Never Used  . Alcohol Use: No    Allergies:  Allergies  Allergen Reactions  . Amoxicillin Rash and Swelling  . Flagyl [Metronidazole] Swelling and Rash  . Latex Rash  . Penicillins Swelling and Rash    No prescriptions prior to admission    Review of Systems  Constitutional: Negative for fever and malaise/fatigue.  Gastrointestinal: Positive for abdominal pain. Negative for nausea, vomiting, diarrhea and constipation.  Genitourinary: Negative for dysuria, urgency and frequency.       + vaginal bleeding   Physical Exam   Blood pressure 132/73, pulse 73, temperature 98.6 F (37 C), temperature source Oral,  resp. rate 18, height 5\' 8"  (1.727 m), weight 227 lb (102.967 kg), last menstrual period 02/15/2015, SpO2 100 %, not currently breastfeeding.  Physical Exam  Nursing note and vitals reviewed. Constitutional: She is oriented to person, place, and time. She appears well-developed and well-nourished. No distress.  HENT:  Head: Normocephalic and atraumatic.  Cardiovascular: Normal rate.   Respiratory: Effort normal.  GI: Soft. She exhibits no distension and no mass. There is no tenderness. There is no rebound and no guarding.  Genitourinary: Uterus is not enlarged and not tender. Cervix exhibits no motion tenderness, no discharge and no friability. Right adnexum displays no mass and no tenderness. Left adnexum displays no mass and no tenderness. There is bleeding (scant blood) in the vagina. Vaginal discharge (scant thin, white, mucus discharge) found.  Neurological: She is alert and oriented to person, place, and time.  Skin: Skin is warm and dry. No erythema.  Psychiatric: She has a normal mood and affect.   Results for orders placed or performed during the hospital encounter of 04/01/15 (from the past 24 hour(s))  Urinalysis, Routine w reflex microscopic ([facility])     Status: Abnormal   Collection Time: 04/01/15  9:20 PM  Result Value Ref Range   Color, Urine YELLOW YELLOW   APPearance CLEAR CLEAR  Specific Gravity, Urine 1.010 1.005 - 1.030   pH 6.0 5.0 - 8.0   Glucose, UA NEGATIVE NEGATIVE mg/dL   Hgb urine dipstick LARGE (A) NEGATIVE   Bilirubin Urine NEGATIVE NEGATIVE   Ketones, ur NEGATIVE NEGATIVE mg/dL   Protein, ur NEGATIVE NEGATIVE mg/dL   Urobilinogen, UA 0.2 0.0 - 1.0 mg/dL   Nitrite NEGATIVE NEGATIVE   Leukocytes, UA NEGATIVE NEGATIVE  Urine microscopic-add on     Status: None   Collection Time: 04/01/15  9:20 PM  Result Value Ref Range   Squamous Epithelial / LPF RARE RARE   WBC, UA 0-2 <3 WBC/hpf   RBC / HPF 3-6 <3 RBC/hpf   Bacteria, UA RARE RARE  CBC with  Differential/Platelet     Status: Abnormal   Collection Time: 04/01/15 10:31 PM  Result Value Ref Range   WBC 6.8 4.0 - 10.5 K/uL   RBC 4.02 3.87 - 5.11 MIL/uL   Hemoglobin 11.0 (L) 12.0 - 15.0 g/dL   HCT 40.9 (L) 81.1 - 91.4 %   MCV 85.3 78.0 - 100.0 fL   MCH 27.4 26.0 - 34.0 pg   MCHC 32.1 30.0 - 36.0 g/dL   RDW 78.2 95.6 - 21.3 %   Platelets 171 150 - 400 K/uL   Neutrophils Relative % 58 43 - 77 %   Neutro Abs 4.0 1.7 - 7.7 K/uL   Lymphocytes Relative 33 12 - 46 %   Lymphs Abs 2.3 0.7 - 4.0 K/uL   Monocytes Relative 7 3 - 12 %   Monocytes Absolute 0.5 0.1 - 1.0 K/uL   Eosinophils Relative 1 0 - 5 %   Eosinophils Absolute 0.1 0.0 - 0.7 K/uL   Basophils Relative 0 0 - 1 %   Basophils Absolute 0.0 0.0 - 0.1 K/uL  hCG, quantitative, pregnancy     Status: Abnormal   Collection Time: 04/01/15 10:31 PM  Result Value Ref Range   hCG, Beta Chain, Quant, S 3678 (H) <5 mIU/mL  AST     Status: None   Collection Time: 04/01/15 10:31 PM  Result Value Ref Range   AST 29 15 - 41 U/L  BUN     Status: None   Collection Time: 04/01/15 10:31 PM  Result Value Ref Range   BUN 10 6 - 20 mg/dL  Creatinine, serum     Status: None   Collection Time: 04/01/15 10:31 PM  Result Value Ref Range   Creatinine, Ser 0.81 0.44 - 1.00 mg/dL   GFR calc non Af Amer >60 >60 mL/min   GFR calc Af Amer >60 >60 mL/min  Wet prep, genital     Status: Abnormal   Collection Time: 04/01/15 11:25 PM  Result Value Ref Range   Yeast Wet Prep HPF POC NONE SEEN NONE SEEN   Trich, Wet Prep NONE SEEN NONE SEEN   Clue Cells Wet Prep HPF POC NONE SEEN NONE SEEN   WBC, Wet Prep HPF POC FEW (A) NONE SEEN   US Ob Comp Less 14 Wks  04/01/2015   CLINICAL DATA:  29 year old pregnant female with vaginal bleeding  EXAM: OBSTETRIC <14 WK [place] AND TRANSVAGINAL OB US  TECHNIQUE: Both transabdominal and transvaginal ultrasound examinations were performed for complete evaluation of the gestation as well as the maternal uterus, adnexal  regions, and pelvic cul-de-sac. Transvaginal technique was performed to assess early pregnancy.  COMPARISON:  None.  FINDINGS: The uterus is retroverted.  No intrauterine pregnancy identified.  There is a 1.9 x  1.0 x 1.5 cm complex cystic and solid lesion in the region of the right adnexa between the ovary and the uterus. This lesion appears to be separate from the right ovary and is suspicious for an ectopic pregnancy.  The right ovary measures 3.0 x 2.0 by 1.4 cm and the left ovary measures 2.9 x 2.7 x 1.7 cm. The ovaries appear unremarkable.  Small free fluid noted within the pelvis.  IMPRESSION: No intrauterine pregnancy identified. Complex predominantly solid lesion in the region of the right adnexa is suspicious for an ectopic pregnancy. Clinical correlation and obstetrical consult is advised.  Critical Value/emergent results were called by telephone at the time of interpretation on 04/01/2015 at 11:21 pm to physician assistant Vonzella Nipple , who verbally acknowledged these results.   Electronically Signed   By: Elgie Collard M.D.   On: 04/01/2015 23:24   US Ob Transvaginal  04/01/2015   CLINICAL DATA:  28 year old pregnant female with vaginal bleeding  EXAM: OBSTETRIC <14 WK [place] AND TRANSVAGINAL OB US  TECHNIQUE: Both transabdominal and transvaginal ultrasound examinations were performed for complete evaluation of the gestation as well as the maternal uterus, adnexal regions, and pelvic cul-de-sac. Transvaginal technique was performed to assess early pregnancy.  COMPARISON:  None.  FINDINGS: The uterus is retroverted.  No intrauterine pregnancy identified.  There is a 1.9 x 1.0 x 1.5 cm complex cystic and solid lesion in the region of the right adnexa between the ovary and the uterus. This lesion appears to be separate from the right ovary and is suspicious for an ectopic pregnancy.  The right ovary measures 3.0 x 2.0 by 1.4 cm and the left ovary measures 2.9 x 2.7 x 1.7 cm. The ovaries appear  unremarkable.  Small free fluid noted within the pelvis.  IMPRESSION: No intrauterine pregnancy identified. Complex predominantly solid lesion in the region of the right adnexa is suspicious for an ectopic pregnancy. Clinical correlation and obstetrical consult is advised.  Critical Value/emergent results were called by telephone at the time of interpretation on 04/01/2015 at 11:21 pm to physician assistant Vonzella Nipple , who verbally acknowledged these results.   Electronically Signed   By: Elgie Collard M.D.   On: 04/01/2015 23:24    MAU Course  Procedures None  MDM +UPT in the office last week UA, wet prep, GC/chlamydia, CBC, quant hCG, HIV, RPR and [place] today to rule out ectopic pregnancy Discussed patient with Dr. Gaynell Face, he agrees with plan for MTX today. Patient will follow-up in MAU for day 4 and 7 quant hCG Additional labs ordered - WNL MTX ordered and given today Assessment and Plan  A: Ectopic pregnancy  P: Discharge home Advised Tylenol PRN for pain Bleeding/ectopic precautions discussed Patient advised to follow-up in MAU on Tuesday for day #4 labs or sooner if symptoms worsen  Cheryl Lowenstein, PA-C  04/02/2015, 1:48 AM

## 2015-04-01 NOTE — MAU Note (Signed)
Pt complains of spotting and mild lower abd pain.

## 2015-04-02 DIAGNOSIS — O009 Ectopic pregnancy, unspecified: Secondary | ICD-10-CM | POA: Diagnosis not present

## 2015-04-02 LAB — AST: AST: 29 U/L (ref 15–41)

## 2015-04-02 LAB — CREATININE, SERUM
Creatinine, Ser: 0.81 mg/dL (ref 0.44–1.00)
GFR calc Af Amer: >60 mL/min (ref >60)
GFR calc non Af Amer: >60 mL/min (ref >60)

## 2015-04-02 LAB — HIV ANTIBODY (ROUTINE TESTING W REFLEX): HIV Screen 4th Generation wRfx: NONREACTIVE

## 2015-04-02 LAB — BUN: BUN: 10 mg/dL (ref 6–20)

## 2015-04-02 LAB — GC/CHLAMYDIA PROBE AMP (~~LOC~~) NOT AT ARMC
Chlamydia: NEGATIVE
NEISSERIA GONORRHEA: NEGATIVE

## 2015-04-02 LAB — RPR: RPR Ser Ql: NONREACTIVE

## 2015-04-02 NOTE — Discharge Instructions (Signed)
Methotrexate Treatment for an Ectopic Pregnancy, Care After ° °Refer to this sheet in the next few weeks. These instructions provide you with information on caring for yourself after your procedure. Your health care provider may also give you more specific instructions. Your treatment has been planned according to current medical practices, but problems sometimes occur. Call your health care provider if you have any problems or questions after your procedure. ° °WHAT TO EXPECT AFTER THE PROCEDURE °You may have some abdominal cramping, vaginal bleeding, and fatigue in the first few days after taking methotrexate. Some other possible side effects of methotrexate include: °· Nausea. °· Vomiting. °· Diarrhea. °· Mouth sores. °· Swelling or irritation of the lining of your lungs (pneumonitis). °· Liver damage. °· Hair loss. °·  °HOME CARE INSTRUCTIONS  °After you have received the methotrexate medicine, you need to be careful of your activities and watch your condition for several weeks. It may take 1 week before your hormone levels return to normal. °· Keep all follow-up appointments as directed by your health care provider. °· Avoid traveling too far away from your health care provider. °· Do not have sexual intercourse until your health care provider says it is safe to do so. °· You may resume your usual diet. °· Limit strenuous activity. °· Do not take folic acid, prenatal vitamins, or other vitamins that contain folic acid. °· Do not take aspirin, ibuprofen, or naproxen (nonsteroidal anti-inflammatory drugs [NSAIDs]). °· Do not drink alcohol. ° °SEEK MEDICAL CARE IF:  °· You cannot control your nausea and vomiting. °· You cannot control your diarrhea. °· You have sores in your mouth and want treatment. °· You need pain medicine for your abdominal pain. °· You have a rash. °· You are having a reaction to the medicine. °SEEK IMMEDIATE MEDICAL CARE IF:  °· You have increasing abdominal or pelvic pain. °· You notice  increased bleeding. °· You feel light-headed, or you faint. °· You have shortness of breath. °· Your heart rate increases. °· You have a cough. °· You have chills. °· You have a fever. °Document Released: 08/20/2011 Document Revised: 09/05/2013 Document Reviewed: 06/19/2013 °ExitCare® Patient Information ©2015 [employer]. This information is not intended to replace advice given to you by your health care provider. Make sure you discuss any questions you have with your health care provider. ° °Ectopic Pregnancy °An ectopic pregnancy is when the fertilized egg attaches (implants) outside the uterus. Most ectopic pregnancies occur in the fallopian tube. Rarely do ectopic pregnancies occur on the ovary, intestine, pelvis, or cervix. In an ectopic pregnancy, the fertilized egg does not have the ability to develop into a normal, healthy baby.  °A ruptured ectopic pregnancy is one in which the fallopian tube gets torn or bursts and results in internal bleeding. Often there is intense abdominal pain, and sometimes, vaginal bleeding. Having an ectopic pregnancy can be life threatening. If left untreated, this dangerous condition can lead to a blood transfusion, abdominal surgery, or even death. °CAUSES  °Damage to the fallopian tubes is the suspected cause in most ectopic pregnancies.  °RISK FACTORS °Depending on your circumstances, the risk of having an ectopic pregnancy will vary. The level of risk can be divided into three categories. °High Risk °· You have gone through infertility treatment. °· You have had a previous ectopic pregnancy. °· You have had previous tubal surgery. °· You have had previous surgery to have the fallopian tubes tied (tubal ligation). °· You have tubal problems or diseases. °·   You have been exposed to DES. DES is a medicine that was used until 1971 and had effects on babies whose mothers took the medicine. °· You become pregnant while using an intrauterine device (IUD) for birth  control.  °Moderate Risk °· You have a history of infertility. °· You have a history of a sexually transmitted infection (STI). °· You have a history of pelvic inflammatory disease (PID). °· You have scarring from endometriosis. °· You have multiple sexual partners. °· You smoke.  °Low Risk °· You have had previous pelvic surgery. °· You use vaginal douching. °· You became sexually active before 31 years of age. °SIGNS AND SYMPTOMS  °An ectopic pregnancy should be suspected in anyone who has missed a period and has abdominal pain or bleeding. °· You may experience normal pregnancy symptoms, such as: °¨ Nausea. °¨ Tiredness. °¨ Breast tenderness. °· Other symptoms may include: °¨ Pain with intercourse. °¨ Irregular vaginal bleeding or spotting. °¨ Cramping or pain on one side or in the lower abdomen. °¨ Fast heartbeat. °¨ Passing out while having a bowel movement. °· Symptoms of a ruptured ectopic pregnancy and internal bleeding may include: °¨ Sudden, severe pain in the abdomen and pelvis. °¨ Dizziness or fainting. °¨ Pain in the shoulder area. °DIAGNOSIS  °Tests that may be performed include: °· A pregnancy test. °· An ultrasound test. °· Testing the specific level of pregnancy hormone in the bloodstream. °· Taking a sample of uterus tissue (dilation and curettage, D&C). °· Surgery to perform a visual exam of the inside of the abdomen using a thin, lighted tube with a tiny camera on the end (laparoscope). °TREATMENT  °An injection of a medicine called methotrexate may be given. This medicine causes the pregnancy tissue to be absorbed. It is given if: °· The diagnosis is made early. °· The fallopian tube has not ruptured. °· You are considered to be a good candidate for the medicine. °Usually, pregnancy hormone blood levels are checked after methotrexate treatment. This is to be sure the medicine is effective. It may take 4-6 weeks for the pregnancy to be absorbed (though most pregnancies will be absorbed by 3  weeks). °Surgical treatment may be needed. A laparoscope may be used to remove the pregnancy tissue. If severe internal bleeding occurs, a cut (incision) may be made in the lower abdomen (laparotomy), and the ectopic pregnancy is removed. This stops the bleeding. Part of the fallopian tube, or the whole tube, may be removed as well (salpingectomy). After surgery, pregnancy hormone tests may be done to be sure there is no pregnancy tissue left. You may receive a Rho (D) immune globulin shot if you are Rh negative and the father is Rh positive, or if you do not know the Rh type of the father. This is to prevent problems with any future pregnancy. °SEEK IMMEDIATE MEDICAL CARE IF:  °You have any symptoms of an ectopic pregnancy. This is a medical emergency. °MAKE SURE YOU: °· Understand these instructions. °· Will watch your condition. °· Will get help right away if you are not doing well or get worse. °Document Released: 10/08/2004 Document Revised: 01/15/2014 Document Reviewed: 03/30/2013 °ExitCare® Patient Information ©2015 [employer]. This information is not intended to replace advice given to you by your health care provider. Make sure you discuss any questions you have with your health care provider. ° °

## 2015-04-03 ENCOUNTER — Telehealth: Payer: Self-pay | Admitting: *Deleted

## 2015-04-03 NOTE — Telephone Encounter (Signed)
Patient contacted the office asking if we have received records from the hospital regarding her visit to MAU on 04-01-15. She was informed she has and ectopic pregnancy.  Attempted to contact the patient and left message for patient to call the office.

## 2015-04-04 NOTE — Telephone Encounter (Signed)
Spoke with patient and advised her we had the same charting system as the [facility] and we could view her records. Patient states her husband is in the [employer] and is away. Patient states she has contacted the [employer] to have him sent home. Patient states she needs to have [place] contact the [employer] to verify her diagnosis and the need for her husband to come home. Patient gave phone number for [facility] [phone] and her case number is [id number].   Contacted the [employer] on Mrs. Swenson's behalf and gave information requested by the [employer].

## 2015-04-05 ENCOUNTER — Inpatient Hospital Stay (HOSPITAL_COMMUNITY)
Admission: AD | Admit: 2015-04-05 | Discharge: 2015-04-05 | Disposition: A | Discharge status: Home / Self Care | Source: Ambulatory Visit | Attending: Obstetrics | Admitting: Obstetrics

## 2015-04-05 DIAGNOSIS — O009 Unspecified ectopic pregnancy without intrauterine pregnancy: Secondary | ICD-10-CM

## 2015-04-05 LAB — HCG, QUANTITATIVE, PREGNANCY: HCG, BETA CHAIN, QUANT, S: 5336 m[IU]/mL — AB (ref <5)

## 2015-04-05 NOTE — MAU Note (Signed)
Had MTX injection for ectopic 7/18. Here for repeat labs and poss u/s. Still having some cramping and vag bleeding. Bleeding is less and uses 3-4 pads daily

## 2015-04-05 NOTE — Discharge Instructions (Signed)
Methotrexate Treatment for an Ectopic Pregnancy, Care After °Refer to this sheet in the next few weeks. These instructions provide you with information on caring for yourself after your procedure. Your health care provider may also give you more specific instructions. Your treatment has been planned according to current medical practices, but problems sometimes occur. Call your health care provider if you have any problems or questions after your procedure. °WHAT TO EXPECT AFTER THE PROCEDURE °You may have some abdominal cramping, vaginal bleeding, and fatigue in the first few days after taking methotrexate. Some other possible side effects of methotrexate include: °· Nausea. °· Vomiting. °· Diarrhea. °· Mouth sores. °· Swelling or irritation of the lining of your lungs (pneumonitis). °· Liver damage. °· Hair loss. °HOME CARE INSTRUCTIONS  °After you have received the methotrexate medicine, you need to be careful of your activities and watch your condition for several weeks. It may take 1 week before your hormone levels return to normal. °· Keep all follow-up appointments as directed by your health care provider. °· Avoid traveling too far away from your health care provider. °· Do not have sexual intercourse until your health care provider says it is safe to do so. °· You may resume your usual diet. °· Limit strenuous activity. °· Do not take folic acid, prenatal vitamins, or other vitamins that contain folic acid. °· Do not take aspirin, ibuprofen, or naproxen (nonsteroidal anti-inflammatory drugs [NSAIDs]). °· Do not drink alcohol. °SEEK MEDICAL CARE IF:  °· You cannot control your nausea and vomiting. °· You cannot control your diarrhea. °· You have sores in your mouth and want treatment. °· You need pain medicine for your abdominal pain. °· You have a rash. °· You are having a reaction to the medicine. °SEEK IMMEDIATE MEDICAL CARE IF:  °· You have increasing abdominal or pelvic pain. °· You notice increased  bleeding. °· You feel light-headed, or you faint. °· You have shortness of breath. °· Your heart rate increases. °· You have a cough. °· You have chills. °· You have a fever. °Document Released: 08/20/2011 Document Revised: 09/05/2013 Document Reviewed: 06/19/2013 °ExitCare® Patient Information ©2015 [employer]. This information is not intended to replace advice given to you by your health care provider. Make sure you discuss any questions you have with your health care provider. ° °

## 2015-04-05 NOTE — MAU Note (Signed)
Pamelia Hoit NP in Triage with pt and family to discuss test results and d/c plan. PT d/c home from Triage

## 2015-04-05 NOTE — MAU Provider Note (Signed)
CSN: [id number]     Arrival date & time 04/05/15  1916 History   None    Chief Complaint  Patient presents with  . Follow-up     (Consider location/radiation/quality/duration/timing/severity/associated sxs/prior Treatment) HPI  Pt is f/u HCG after receiving MTX on 04/02/2015 for ectopic- still having some cramping and vaginal bleeding but has diminished since last seen  Results for Cheryl, [name] (MRN [id number]) as of 04/05/2015 20:59  Ref. Range 04/01/2015 21:20 04/01/2015 22:31 04/01/2015 23:08 04/01/2015 23:25 04/05/2015 19:35  HCG, Beta Chain, Quant, S Latest Ref Range: <5 mIU/mL  3678 (H)   5336 (H)  RN note: Had MTX injection for ectopic 7/18. Here for repeat labs and poss u/s. Still having some cramping and vag bleeding. Bleeding is less and uses 3-4 pads daily Past Medical History  Diagnosis Date  . Anemia   . HELLP syndrome   . Preterm labor   . Pregnancy induced hypertension    Past Surgical History  Procedure Laterality Date  . None    . No past surgeries     Family History  Problem Relation Age of Onset  . Cancer Maternal Grandmother     Bladder  . Diabetes Maternal Grandmother   . Diabetes Maternal Grandfather   . Diabetes Mother    History  Substance Use Topics  . Smoking status: Never Smoker   . Smokeless tobacco: Never Used  . Alcohol Use: No   OB History    Gravida Para Term Preterm AB TAB SAB Ectopic Multiple Living   0 0 1     Review of Systems  See HPI  Allergies  Amoxicillin; Flagyl; Latex; and Penicillins  Home Medications   Prior to Admission medications   Medication Sig Start Date End Date Taking? Authorizing Provider  acetaminophen (TYLENOL) 500 MG tablet Take 1,000 mg by mouth every 6 (six) hours as needed for mild pain.   Yes Historical Provider, MD   BP 132/84 mmHg  Pulse 74  Temp(Src) 98.4 F (36.9 C)  Resp 18  Ht  (1.727 m)  Wt 227 lb (102.967 kg)  BMI 34.52 kg/m2  LMP 02/15/2015 (Exact Date) Physical  Exam  Constitutional: She is oriented to person, place, and time. She appears well-developed and well-nourished. No distress.  HENT:  Head: Normocephalic.  Eyes: Pupils are equal, round, and reactive to light.  Neck: Normal range of motion. Neck supple.  Pulmonary/Chest: Effort normal.  Musculoskeletal: Normal range of motion.  Neurological: She is alert and oriented to person, place, and time.  Skin: Skin is warm and dry.  Psychiatric: She has a normal mood and affect.  Nursing note and vitals reviewed.   ED Course  Procedures (including critical care time) Labs Review Labs Reviewed  HCG, QUANTITATIVE, PREGNANCY - Abnormal; Notable for the following:    hCG, Beta Chain, Quant, S 5336 (*)    All other components within normal limits   Discussed with Dr. Clearance Coots- labs today (Day 4) have increased- will re-evaluate Day 7 for possible 2nd injection Pt will return on Day 7 for repeat labs Ectopic precautions discussed with pt and husband Imaging Review No results found.   EKG Interpretation None      MDM   Final diagnoses:  None  Ectopic pregnancy repeat HCG on Day 7- order placed Ectopic precautions

## 2015-04-06 ENCOUNTER — Inpatient Hospital Stay (HOSPITAL_COMMUNITY)

## 2015-04-06 ENCOUNTER — Inpatient Hospital Stay (HOSPITAL_COMMUNITY)
Admission: AD | Admit: 2015-04-06 | Discharge: 2015-04-06 | Disposition: A | Discharge status: Home / Self Care | Source: Ambulatory Visit | Attending: Obstetrics | Admitting: Obstetrics

## 2015-04-06 ENCOUNTER — Encounter (HOSPITAL_COMMUNITY): Payer: Self-pay

## 2015-04-06 DIAGNOSIS — R109 Unspecified abdominal pain: Secondary | ICD-10-CM

## 2015-04-06 DIAGNOSIS — O009 Unspecified ectopic pregnancy without intrauterine pregnancy: Secondary | ICD-10-CM

## 2015-04-06 DIAGNOSIS — O26899 Other specified pregnancy related conditions, unspecified trimester: Secondary | ICD-10-CM

## 2015-04-06 LAB — URINALYSIS, ROUTINE W REFLEX MICROSCOPIC
BILIRUBIN URINE: NEGATIVE
Glucose, UA: NEGATIVE mg/dL
Ketones, ur: NEGATIVE mg/dL
Leukocytes, UA: NEGATIVE
Nitrite: NEGATIVE
Protein, ur: NEGATIVE mg/dL
Specific Gravity, Urine: 1.02 (ref 1.005–1.030)
Urobilinogen, UA: 0.2 mg/dL (ref 0.0–1.0)
pH: 6.5 (ref 5.0–8.0)

## 2015-04-06 LAB — CBC
HCT: 34.4 % — ABNORMAL LOW (ref 36.0–46.0)
Hemoglobin: 11 g/dL — ABNORMAL LOW (ref 12.0–15.0)
MCH: 27.4 pg (ref 26.0–34.0)
MCHC: 32 g/dL (ref 30.0–36.0)
MCV: 85.6 fL (ref 78.0–100.0)
PLATELETS: 161 10*3/uL (ref 150–400)
RBC: 4.02 MIL/uL (ref 3.87–5.11)
RDW: 14.5 % (ref 11.5–15.5)
WBC: 5.7 10*3/uL (ref 4.0–10.5)

## 2015-04-06 LAB — URINE MICROSCOPIC-ADD ON

## 2015-04-06 LAB — HCG, QUANTITATIVE, PREGNANCY: HCG, BETA CHAIN, QUANT, S: 5119 m[IU]/mL — AB (ref <5)

## 2015-04-06 MED ORDER — OXYCODONE-ACETAMINOPHEN 5-325 MG PO TABS: 1.0000 | ORAL_TABLET | ORAL | Status: DC | PRN

## 2015-04-06 NOTE — MAU Provider Note (Signed)
History     CSN: [id number]  Arrival date and time: 04/06/15 1426   None     Chief Complaint  Patient presents with  . Abdominal Pain   HPI Cheryl [name] 29 y.o. [redacted]w[redacted]d  Was here yesterday for day 4 quant after methotrexate for ectopic pregnancy on July 18.  Since this morning, she has had more right sided pain.  Also has had one episode of sharp pains under the rib cage on the right and left side.  Also reports being lightheaded and dizzy.  Note reviewed from visit yesterday.  OB History    Gravida Para Term Preterm AB TAB SAB Ectopic Multiple Living   0 0 1      Past Medical History  Diagnosis Date  . Anemia   . HELLP syndrome   . Preterm labor   . Pregnancy induced hypertension     Past Surgical History  Procedure Laterality Date  . None    . No past surgeries      Family History  Problem Relation Age of Onset  . Cancer Maternal Grandmother     Bladder  . Diabetes Maternal Grandmother   . Diabetes Maternal Grandfather   . Diabetes Mother     History  Substance Use Topics  . Smoking status: Never Smoker   . Smokeless tobacco: Never Used  . Alcohol Use: No    Allergies:  Allergies  Allergen Reactions  . Amoxicillin Rash and Swelling  . Flagyl [Metronidazole] Swelling and Rash  . Latex Rash  . Penicillins Swelling and Rash    Prescriptions prior to admission  Medication Sig Dispense Refill Last Dose  . acetaminophen (TYLENOL) 500 MG tablet Take 1,000 mg by mouth every 6 (six) hours as needed for mild pain.   04/05/2015 at Unknown time    Review of Systems  Constitutional: Negative for fever.  Gastrointestinal: Positive for abdominal pain. Negative for nausea and vomiting.  Genitourinary:       No vaginal discharge. No vaginal bleeding. No dysuria.  Neurological: Positive for dizziness and weakness.   Physical Exam   Blood pressure 148/73, pulse 93, temperature 98.3 F (36.8 C), temperature source Oral, resp. rate 18, last  menstrual period 02/15/2015, not currently breastfeeding.  Physical Exam  Nursing note and vitals reviewed. Constitutional: She is oriented to person, place, and time. She appears well-developed and well-nourished.  HENT:  Head: Normocephalic.  Eyes: EOM are normal.  Neck: Neck supple.  GI: Soft. There is tenderness. There is no rebound and no guarding.  Tender in RLQ  Musculoskeletal: Normal range of motion.  Neurological: She is alert and oriented to person, place, and time.  Skin: Skin is warm and dry.  Psychiatric: She has a normal mood and affect.    MAU Course  Procedures CLINICAL DATA: Followup ectopic pregnancy, methotrexate injection 04/01/2015. Beta HCG on 04/01/2015 was 3698, 5336 on 04/05/2015, and 5119 on 04/06/2015.  EXAM: TRANSVAGINAL OB ULTRASOUND  TECHNIQUE: Transvaginal ultrasound was performed for complete evaluation of the gestation as well as the maternal uterus, adnexal regions, and pelvic cul-de-sac.  COMPARISON: 04/01/2015  FINDINGS: Intrauterine gestational sac: No intrauterine gestational sac is identified. Again noted at the right lateral uterine fundus, is a gestational sac measuring 2.6 x 1.2 x 0.9 cm. No internal yolk sac, fetal pole, or cardiac activity is identified. This is not significantly changed in size or appearance since previously.  At real time imaging, this mass is seen  to move separately from the right lateral uterine fundus and does not appear to be covered by myometrium to suggest a cornual ectopic pregnancy.  Maternal uterus/adnexae: Small free fluid in the cul-de-sac. Imaging of the right upper quadrant demonstrates no fluid in Morison's pouch.  IMPRESSION: No significant change in right adnexal ectopic gestational sac without internal yolk sac or fetal pole identified.  Results for orders placed or performed during the hospital encounter of 04/06/15 (from the past 24 hour(s))  Urinalysis, Routine w reflex  microscopic (not at [facility])     Status: Abnormal   Collection Time: 04/06/15  2:33 PM  Result Value Ref Range   Color, Urine YELLOW YELLOW   APPearance HAZY (A) CLEAR   Specific Gravity, Urine 1.020 1.005 - 1.030   pH 6.5 5.0 - 8.0   Glucose, UA NEGATIVE NEGATIVE mg/dL   Hgb urine dipstick LARGE (A) NEGATIVE   Bilirubin Urine NEGATIVE NEGATIVE   Ketones, ur NEGATIVE NEGATIVE mg/dL   Protein, ur NEGATIVE NEGATIVE mg/dL   Urobilinogen, UA 0.2 0.0 - 1.0 mg/dL   Nitrite NEGATIVE NEGATIVE   Leukocytes, UA NEGATIVE NEGATIVE  Urine microscopic-add on     Status: Abnormal   Collection Time: 04/06/15  2:33 PM  Result Value Ref Range   Squamous Epithelial / LPF FEW (A) RARE   WBC, UA 0-2 <3 WBC/hpf   RBC / HPF 21-50 <3 RBC/hpf  CBC     Status: Abnormal   Collection Time: 04/06/15  2:56 PM  Result Value Ref Range   WBC 5.7 4.0 - 10.5 K/uL   RBC 4.02 3.87 - 5.11 MIL/uL   Hemoglobin 11.0 (L) 12.0 - 15.0 g/dL   HCT 91.4 (L) 78.2 - 95.6 %   MCV 85.6 78.0 - 100.0 fL   MCH 27.4 26.0 - 34.0 pg   MCHC 32.0 30.0 - 36.0 g/dL   RDW 21.3 08.6 - 57.8 %   Platelets 161 150 - 400 K/uL  hCG, quantitative, pregnancy     Status: Abnormal   Collection Time: 04/06/15  2:56 PM  Result Value Ref Range   hCG, Beta Chain, Quant, S 5119 (H) <5 mIU/mL     MDM 1725  Consult with Dr. Clearance Coots - reviewed labs and ultrasound results - may go home with percocet if needed for pain.  Client currently reassured by labs and ultrasound and would like to go home.  Discussed option of being admitted for observation.  Client prefers to go home.  Declines pain medication before leaving the hospital.  Pain decreases when resting in bed.  Cautioned to have decreased activity at home.  Will get pain medication from pharmacy on the way home to have if needed for pain.   Assessment and Plan  Stable ectopic pregnancy with falling quant and abdominal pain, no free fluid on ultrasound Still unknown course but some evidence of  ectopic beginning to reduce  Plan Reviewed pelvic rest - no sex, no tampons, no douching No strenuous activity - exercise, house work, Catering manager. Return with heavy bleeding or increased pain. Return for repeat quant on Monday. Call 911 if symptoms are severe or loses consciousness Rx given for Percocet - may take 1-2 tablets as needed.  BURLESON,TERRI 04/06/2015, 2:58 PM

## 2015-04-06 NOTE — Discharge Instructions (Signed)
Reviewed pelvic rest - no sex, no tampons, no douching No strenuous activity - exercise, house work, lifting, etc. Return with heavy bleeding or increased pain. Return for repeat quant on Monday, July 25 Call 911 if symptoms are severe or loses consciousness

## 2015-04-08 ENCOUNTER — Inpatient Hospital Stay (HOSPITAL_COMMUNITY)
Admission: AD | Admit: 2015-04-08 | Discharge: 2015-04-08 | Disposition: A | Discharge status: Home / Self Care | Source: Ambulatory Visit | Attending: Obstetrics & Gynecology | Admitting: Obstetrics & Gynecology

## 2015-04-08 DIAGNOSIS — O009 Unspecified ectopic pregnancy without intrauterine pregnancy: Secondary | ICD-10-CM

## 2015-04-08 LAB — CBC WITH DIFFERENTIAL/PLATELET
BASOS PCT: 0 % (ref 0–1)
Basophils Absolute: 0 10*3/uL (ref 0.0–0.1)
EOS ABS: 0.1 10*3/uL (ref 0.0–0.7)
Eosinophils Relative: 1 % (ref 0–5)
HCT: 35.5 % — ABNORMAL LOW (ref 36.0–46.0)
HEMOGLOBIN: 11.3 g/dL — AB (ref 12.0–15.0)
LYMPHS PCT: 36 % (ref 12–46)
Lymphs Abs: 2 10*3/uL (ref 0.7–4.0)
MCH: 27.6 pg (ref 26.0–34.0)
MCHC: 31.8 g/dL (ref 30.0–36.0)
MCV: 86.8 fL (ref 78.0–100.0)
MONOS PCT: 5 % (ref 3–12)
Monocytes Absolute: 0.3 10*3/uL (ref 0.1–1.0)
NEUTROS ABS: 3.2 10*3/uL (ref 1.7–7.7)
Neutrophils Relative %: 58 % (ref 43–77)
PLATELETS: 181 10*3/uL (ref 150–400)
RBC: 4.09 MIL/uL (ref 3.87–5.11)
RDW: 14.7 % (ref 11.5–15.5)
WBC: 5.6 10*3/uL (ref 4.0–10.5)

## 2015-04-08 LAB — BUN: BUN: 12 mg/dL (ref 6–20)

## 2015-04-08 LAB — CREATININE, SERUM
CREATININE: 0.79 mg/dL (ref 0.44–1.00)
GFR calc Af Amer: >60 mL/min (ref >60)

## 2015-04-08 LAB — HCG, QUANTITATIVE, PREGNANCY: hCG, Beta Chain, Quant, S: 4819 m[IU]/mL — ABNORMAL HIGH (ref <5)

## 2015-04-08 LAB — AST: AST: 18 U/L (ref 15–41)

## 2015-04-08 MED ORDER — METHOTREXATE INJECTION FOR WOMEN'S HOSPITAL
50.0000 mg/m2 | Freq: Once | INTRAMUSCULAR | Status: AC
  Administered 2015-04-08: 110 mg via INTRAMUSCULAR
  Filled 2015-04-08: qty 2.2

## 2015-04-08 MED ORDER — PROMETHAZINE HCL 25 MG PO TABS: 12.5000 mg | ORAL_TABLET | Freq: Four times a day (QID) | ORAL | Status: DC | PRN

## 2015-04-08 NOTE — MAU Provider Note (Signed)
Chief Complaint: Follow-up   None     SUBJECTIVE HPI: Cheryl [name] is a 29 y.o. [id number] at [redacted]w[redacted]d by LMP who presents to maternity admissions for Day 7 labs following MTX therapy for ectopic pregnancy.  She denies abdominal pain, vaginal bleeding, vaginal itching/burning, urinary symptoms, h/a, dizziness, n/v, or fever/chills.    Quant hcg on Day 1 MTX: 3678, then 5336 on Day 4, then 4819 on Day 4.  HPI  Past Medical History  Diagnosis Date  . Anemia   . HELLP syndrome   . Preterm labor   . Pregnancy induced hypertension    Past Surgical History  Procedure Laterality Date  . None    . No past surgeries     History   Social History  . Marital Status: Married    Spouse Name: N/A  . Number of Children: N/A  . Years of Education: N/A   Occupational History  . Armed Officer/Security   . Student    Social History Main Topics  . Smoking status: Never Smoker   . Smokeless tobacco: Never Used  . Alcohol Use: No  . Drug Use: No  . Sexual Activity:    Partners: Male    Birth Control/ Protection:    Other Topics Concern  . Not on file   Social History Narrative   Lives at home with husband.     No current facility-administered medications on file prior to encounter.   Current Outpatient Prescriptions on File Prior to Encounter  Medication Sig Dispense Refill  . acetaminophen (TYLENOL) 500 MG tablet Take 1,000 mg by mouth every 6 (six) hours as needed for mild pain.    Marland Kitchen oxyCODONE-acetaminophen (PERCOCET/ROXICET) 5-325 MG per tablet Take 1-2 tablets by mouth every 4 (four) hours as needed for severe pain. 6 tablet 0   Allergies  Allergen Reactions  . Amoxicillin Rash and Swelling  . Flagyl [Metronidazole] Swelling and Rash  . Latex Rash  . Penicillins Swelling and Rash    Review of Systems  Constitutional: Negative for fever, chills and malaise/fatigue.  Eyes: Negative for blurred vision.  Respiratory: Negative for cough and shortness of breath.    Cardiovascular: Negative for chest pain.  Gastrointestinal: Negative for heartburn, nausea, vomiting and abdominal pain.  Genitourinary: Negative for dysuria, urgency and frequency.  Musculoskeletal: Negative.   Neurological: Negative for dizziness and headaches.  Psychiatric/Behavioral: Negative for depression.    OBJECTIVE Blood pressure 125/71, pulse 86, temperature 98.6 F (37 C), temperature source Oral, resp. rate 18, last menstrual period 02/15/2015, not currently breastfeeding. GENERAL: Well-developed, well-nourished female in no acute distress.  EYES: normal sclera/conjunctiva; no lid-lag HENT: Atraumatic, normocephalic HEART: normal rate RESP: normal effort ABDOMEN: Soft, non-tender MUSCULOSKELETAL: Normal ROM EXTREMITIES: Nontender, no edema NEURO/PSYCH: Alert and oriented, appropriate affect GU: PELVIC EXAM: Deferred   LAB RESULTS Results for orders placed or performed during the hospital encounter of 04/08/15 (from the past 24 hour(s))  hCG, quantitative, pregnancy     Status: Abnormal   Collection Time: 04/08/15  1:50 PM  Result Value Ref Range   hCG, Beta Chain, Quant, S 4819 (H) <5 mIU/mL  CBC WITH DIFFERENTIAL     Status: Abnormal   Collection Time: 04/08/15  3:50 PM  Result Value Ref Range   WBC 5.6 4.0 - 10.5 K/uL   RBC 4.09 3.87 - 5.11 MIL/uL   Hemoglobin 11.3 (L) 12.0 - 15.0 g/dL   HCT 45.4 (L) 09.8 - 11.9 %   MCV 86.8 78.0 - 100.0  fL   MCH 27.6 26.0 - 34.0 pg   MCHC 31.8 30.0 - 36.0 g/dL   RDW 16.1 09.6 - 04.5 %   Platelets 181 150 - 400 K/uL   Neutrophils Relative % 58 43 - 77 %   Neutro Abs 3.2 1.7 - 7.7 K/uL   Lymphocytes Relative 36 12 - 46 %   Lymphs Abs 2.0 0.7 - 4.0 K/uL   Monocytes Relative 5 3 - 12 %   Monocytes Absolute 0.3 0.1 - 1.0 K/uL   Eosinophils Relative 1 0 - 5 %   Eosinophils Absolute 0.1 0.0 - 0.7 K/uL   Basophils Relative 0 0 - 1 %   Basophils Absolute 0.0 0.0 - 0.1 K/uL  AST     Status: None   Collection Time: 04/08/15   3:50 PM  Result Value Ref Range   AST 18 15 - 41 U/L  BUN     Status: None   Collection Time: 04/08/15  3:50 PM  Result Value Ref Range   BUN 12 6 - 20 mg/dL  Creatinine, serum     Status: None   Collection Time: 04/08/15  3:50 PM  Result Value Ref Range   Creatinine, Ser 0.79 0.44 - 1.00 mg/dL   GFR calc non Af Amer >60 >60 mL/min   GFR calc Af Amer >60 >60 mL/min    O POS  IMAGING US Ob Comp Less 14 Wks  04/01/2015   CLINICAL DATA:  29 year old pregnant female with vaginal bleeding  EXAM: OBSTETRIC <14 WK [place] AND TRANSVAGINAL OB US  TECHNIQUE: Both transabdominal and transvaginal ultrasound examinations were performed for complete evaluation of the gestation as well as the maternal uterus, adnexal regions, and pelvic cul-de-sac. Transvaginal technique was performed to assess early pregnancy.  COMPARISON:  None.  FINDINGS: The uterus is retroverted.  No intrauterine pregnancy identified.  There is a 1.9 x 1.0 x 1.5 cm complex cystic and solid lesion in the region of the right adnexa between the ovary and the uterus. This lesion appears to be separate from the right ovary and is suspicious for an ectopic pregnancy.  The right ovary measures 3.0 x 2.0 by 1.4 cm and the left ovary measures 2.9 x 2.7 x 1.7 cm. The ovaries appear unremarkable.  Small free fluid noted within the pelvis.  IMPRESSION: No intrauterine pregnancy identified. Complex predominantly solid lesion in the region of the right adnexa is suspicious for an ectopic pregnancy. Clinical correlation and obstetrical consult is advised.  Critical Value/emergent results were called by telephone at the time of interpretation on 04/01/2015 at 11:21 pm to physician assistant Vonzella Nipple , who verbally acknowledged these results.   Electronically Signed   By: Elgie Collard M.D.   On: 04/01/2015 23:24   US Ob Transvaginal  04/06/2015   CLINICAL DATA:  Followup ectopic pregnancy, methotrexate injection 04/01/2015. Beta HCG on 04/01/2015 was  3698, 5336 on 04/05/2015, and 5119 on 04/06/2015.  EXAM: TRANSVAGINAL OB ULTRASOUND  TECHNIQUE: Transvaginal ultrasound was performed for complete evaluation of the gestation as well as the maternal uterus, adnexal regions, and pelvic cul-de-sac.  COMPARISON:  04/01/2015  FINDINGS: Intrauterine gestational sac: No intrauterine gestational sac is identified. Again noted at the right lateral uterine fundus, is a gestational sac measuring 2.6 x 1.2 x 0.9 cm. No internal yolk sac, fetal pole, or cardiac activity is identified. This is not significantly changed in size or appearance since previously.  At real time imaging, this mass is seen to move  separately from the right lateral uterine fundus and does not appear to be covered by myometrium to suggest a cornual ectopic pregnancy.  Maternal uterus/adnexae: Small free fluid in the cul-de-sac. Imaging of the right upper quadrant demonstrates no fluid in Morison's pouch.  IMPRESSION: No significant change in right adnexal ectopic gestational sac without internal yolk sac or fetal pole identified.   Electronically Signed   By: Christiana Pellant M.D.   On: 04/06/2015 16:58   US Ob Transvaginal  04/01/2015   CLINICAL DATA:  29 year old pregnant female with vaginal bleeding  EXAM: OBSTETRIC <14 WK [place] AND TRANSVAGINAL OB US  TECHNIQUE: Both transabdominal and transvaginal ultrasound examinations were performed for complete evaluation of the gestation as well as the maternal uterus, adnexal regions, and pelvic cul-de-sac. Transvaginal technique was performed to assess early pregnancy.  COMPARISON:  None.  FINDINGS: The uterus is retroverted.  No intrauterine pregnancy identified.  There is a 1.9 x 1.0 x 1.5 cm complex cystic and solid lesion in the region of the right adnexa between the ovary and the uterus. This lesion appears to be separate from the right ovary and is suspicious for an ectopic pregnancy.  The right ovary measures 3.0 x 2.0 by 1.4 cm and the left ovary  measures 2.9 x 2.7 x 1.7 cm. The ovaries appear unremarkable.  Small free fluid noted within the pelvis.  IMPRESSION: No intrauterine pregnancy identified. Complex predominantly solid lesion in the region of the right adnexa is suspicious for an ectopic pregnancy. Clinical correlation and obstetrical consult is advised.  Critical Value/emergent results were called by telephone at the time of interpretation on 04/01/2015 at 11:21 pm to physician assistant Vonzella Nipple , who verbally acknowledged these results.   Electronically Signed   By: Elgie Collard M.D.   On: 04/01/2015 23:24   MAU Management: Discussed quant hcg results with pt which did not drop by recommended percentage following MTX therapy.  Because pt is stable today with no evidence of ruptured ectopic, second dose of MTX offered.  Pt took time to discuss this with her husband and decided to stay today for second dose of MTX and second week of close follow up.  Labs drawn and results reviewed, then dose of MTX given in MAU.  Pt stable at time of discharge.   ASSESSMENT 1. Ectopic pregnancy     PLAN Consult Dr Debroah Loop. Reviewed assessment and labs. Second dose of MTX given to day in MAU Discharge home with ectopic precautions Return to MAU on Thursday for Day 4 labs, then on Sunday for Day 7 labs Return sooner as needed for emergencies    Medication List    TAKE these medications        acetaminophen 500 MG tablet  Commonly known as:  TYLENOL  Take 1,000 mg by mouth every 6 (six) hours as needed for mild pain.     oxyCODONE-acetaminophen 5-325 MG per tablet  Commonly known as:  PERCOCET/ROXICET  Take 1-2 tablets by mouth every 4 (four) hours as needed for severe pain.     promethazine 25 MG tablet  Commonly known as:  PHENERGAN  Take 0.5-1 tablets (12.5-25 mg total) by mouth every 6 (six) hours as needed.           Follow-up Information    Follow up with [facility] [place] [facility].   Why:   Return on Thursday 04/11/15 for Day 4 repeat labs.  Then return on Sunday 04/14/15 for Day 7 labs.  Return sooner as needed for emergencies   Contact information:   [address] [id number] mc [place] [address] [phone]      Sharen Counter Certified Nurse-Midwife 04/08/2015  5:47 PM

## 2015-04-08 NOTE — Discharge Instructions (Signed)
Methotrexate Treatment for an Ectopic Pregnancy, Care After °Refer to this sheet in the next few weeks. These instructions provide you with information on caring for yourself after your procedure. Your health care provider may also give you more specific instructions. Your treatment has been planned according to current medical practices, but problems sometimes occur. Call your health care provider if you have any problems or questions after your procedure. °WHAT TO EXPECT AFTER THE PROCEDURE °You may have some abdominal cramping, vaginal bleeding, and fatigue in the first few days after taking methotrexate. Some other possible side effects of methotrexate include: °· Nausea. °· Vomiting. °· Diarrhea. °· Mouth sores. °· Swelling or irritation of the lining of your lungs (pneumonitis). °· Liver damage. °· Hair loss. °HOME CARE INSTRUCTIONS  °After you have received the methotrexate medicine, you need to be careful of your activities and watch your condition for several weeks. It may take 1 week before your hormone levels return to normal. °· Keep all follow-up appointments as directed by your health care provider. °· Avoid traveling too far away from your health care provider. °· Do not have sexual intercourse until your health care provider says it is safe to do so. °· You may resume your usual diet. °· Limit strenuous activity. °· Do not take folic acid, prenatal vitamins, or other vitamins that contain folic acid. °· Do not take aspirin, ibuprofen, or naproxen (nonsteroidal anti-inflammatory drugs [NSAIDs]). °· Do not drink alcohol. °SEEK MEDICAL CARE IF:  °· You cannot control your nausea and vomiting. °· You cannot control your diarrhea. °· You have sores in your mouth and want treatment. °· You need pain medicine for your abdominal pain. °· You have a rash. °· You are having a reaction to the medicine. °SEEK IMMEDIATE MEDICAL CARE IF:  °· You have increasing abdominal or pelvic pain. °· You notice increased  bleeding. °· You feel light-headed, or you faint. °· You have shortness of breath. °· Your heart rate increases. °· You have a cough. °· You have chills. °· You have a fever. °Document Released: 08/20/2011 Document Revised: 09/05/2013 Document Reviewed: 06/19/2013 °ExitCare® Patient Information ©2015 [employer]. This information is not intended to replace advice given to you by your health care provider. Make sure you discuss any questions you have with your health care provider. ° °

## 2015-04-08 NOTE — MAU Note (Signed)
Pt here for F/U BHCG post MTX day 7.  Pt C/O mild cramping, still bleeding - has changed one pad today.

## 2015-04-11 ENCOUNTER — Inpatient Hospital Stay (HOSPITAL_COMMUNITY)
Admission: AD | Admit: 2015-04-11 | Discharge: 2015-04-11 | Disposition: A | Discharge status: Home / Self Care | Source: Ambulatory Visit | Attending: Obstetrics | Admitting: Obstetrics

## 2015-04-11 DIAGNOSIS — O009 Unspecified ectopic pregnancy without intrauterine pregnancy: Secondary | ICD-10-CM

## 2015-04-11 LAB — HCG, QUANTITATIVE, PREGNANCY: HCG, BETA CHAIN, QUANT, S: 4119 m[IU]/mL — AB (ref <5)

## 2015-04-11 NOTE — MAU Note (Signed)
Things got bad after 2nd injection, increased nausea.  Things are better now, light bleeding and less cramping.

## 2015-04-11 NOTE — MAU Provider Note (Signed)
History     CSN: [id number]  Arrival date and time: 04/11/15 1104   None   Chief Complaint  Patient presents with  . Follow-up  . Ectopic Pregnancy   HPI 29 y.o. [id number] at [redacted]w[redacted]d with known ectopic pregnancy s/p MXT on 7/18 and 7/25 presenting today for repeat bHCG.   Patient has been seen several times here in the MAU. First visit was on 7/18 and dx with right ectopic on [place]. She received MXT at this visit after consult with Dr. Gaynell Face. She was seen on 7/22 for repeat bchg which was discussed with Dr. Clearance Coots. She returned on 7/23 for repeat hcg as well as 7/25. Given her non-lowering bHCG she received another dose of MXT  She is now 10d from her first dose of MXT and 3 from her second.   Reports nausea that comes and goes since getting the MXT.  Light bleeding,- went from spotting to heavy after 1st shot and now light again.  No abdominal pain, cramping Denies dizziness/light headedness.    Past Medical History  Diagnosis Date  . Anemia   . HELLP syndrome   . Preterm labor   . Pregnancy induced hypertension     Past Surgical History  Procedure Laterality Date  . None    . No past surgeries      Family History  Problem Relation Age of Onset  . Cancer Maternal Grandmother     Bladder  . Diabetes Maternal Grandmother   . Diabetes Maternal Grandfather   . Diabetes Mother     History  Substance Use Topics  . Smoking status: Never Smoker   . Smokeless tobacco: Never Used  . Alcohol Use: No    Allergies:  Allergies  Allergen Reactions  . Amoxicillin Rash and Swelling  . Flagyl [Metronidazole] Swelling and Rash  . Latex Rash  . Penicillins Swelling and Rash    Prescriptions prior to admission  Medication Sig Dispense Refill Last Dose  . acetaminophen (TYLENOL) 500 MG tablet Take 1,000 mg by mouth every 6 (six) hours as needed for mild pain.   Past Week at Unknown time  . oxyCODONE-acetaminophen (PERCOCET/ROXICET) 5-325 MG per tablet Take 1-2 tablets by  mouth every 4 (four) hours as needed for severe pain. 6 tablet 0 04/07/2015 at Unknown time  . promethazine (PHENERGAN) 25 MG tablet Take 0.5-1 tablets (12.5-25 mg total) by mouth every 6 (six) hours as needed. 30 tablet 0     Review of Systems  Constitutional: Negative for fever and chills.  Eyes: Negative for blurred vision and double vision.  Respiratory: Negative for cough and shortness of breath.   Cardiovascular: Negative for chest pain and orthopnea.  Gastrointestinal: Positive for nausea (Since 7/18, intermittent in nature). Negative for vomiting.  Genitourinary: Negative for dysuria, frequency and flank pain.  Musculoskeletal: Negative for myalgias.  Skin: Negative for rash.  Neurological: Negative for dizziness, tingling, weakness and headaches.  Endo/Heme/Allergies: Does not bruise/bleed easily.  Psychiatric/Behavioral: Negative for depression and suicidal ideas. The patient is not nervous/anxious.    Physical Exam   Blood pressure 136/81, pulse 77, temperature 98.8 F (37.1 C), temperature source Oral, resp. rate 18, last menstrual period 02/15/2015, not currently breastfeeding.  Physical Exam  Nursing note and vitals reviewed. Constitutional: She is oriented to person, place, and time. She appears well-developed and well-nourished. No distress.  Conversant, appropriate and answers questions.   HENT:  Head: Normocephalic and atraumatic.  Eyes: Conjunctivae are normal. No scleral icterus.  Neck: Normal  range of motion. Neck supple.  Cardiovascular: Normal rate and intact distal pulses.   Respiratory: Effort normal. She exhibits no tenderness.  GI: Soft. There is no tenderness. There is no rebound and no guarding.  Soft. No peritoneal signs.   Genitourinary: Vagina normal.  Musculoskeletal: Normal range of motion. She exhibits no edema.  Neurological: She is alert and oriented to person, place, and time.  Skin: Skin is warm and dry. No rash noted.  Psychiatric: She has  a normal mood and affect.    MAU Course  Procedures  MDM Repeat bHCG  Lab Results  Component Value Date   HCGBETAQNT 4119* 04/11/2015   HCGBETAQNT 4819* 04/08/2015   HCGBETAQNT 5119* 04/06/2015   HCGBETAQNT 5336* 04/05/2015   HCGBETAQNT 1610* 04/01/2015   Last [place] on 7/23 with stable right adnexal ectopic  Assessment and Plan  29 y.o. [id number] at [redacted]w[redacted]d with known ectopic pregnancy s/p MXT on 7/18 and 7/25 presenting today for repeat bHCG.   #Right ectopic pregnancy: Patient has received two doses of mxt with  a 20% drop in bHCG Quant since 2nd dose which was nearly 5d prior. She is currently stable with no VS abnormalities or abdominal pain/peritoneal signs. I am currently not worried about ruptured ectopi. I have discussed the case with Dr. Clearance Coots and he recommend repeat quant at 7d from and he was comfortable with the drop in bhcg since 7/25.   I discussed with the patient the need to return on 7th day after administration of MXT (August 1st). If continued decrease in bHCG, would need next on Aug 8th. We reviewed reasons to return sooner and typical ectopic precautions. She voiced understanding and was sent home.    Isa Rankin [facility] 04/11/2015, 1:43 PM

## 2015-04-15 ENCOUNTER — Inpatient Hospital Stay (HOSPITAL_COMMUNITY)

## 2015-04-15 ENCOUNTER — Inpatient Hospital Stay (HOSPITAL_COMMUNITY)
Admission: AD | Admit: 2015-04-15 | Discharge: 2015-04-15 | Disposition: A | Discharge status: Home / Self Care | Source: Ambulatory Visit | Attending: Obstetrics | Admitting: Obstetrics

## 2015-04-15 ENCOUNTER — Encounter (HOSPITAL_COMMUNITY): Payer: Self-pay | Admitting: *Deleted

## 2015-04-15 DIAGNOSIS — O009 Unspecified ectopic pregnancy without intrauterine pregnancy: Secondary | ICD-10-CM

## 2015-04-15 DIAGNOSIS — R1031 Right lower quadrant pain: Secondary | ICD-10-CM | POA: Diagnosis present

## 2015-04-15 LAB — HCG, QUANTITATIVE, PREGNANCY: HCG, BETA CHAIN, QUANT, S: 2596 m[IU]/mL — AB (ref <5)

## 2015-04-15 NOTE — MAU Note (Signed)
Pt here for F/U BHCG, 2 doses of MTX.  Pt was seen at hospital at [facility] over the weekend because of RLQ pain, BHCG was approx 2800.  Continues to have intermittent RLQ pain, denies bleeding.

## 2015-04-15 NOTE — MAU Provider Note (Signed)
History     CSN: [id number]  Arrival date and time: 04/15/15 1833   None     Chief Complaint  Patient presents with  . Follow-up   HPI  Here for repeat HCG Pt has had 2 doses of MTX- 2nd dose given one week ago Pt is concerned b/c she has been having RLQ pain- intermittent Results for TNIA, ANGLADA (MRN [id number]) as of 04/15/2015 20:06  Ref. Range 04/06/2015 16:46 04/08/2015 13:50 04/08/2015 15:50 04/11/2015 11:32 04/15/2015 19:02  HCG, Beta Chain, Quant, S Latest Ref Range: <5 mIU/mL  4819 (H)  4119 (H) 2596 (H)  RN note: Pt here for F/U BHCG, 2 doses of MTX. Pt was seen at hospital at [facility] over the weekend because of RLQ pain, BHCG was approx 2800. Continues to have intermittent RLQ pain, denies bleeding.         Past Medical History  Diagnosis Date  . Anemia   . HELLP syndrome   . Preterm labor   . Pregnancy induced hypertension     Past Surgical History  Procedure Laterality Date  . None    . No past surgeries      Family History  Problem Relation Age of Onset  . Cancer Maternal Grandmother     Bladder  . Diabetes Maternal Grandmother   . Diabetes Maternal Grandfather   . Diabetes Mother     History  Substance Use Topics  . Smoking status: Never Smoker   . Smokeless tobacco: Never Used  . Alcohol Use: No    Allergies:  Allergies  Allergen Reactions  . Amoxicillin Rash and Swelling  . Flagyl [Metronidazole] Swelling and Rash  . Latex Rash  . Penicillins Swelling and Rash    Prescriptions prior to admission  Medication Sig Dispense Refill Last Dose  . acetaminophen (TYLENOL) 500 MG tablet Take 1,000 mg by mouth every 6 (six) hours as needed for mild pain.   Past Week at Unknown time  . oxyCODONE-acetaminophen (PERCOCET/ROXICET) 5-325 MG per tablet Take 1-2 tablets by mouth every 4 (four) hours as needed for severe pain. 6 tablet 0 04/07/2015 at Unknown time  . promethazine (PHENERGAN) 25 MG tablet Take 0.5-1 tablets (12.5-25 mg total) by  mouth every 6 (six) hours as needed. 30 tablet 0     Review of Systems  Constitutional: Negative for fever and chills.  Gastrointestinal: Positive for abdominal pain. Negative for nausea, vomiting, diarrhea and constipation.   Physical Exam   Blood pressure 136/71, pulse 79, temperature 98.3 F (36.8 C), temperature source Oral, resp. rate 16, last menstrual period 02/15/2015, not currently breastfeeding.  Physical Exam  Nursing note and vitals reviewed. Constitutional: She is oriented to person, place, and time. She appears well-developed and well-nourished. No distress.  HENT:  Head: Normocephalic.  Eyes: Pupils are equal, round, and reactive to light.  Neck: Normal range of motion. Neck supple.  Cardiovascular: Normal rate.   Respiratory: Effort normal.  GI: Soft. She exhibits no distension. There is tenderness. There is no rebound.  Musculoskeletal: Normal range of motion.  Neurological: She is alert and oriented to person, place, and time.  Skin: Skin is warm and dry.  Psychiatric: She has a normal mood and affect.    MAU Course  Procedures Pt has had intermittent RLQ pain - had evaluated over weekend at [facility] Pt continues to have pain- will order ultrasound US Ob Transvaginal  04/15/2015   CLINICAL DATA:  Known right-sided ectopic pregnancy. Worsening pelvic pain. Initial  encounter.  EXAM: OBSTETRIC <14 WK ULTRASOUND  TECHNIQUE: Transabdominal ultrasound was performed for evaluation of the gestation as well as the maternal uterus and adnexal regions.  COMPARISON:  Pelvic ultrasound performed 04/06/2015  FINDINGS: Intrauterine gestational sac: None seen.  Yolk sac:  N/A  Embryo:  N/A  Maternal uterus/adnexae: An echogenic mass is again noted at the right adnexa, adjacent to the uterine fundus, measuring 3.5 x 2.7 x 3.2 cm. No significant surrounding free fluid is seen.  The ovaries are unremarkable in appearance. The right ovary measures 3.0 x 2.1 x 1.5 cm, while the left  ovary measures 3.4 x 2.3 x 1.4 cm. There is no evidence for ovarian torsion.  No free fluid is seen within the pelvic cul-de-sac.  IMPRESSION: 1. Right adnexal ectopic pregnancy again noted, measuring 3.5 cm. This has increased in size from the prior study, but the interval decrease in quantitative beta HCG level is compatible with demise of the ectopic pregnancy. No evidence of acute hemorrhage at this time. 2. No intrauterine gestational sac seen.   Electronically Signed   By: Roanna Raider M.D.   On: 04/15/2015 20:53  discussed with Dr, Clearance Coots- will d/c home to return in one week for f/u HCG- return sooner if increase in pain Discussed with pt- pt uncomfortable waiting one week for repeating HCG- will bring back earlier- Wed evening or Thurs Sooner if increase in pain Pt is stable without rebound Assessment and Plan  Right ectopic pregnancy-resolving Return Thurs for repeat HCG- sooner if increase in pain Reviewed labs with pt and ultrasound report and consultations with radiologist and Dr. Clearance Coots That pt stable and with decreasing HCG- that pt can go home   [facility] 04/15/2015, 8:06 PM

## 2015-04-15 NOTE — Discharge Instructions (Signed)
Methotrexate Treatment for an Ectopic Pregnancy, Care After °Refer to this sheet in the next few weeks. These instructions provide you with information on caring for yourself after your procedure. Your health care provider may also give you more specific instructions. Your treatment has been planned according to current medical practices, but problems sometimes occur. Call your health care provider if you have any problems or questions after your procedure. °WHAT TO EXPECT AFTER THE PROCEDURE °You may have some abdominal cramping, vaginal bleeding, and fatigue in the first few days after taking methotrexate. Some other possible side effects of methotrexate include: °· Nausea. °· Vomiting. °· Diarrhea. °· Mouth sores. °· Swelling or irritation of the lining of your lungs (pneumonitis). °· Liver damage. °· Hair loss. °HOME CARE INSTRUCTIONS  °After you have received the methotrexate medicine, you need to be careful of your activities and watch your condition for several weeks. It may take 1 week before your hormone levels return to normal. °· Keep all follow-up appointments as directed by your health care provider. °· Avoid traveling too far away from your health care provider. °· Do not have sexual intercourse until your health care provider says it is safe to do so. °· You may resume your usual diet. °· Limit strenuous activity. °· Do not take folic acid, prenatal vitamins, or other vitamins that contain folic acid. °· Do not take aspirin, ibuprofen, or naproxen (nonsteroidal anti-inflammatory drugs [NSAIDs]). °· Do not drink alcohol. °SEEK MEDICAL CARE IF:  °· You cannot control your nausea and vomiting. °· You cannot control your diarrhea. °· You have sores in your mouth and want treatment. °· You need pain medicine for your abdominal pain. °· You have a rash. °· You are having a reaction to the medicine. °SEEK IMMEDIATE MEDICAL CARE IF:  °· You have increasing abdominal or pelvic pain. °· You notice increased  bleeding. °· You feel light-headed, or you faint. °· You have shortness of breath. °· Your heart rate increases. °· You have a cough. °· You have chills. °· You have a fever. °Document Released: 08/20/2011 Document Revised: 09/05/2013 Document Reviewed: 06/19/2013 °ExitCare® Patient Information ©2015 [employer]. This information is not intended to replace advice given to you by your health care provider. Make sure you discuss any questions you have with your health care provider. ° °

## 2015-04-23 ENCOUNTER — Inpatient Hospital Stay (HOSPITAL_COMMUNITY)
Admission: AD | Admit: 2015-04-23 | Discharge: 2015-04-23 | Disposition: A | Discharge status: Home / Self Care | Source: Ambulatory Visit | Attending: Obstetrics | Admitting: Obstetrics

## 2015-04-23 ENCOUNTER — Other Ambulatory Visit: Payer: Self-pay | Admitting: Obstetrics

## 2015-04-23 DIAGNOSIS — O009 Unspecified ectopic pregnancy without intrauterine pregnancy: Secondary | ICD-10-CM

## 2015-04-23 DIAGNOSIS — R51 Headache: Secondary | ICD-10-CM | POA: Diagnosis not present

## 2015-04-23 LAB — HCG, QUANTITATIVE, PREGNANCY: HCG, BETA CHAIN, QUANT, S: 42 m[IU]/mL — AB (ref <5)

## 2015-04-23 NOTE — MAU Note (Signed)
Post MTX follow up.  States got bad over weekend when down in [place], went in was checked out, last lever was in the 200's.  Feeling better. No pain now. Little spotting

## 2015-04-23 NOTE — Discharge Instructions (Signed)
Methotrexate Treatment for an Ectopic Pregnancy, Care After °Refer to this sheet in the next few weeks. These instructions provide you with information on caring for yourself after your procedure. Your health care provider may also give you more specific instructions. Your treatment has been planned according to current medical practices, but problems sometimes occur. Call your health care provider if you have any problems or questions after your procedure. °WHAT TO EXPECT AFTER THE PROCEDURE °You may have some abdominal cramping, vaginal bleeding, and fatigue in the first few days after taking methotrexate. Some other possible side effects of methotrexate include: °· Nausea. °· Vomiting. °· Diarrhea. °· Mouth sores. °· Swelling or irritation of the lining of your lungs (pneumonitis). °· Liver damage. °· Hair loss. °HOME CARE INSTRUCTIONS  °After you have received the methotrexate medicine, you need to be careful of your activities and watch your condition for several weeks. It may take 1 week before your hormone levels return to normal. °· Keep all follow-up appointments as directed by your health care provider. °· Avoid traveling too far away from your health care provider. °· Do not have sexual intercourse until your health care provider says it is safe to do so. °· You may resume your usual diet. °· Limit strenuous activity. °· Do not take folic acid, prenatal vitamins, or other vitamins that contain folic acid. °· Do not take aspirin, ibuprofen, or naproxen (nonsteroidal anti-inflammatory drugs [NSAIDs]). °· Do not drink alcohol. °SEEK MEDICAL CARE IF:  °· You cannot control your nausea and vomiting. °· You cannot control your diarrhea. °· You have sores in your mouth and want treatment. °· You need pain medicine for your abdominal pain. °· You have a rash. °· You are having a reaction to the medicine. °SEEK IMMEDIATE MEDICAL CARE IF:  °· You have increasing abdominal or pelvic pain. °· You notice increased  bleeding. °· You feel light-headed, or you faint. °· You have shortness of breath. °· Your heart rate increases. °· You have a cough. °· You have chills. °· You have a fever. °Document Released: 08/20/2011 Document Revised: 09/05/2013 Document Reviewed: 06/19/2013 °ExitCare® Patient Information ©2015 [employer]. This information is not intended to replace advice given to you by your health care provider. Make sure you discuss any questions you have with your health care provider. ° °

## 2015-04-23 NOTE — MAU Provider Note (Signed)
History     CSN: [id number]  Arrival date and time: 04/23/15 1831   None     Chief Complaint  Patient presents with  . Follow-up   HPI This is a 29 y.o. female at [redacted]w[redacted]d by LMP with a known ectopic pregnancy who is s/p 2 doses of Methotrexate, presents for followup Quant HCG level.  States pain is resolved. Has a little spotting at times. Has headaches since stopping caffeine. No visual or neurologic symptoms.   RN Note: Post MTX follow up. States got bad over weekend when down in [place], went in was checked out, last lever was in the 200's. Feeling better. No pain now. Little spotting        Last MAU visit: Pt has had 2 doses of MTX- 2nd dose given one week ago Right ectopic pregnancy-resolving Return Thurs for repeat HCG- sooner if increase in pain OB History    Gravida Para Term Preterm AB TAB SAB Ectopic Multiple Living   0 0 1      Past Medical History  Diagnosis Date  . Anemia   . HELLP syndrome   . Preterm labor   . Pregnancy induced hypertension     Past Surgical History  Procedure Laterality Date  . None    . No past surgeries    . Wisdom tooth extraction      Family History  Problem Relation Age of Onset  . Cancer Maternal Grandmother     Bladder  . Diabetes Maternal Grandmother   . Diabetes Maternal Grandfather   . Diabetes Mother     History  Substance Use Topics  . Smoking status: Never Smoker   . Smokeless tobacco: Never Used  . Alcohol Use: No    Allergies:  Allergies  Allergen Reactions  . Amoxicillin Rash and Swelling  . Flagyl [Metronidazole] Swelling and Rash  . Latex Rash  . Penicillins Swelling and Rash    Prescriptions prior to admission  Medication Sig Dispense Refill Last Dose  . acetaminophen (TYLENOL) 500 MG tablet Take 1,000 mg by mouth every 6 (six) hours as needed for mild pain.   04/15/2015 at Unknown time  . diphenhydramine-acetaminophen (TYLENOL PM) 25-500 MG TABS Take 1 tablet by mouth at  bedtime as needed (For pain and sleep.).   04/14/2015 at Unknown time  . oxyCODONE-acetaminophen (PERCOCET/ROXICET) 5-325 MG per tablet Take 1-2 tablets by mouth every 4 (four) hours as needed for severe pain. 6 tablet 0 Past Week at Unknown time  . promethazine (PHENERGAN) 25 MG tablet Take 0.5-1 tablets (12.5-25 mg total) by mouth every 6 (six) hours as needed. (Patient not taking: Reported on 04/15/2015) 30 tablet 0 Not Taking at Unknown time   Medical, Surgical, Family and Social histories reviewed and are listed above.  Medications and allergies reviewed.   Review of Systems  Constitutional: Negative for fever, chills and malaise/fatigue.  Eyes: Negative for blurred vision and double vision.  Gastrointestinal: Negative for nausea, vomiting, abdominal pain, diarrhea and constipation.  Neurological: Positive for headaches. Negative for dizziness and weakness.  Other systems negative  Physical Exam   Blood pressure 122/72, pulse 81, temperature 98.6 F (37 C), temperature source Oral, resp. rate 18, last menstrual period 02/15/2015, not currently breastfeeding.  Physical Exam  Constitutional: She is oriented to person, place, and time. She appears well-developed and well-nourished. No distress.  HENT:  Head: Normocephalic.  Cardiovascular: Normal rate.   Respiratory: Effort normal. No respiratory distress.  GI: Soft. There is no tenderness.  Genitourinary:  Exam not indicated  Musculoskeletal: Normal range of motion.  Neurological: She is alert and oriented to person, place, and time.  Skin: Skin is warm and dry.  Psychiatric: She has a normal mood and affect.    MAU Course  Procedures  MDM Consulted Dr Clearance Coots with presentation of symptoms,, and lab results. He recommends weekly quant HCG levels until they reach "0"  Results for Cheryl, [name] (MRN [id number]) as of 04/23/2015 19:37  Ref. Range 04/11/2015 11:32 04/15/2015 19:02 04/15/2015 20:41 04/23/2015 18:50  HCG, Beta Chain,  Quant, S Latest Ref Range: <5 mIU/mL 4119 (H) 2596 (H)  42 (H)   Assessment and Plan  A:  Ectopic pregnancy at [redacted]w[redacted]d        Resolving ectopic with good drop in HCG levels       Resolution of abdominal pain  P:  Discussed findings with patient.       Discharge home        Continue pelvic rest and ectopic precautions        Return here in one week for HCG level  [facility] 04/23/2015, 7:42 PM

## 2015-04-29 ENCOUNTER — Inpatient Hospital Stay (HOSPITAL_COMMUNITY)
Admission: AD | Admit: 2015-04-29 | Discharge: 2015-04-30 | Disposition: A | Discharge status: Home / Self Care | Source: Ambulatory Visit | Attending: Obstetrics | Admitting: Obstetrics

## 2015-04-29 ENCOUNTER — Encounter (HOSPITAL_COMMUNITY): Payer: Self-pay | Admitting: *Deleted

## 2015-04-29 DIAGNOSIS — Z91040 Latex allergy status: Secondary | ICD-10-CM | POA: Insufficient documentation

## 2015-04-29 DIAGNOSIS — Z8759 Personal history of other complications of pregnancy, childbirth and the puerperium: Secondary | ICD-10-CM

## 2015-04-29 DIAGNOSIS — G44209 Tension-type headache, unspecified, not intractable: Secondary | ICD-10-CM | POA: Diagnosis not present

## 2015-04-29 DIAGNOSIS — O009 Ectopic pregnancy, unspecified: Secondary | ICD-10-CM | POA: Insufficient documentation

## 2015-04-29 DIAGNOSIS — R51 Headache: Secondary | ICD-10-CM | POA: Diagnosis present

## 2015-04-29 DIAGNOSIS — Z880 Allergy status to penicillin: Secondary | ICD-10-CM | POA: Insufficient documentation

## 2015-04-29 DIAGNOSIS — Z9889 Other specified postprocedural states: Secondary | ICD-10-CM | POA: Diagnosis not present

## 2015-04-29 LAB — CBC WITH DIFFERENTIAL/PLATELET
Basophils Absolute: 0 10*3/uL (ref 0.0–0.1)
Basophils Relative: 0 % (ref 0–1)
Eosinophils Absolute: 0.1 10*3/uL (ref 0.0–0.7)
Eosinophils Relative: 2 % (ref 0–5)
HCT: 32.5 % — ABNORMAL LOW (ref 36.0–46.0)
Hemoglobin: 10.4 g/dL — ABNORMAL LOW (ref 12.0–15.0)
LYMPHS PCT: 39 % (ref 12–46)
Lymphs Abs: 2.8 10*3/uL (ref 0.7–4.0)
MCH: 27.2 pg (ref 26.0–34.0)
MCHC: 32 g/dL (ref 30.0–36.0)
MCV: 84.9 fL (ref 78.0–100.0)
MONO ABS: 0.6 10*3/uL (ref 0.1–1.0)
MONOS PCT: 8 % (ref 3–12)
NEUTROS ABS: 3.6 10*3/uL (ref 1.7–7.7)
Neutrophils Relative %: 51 % (ref 43–77)
Platelets: 193 10*3/uL (ref 150–400)
RBC: 3.83 MIL/uL — ABNORMAL LOW (ref 3.87–5.11)
RDW: 14.5 % (ref 11.5–15.5)
WBC: 7.2 10*3/uL (ref 4.0–10.5)

## 2015-04-29 NOTE — MAU Note (Signed)
Pt reports a headache x one week, states it hurts mostly on right side of head but also hurts down into her neck and shoulders. Today started seeing "floaters". S/p  Ectopic. Had MTX x 2,

## 2015-04-30 DIAGNOSIS — G44209 Tension-type headache, unspecified, not intractable: Secondary | ICD-10-CM

## 2015-04-30 LAB — HCG, QUANTITATIVE, PREGNANCY: hCG, Beta Chain, Quant, S: 6 m[IU]/mL — ABNORMAL HIGH (ref <5)

## 2015-04-30 MED ORDER — BUTALBITAL-APAP-CAFFEINE 50-325-40 MG PO TABS
2.0000 | ORAL_TABLET | Freq: Once | ORAL | Status: AC
  Administered 2015-04-30: 2 via ORAL
  Filled 2015-04-30: qty 2

## 2015-04-30 MED ORDER — CYCLOBENZAPRINE HCL 10 MG PO TABS: 5.0000 mg | ORAL_TABLET | Freq: Three times a day (TID) | ORAL | Status: DC | PRN

## 2015-04-30 MED ORDER — BUTALBITAL-APAP-CAFFEINE 50-325-40 MG PO TABS: 1.0000 | ORAL_TABLET | Freq: Four times a day (QID) | ORAL | Status: DC | PRN

## 2015-04-30 NOTE — Discharge Instructions (Signed)

## 2015-04-30 NOTE — MAU Provider Note (Signed)
Chief Complaint: No chief complaint on file.   First Provider Initiated Contact with Patient 04/30/2015 at 0045.   SUBJECTIVE HPI: Cheryl [name] is a 29 y.o. [phone] female 3 weeks S/P MTX #2 on 04/08/15 for ectopic pregnancy presents to Maternity Admissions reporting HA x 1 week and one episode of seeing floaters this afternoon. No Hx of significant HA's.   Location: Posterior neck radiating up back of head around to bilat temples Quality: Pressure, squeezing Severity: 7/10 on pain scale Duration: 1 week Context: None Timing: constant Modifying factors: Worse w/ mvmt, turning neck side to side. No pain flexing or extending neck. Temporary improvement q/ warm compresses. No improvement w/ Tylenol.  Signs and symptoms: One episode of seeing something float past her field of vision this afternoon.   Past Medical History  Diagnosis Date  . Anemia   . HELLP syndrome   . Preterm labor   . Pregnancy induced hypertension    OB History  Gravida Para Term Preterm AB SAB TAB Ectopic Multiple Living  5 2 1 1 2 1 1  0 0 1    # Outcome Date GA Lbr Len/2nd Weight Sex Delivery Anes PTL Lv  5 Term 11/18/13 [redacted]w[redacted]d 02:41 / 00:26 5 lb 6.4 oz (2.45 kg) F Vag-Spont None  Y  4 SAB 10/15/12 [redacted]w[redacted]d         3 Preterm 08/01/07 [redacted]w[redacted]d    Vag-Spont   FD     Comments: HELP Syndrome  2 Gravida           1 TAB              Past Surgical History  Procedure Laterality Date  . None    . No past surgeries    . Wisdom tooth extraction     Social History   Social History  . Marital Status: Married    Spouse Name: N/A  . Number of Children: N/A  . Years of Education: N/A   Occupational History  . Armed Officer/Security   . Student    Social History Main Topics  . Smoking status: Never Smoker   . Smokeless tobacco: Never Used  . Alcohol Use: No  . Drug Use: No  . Sexual Activity:    Partners: Male    Copy: , None   Other Topics Concern  . Not on file   Social History  Narrative   Lives at home with husband.     No current facility-administered medications on file prior to encounter.   Current Outpatient Prescriptions on File Prior to Encounter  Medication Sig Dispense Refill  . acetaminophen (TYLENOL) 500 MG tablet Take 1,000 mg by mouth every 6 (six) hours as needed for mild pain.    . diphenhydramine-acetaminophen (TYLENOL PM) 25-500 MG TABS Take 1 tablet by mouth at bedtime as needed (For pain and sleep.).    Marland Kitchen oxyCODONE-acetaminophen (PERCOCET/ROXICET) 5-325 MG per tablet Take 1-2 tablets by mouth every 4 (four) hours as needed for severe pain. 6 tablet 0  . promethazine (PHENERGAN) 25 MG tablet Take 0.5-1 tablets (12.5-25 mg total) by mouth every 6 (six) hours as needed. (Patient not taking: Reported on 04/15/2015) 30 tablet 0   Allergies  Allergen Reactions  . Amoxicillin Rash and Swelling  . Flagyl [Metronidazole] Swelling and Rash  . Latex Rash  . Penicillins Swelling and Rash    I have reviewed the past Medical Hx, Surgical Hx, Social Hx, Allergies and Medications.   Review of Systems  Constitutional: Negative for fever and chills.  HENT: Negative for congestion, rhinorrhea, sinus pressure and sore throat.   Eyes: Negative for photophobia.  Musculoskeletal: Positive for neck pain and neck stiffness.  Neurological: Positive for headaches. Negative for dizziness, syncope, facial asymmetry, speech difficulty, weakness, light-headedness and numbness.  Psychiatric/Behavioral: Negative for confusion.   OBJECTIVE Patient Vitals for the past 24 hrs:  BP Temp Temp src Pulse Resp SpO2 Height Weight  04/30/15 0121 133/84 mmHg - - (!) 58 16 - - -  04/29/15 2313 141/76 mmHg 99.2 F (37.3 C) Oral (!) 54 16 100 %  (1.727 m) 224 lb (101.606 kg)   Constitutional: Well-developed, well-nourished female in mild distress.  Head: Normo-cephalic. Sternocleidomastoid muscles tight and TTP bilat. No nuchal rigidity. sinuses NT.  Eyes: Vision grossly  normal.  Cardiovascular: Mildly bradycardic Respiratory: normal rate and effort.  MS: Extremities nontender, no edema, normal ROM Neurologic: Alert and oriented x 4. Grossly normal sensation of upper and low extremities bilat. Symmetric facial mvmts. Normal speech and gait.    LAB RESULTS Results for orders placed or performed during the hospital encounter of 04/29/15 (from the past 24 hour(s))  hCG, quantitative, pregnancy     Status: Abnormal   Collection Time: 04/29/15 11:37 PM  Result Value Ref Range   hCG, Beta Chain, Quant, S 6 (H) <5 mIU/mL  CBC with Differential/Platelet     Status: Abnormal   Collection Time: 04/29/15 11:37 PM  Result Value Ref Range   WBC 7.2 4.0 - 10.5 K/uL   RBC 3.83 (L) 3.87 - 5.11 MIL/uL   Hemoglobin 10.4 (L) 12.0 - 15.0 g/dL   HCT 60.4 (L) 54.0 - 98.1 %   MCV 84.9 78.0 - 100.0 fL   MCH 27.2 26.0 - 34.0 pg   MCHC 32.0 30.0 - 36.0 g/dL   RDW 19.1 47.8 - 29.5 %   Platelets 193 150 - 400 K/uL   Neutrophils Relative % 51 43 - 77 %   Neutro Abs 3.6 1.7 - 7.7 K/uL   Lymphocytes Relative 39 12 - 46 %   Lymphs Abs 2.8 0.7 - 4.0 K/uL   Monocytes Relative 8 3 - 12 %   Monocytes Absolute 0.6 0.1 - 1.0 K/uL   Eosinophils Relative 2 0 - 5 %   Eosinophils Absolute 0.1 0.0 - 0.7 K/uL   Basophils Relative 0 0 - 1 %   Basophils Absolute 0.0 0.0 - 0.1 K/uL    IMAGING NA  MAU COURSE CBC, Quant (as routine F/U after MTX), Fioricet.   HA resolved. States she is feeling much better.   MDM -29 year-old female 3 weeks S/P MTX for ectopic pregnancy presents w/ severe HA that resolved w/ Fioricet. Although pt reports neck stiffness, it is muscular tension and not nuchal rigidity. No neuro deficits, fever or leukocytosis to suggest emergent condition. C/W tension HA. No apparent relationship to ectopic pregnancy or Tx.   -Appropriately dropping quant after MTX. Follow in clinic weekly until >1.  ASSESSMENT 1. Tension headache    PLAN Discharge home in stable  condition. HA red flags reviewed. Warm compresses, massage and gentle stretches recommended.   Follow-up Information    Follow up with [facility] In 1 week.   Specialty:  Obstetrics and Gynecology   Why:  For repeat blood work   Contact information:   [address] [place] [address] [phone]      Follow up with [facility] ED.   Why:  As needed in emergencies   Contact information:   [address] [place] [address]        Medication List    STOP taking these medications        acetaminophen 500 MG tablet  Commonly known as:  TYLENOL     acetaminophen-codeine 300-30 MG per tablet  Commonly known as:  TYLENOL #3     oxyCODONE-acetaminophen 5-325 MG per tablet  Commonly known as:  PERCOCET/ROXICET      TAKE these medications        butalbital-acetaminophen-caffeine 50-325-40 MG per tablet  Commonly known as:  FIORICET  Take 1-2 tablets by mouth every 6 (six) hours as needed for headache.     cyclobenzaprine 10 MG tablet  Commonly known as:  FLEXERIL  Take 0.5-1 tablets (5-10 mg total) by mouth 3 (three) times daily as needed for muscle spasms.     diphenhydramine-acetaminophen 25-500 MG Tabs  Commonly known as:  TYLENOL PM  Take 1 tablet by mouth at bedtime as needed (For pain and sleep.).     promethazine 25 MG tablet  Commonly known as:  PHENERGAN  Take 0.5-1 tablets (12.5-25 mg total) by mouth every 6 (six) hours as needed.       [place], CNM 04/30/2015  1:44 AM

## 2015-05-01 ENCOUNTER — Encounter: Admitting: Certified Nurse Midwife

## 2015-05-03 ENCOUNTER — Ambulatory Visit (HOSPITAL_COMMUNITY)

## 2015-05-09 ENCOUNTER — Inpatient Hospital Stay (HOSPITAL_COMMUNITY)
Admission: AD | Admit: 2015-05-09 | Discharge: 2015-05-09 | Disposition: A | Discharge status: Home / Self Care | Source: Ambulatory Visit | Attending: Family Medicine | Admitting: Family Medicine

## 2015-05-09 DIAGNOSIS — O001 Tubal pregnancy: Secondary | ICD-10-CM | POA: Insufficient documentation

## 2015-05-09 DIAGNOSIS — Z9221 Personal history of antineoplastic chemotherapy: Secondary | ICD-10-CM

## 2015-05-09 DIAGNOSIS — O00109 Unspecified tubal pregnancy without intrauterine pregnancy: Secondary | ICD-10-CM

## 2015-05-09 LAB — HCG, QUANTITATIVE, PREGNANCY: hCG, Beta Chain, Quant, S: 1 m[IU]/mL (ref <5)

## 2015-05-09 NOTE — MAU Provider Note (Signed)
History     CSN: [id number]  Arrival date and time: 05/09/15 1949   None     No chief complaint on file.  HPI Comments: Cheryl [name] is a 29 y.o. [phone] at Unknown who presents today for FU HCG for a known ectopic pregnancy. She has had two doses of MTX, and was to FU in the clinic. She has not followed up there, and presents today for FU HCG. She denies any abdominal pain or vaginal bleeding.    Past Medical History  Diagnosis Date  . Anemia   . HELLP syndrome   . Preterm labor   . Pregnancy induced hypertension     Past Surgical History  Procedure Laterality Date  . None    . No past surgeries    . Wisdom tooth extraction      Family History  Problem Relation Age of Onset  . Cancer Maternal Grandmother     Bladder  . Diabetes Maternal Grandmother   . Diabetes Maternal Grandfather   . Diabetes Mother     Social History  Substance Use Topics  . Smoking status: Never Smoker   . Smokeless tobacco: Never Used  . Alcohol Use: No    Allergies:  Allergies  Allergen Reactions  . Amoxicillin Rash and Swelling  . Flagyl [Metronidazole] Swelling and Rash  . Latex Rash  . Penicillins Swelling and Rash    Prescriptions prior to admission  Medication Sig Dispense Refill Last Dose  . butalbital-acetaminophen-caffeine (FIORICET) 50-325-40 MG per tablet Take 1-2 tablets by mouth every 6 (six) hours as needed for headache. 20 tablet 1   . cyclobenzaprine (FLEXERIL) 10 MG tablet Take 0.5-1 tablets (5-10 mg total) by mouth 3 (three) times daily as needed for muscle spasms. 30 tablet 0   . diphenhydramine-acetaminophen (TYLENOL PM) 25-500 MG TABS Take 1 tablet by mouth at bedtime as needed (For pain and sleep.).   Past Week at Unknown time  . promethazine (PHENERGAN) 25 MG tablet Take 0.5-1 tablets (12.5-25 mg total) by mouth every 6 (six) hours as needed. (Patient not taking: Reported on 04/15/2015) 30 tablet 0 Not Taking at Unknown time    Review of Systems   Constitutional: Negative for fever.  Gastrointestinal: Negative for nausea, vomiting and abdominal pain.   Physical Exam   Blood pressure 140/88, pulse 84, temperature 98.5 F (36.9 C), temperature source Oral, resp. rate 15, SpO2 100 %, unknown if currently breastfeeding.  Physical Exam  Nursing note and vitals reviewed. Constitutional: She is oriented to person, place, and time. She appears well-developed and well-nourished. No distress.  HENT:  Head: Normocephalic.  Cardiovascular: Normal rate.   Respiratory: Effort normal.  GI: Soft. There is no tenderness.  Neurological: She is alert and oriented to person, place, and time.  Skin: Skin is warm and dry.  Psychiatric: She has a normal mood and affect.     Results for orders placed or performed during the hospital encounter of 05/09/15 (from the past 24 hour(s))  hCG, quantitative, pregnancy     Status: None   Collection Time: 05/09/15  7:59 PM  Result Value Ref Range   hCG, Beta Chain, Quant, S 1 <5 mIU/mL    MAU Course  Procedures  MDM   Assessment and Plan   1. Ectopic pregnancy, tubal   2. H/O methotrexate therapy    DC home RX: none  Return to MAU as needed FU with OB as planned  Follow-up Information    Follow up with  [facility] [place].   Why:  As needed   Contact information:   [address] [facility] [address] [phone]      Tawnya Crook 05/09/2015, 8:55 PM

## 2015-05-09 NOTE — Discharge Instructions (Signed)
Contraception Choices Contraception (birth control) is the use of any methods or devices to prevent pregnancy. Below are some methods to help avoid pregnancy. HORMONAL METHODS   Contraceptive implant. This is a thin, plastic tube containing progesterone hormone. It does not contain estrogen hormone. Your health care provider inserts the tube in the inner part of the upper arm. The tube can remain in place for up to 3 years. After 3 years, the implant must be removed. The implant prevents the ovaries from releasing an egg (ovulation), thickens the cervical mucus to prevent sperm from entering the uterus, and thins the lining of the inside of the uterus.  Progesterone-only injections. These injections are given every 3 months by your health care provider to prevent pregnancy. This synthetic progesterone hormone stops the ovaries from releasing eggs. It also thickens cervical mucus and changes the uterine lining. This makes it harder for sperm to survive in the uterus.  Birth control pills. These pills contain estrogen and progesterone hormone. They work by preventing the ovaries from releasing eggs (ovulation). They also cause the cervical mucus to thicken, preventing the sperm from entering the uterus. Birth control pills are prescribed by a health care provider.Birth control pills can also be used to treat heavy periods.  Minipill. This type of birth control pill contains only the progesterone hormone. They are taken every day of each month and must be prescribed by your health care provider.  Birth control patch. The patch contains hormones similar to those in birth control pills. It must be changed once a week and is prescribed by a health care provider.  Vaginal ring. The ring contains hormones similar to those in birth control pills. It is left in the vagina for 3 weeks, removed for 1 week, and then a new one is put back in place. The patient must be comfortable inserting and removing the ring  from the vagina.A health care provider's prescription is necessary.  Emergency contraception. Emergency contraceptives prevent pregnancy after unprotected sexual intercourse. This pill can be taken right after sex or up to 5 days after unprotected sex. It is most effective the sooner you take the pills after having sexual intercourse. Most emergency contraceptive pills are available without a prescription. Check with your pharmacist. Do not use emergency contraception as your only form of birth control. BARRIER METHODS   Female condom. This is a thin sheath (latex or rubber) that is worn over the penis during sexual intercourse. It can be used with spermicide to increase effectiveness.  Female condom. This is a soft, loose-fitting sheath that is put into the vagina before sexual intercourse.  Diaphragm. This is a soft, latex, dome-shaped barrier that must be fitted by a health care provider. It is inserted into the vagina, along with a spermicidal jelly. It is inserted before intercourse. The diaphragm should be left in the vagina for 6 to 8 hours after intercourse.  Cervical cap. This is a round, soft, latex or plastic cup that fits over the cervix and must be fitted by a health care provider. The cap can be left in place for up to 48 hours after intercourse.  Sponge. This is a soft, circular piece of polyurethane foam. The sponge has spermicide in it. It is inserted into the vagina after wetting it and before sexual intercourse.  Spermicides. These are chemicals that kill or block sperm from entering the cervix and uterus. They come in the form of creams, jellies, suppositories, foam, or tablets. They do not require a   prescription. They are inserted into the vagina with an applicator before having sexual intercourse. The process must be repeated every time you have sexual intercourse. INTRAUTERINE CONTRACEPTION  Intrauterine device (IUD). This is a T-shaped device that is put in a woman's uterus  during a menstrual period to prevent pregnancy. There are 2 types:  Copper IUD. This type of IUD is wrapped in copper wire and is placed inside the uterus. Copper makes the uterus and fallopian tubes produce a fluid that kills sperm. It can stay in place for 10 years.  Hormone IUD. This type of IUD contains the hormone progestin (synthetic progesterone). The hormone thickens the cervical mucus and prevents sperm from entering the uterus, and it also thins the uterine lining to prevent implantation of a fertilized egg. The hormone can weaken or kill the sperm that get into the uterus. It can stay in place for 3-5 years, depending on which type of IUD is used. PERMANENT METHODS OF CONTRACEPTION  Female tubal ligation. This is when the woman's fallopian tubes are surgically sealed, tied, or blocked to prevent the egg from traveling to the uterus.  Hysteroscopic sterilization. This involves placing a small coil or insert into each fallopian tube. Your doctor uses a technique called hysteroscopy to do the procedure. The device causes scar tissue to form. This results in permanent blockage of the fallopian tubes, so the sperm cannot fertilize the egg. It takes about 3 months after the procedure for the tubes to become blocked. You must use another form of birth control for these 3 months.  Female sterilization. This is when the female has the tubes that carry sperm tied off (vasectomy).This blocks sperm from entering the vagina during sexual intercourse. After the procedure, the man can still ejaculate fluid (semen). NATURAL PLANNING METHODS  Natural family planning. This is not having sexual intercourse or using a barrier method (condom, diaphragm, cervical cap) on days the woman could become pregnant.  Calendar method. This is keeping track of the length of each menstrual cycle and identifying when you are fertile.  Ovulation method. This is avoiding sexual intercourse during ovulation.  Symptothermal  method. This is avoiding sexual intercourse during ovulation, using a thermometer and ovulation symptoms.  Post-ovulation method. This is timing sexual intercourse after you have ovulated. Regardless of which type or method of contraception you choose, it is important that you use condoms to protect against the transmission of sexually transmitted infections (STIs). Talk with your health care provider about which form of contraception is most appropriate for you. Document Released: 08/31/2005 Document Revised: 09/05/2013 Document Reviewed: 02/23/2013 [employer] Patient Information 2015 [employer]. This information is not intended to replace advice given to you by your health care provider. Make sure you discuss any questions you have with your health care provider.  

## 2015-05-09 NOTE — MAU Note (Signed)
Pt presents for repeat BHCG, denies bleeding or pain.  

## 2015-10-31 ENCOUNTER — Telehealth: Payer: Self-pay | Admitting: *Deleted

## 2015-10-31 NOTE — Telephone Encounter (Signed)
Pt called to office asking for return call. Return call to pt. Pt is inquiring how to get her medical records in order to send to new OB/GYN office. Pt was made aware that she may come by office and sign a release of information form and her records can be sent directly to office of her choice.  Pt advised to bring office information in order for [place] to send records. Pt made aware that it may take a few days to prepare records to be sent. Pt states understanding.

## 2015-11-06 ENCOUNTER — Telehealth: Payer: Self-pay

## 2015-11-06 NOTE — Telephone Encounter (Signed)
sent patient's medical records to [facility] ob[facility]gyn through [facility], but had to mail the [facility] records as would not go through fax - mailed 11/07/15

## 2015-11-07 ENCOUNTER — Encounter (HOSPITAL_COMMUNITY): Payer: Self-pay | Admitting: *Deleted

## 2015-11-07 ENCOUNTER — Inpatient Hospital Stay (HOSPITAL_COMMUNITY)
Admission: AD | Admit: 2015-11-07 | Discharge: 2015-11-07 | Disposition: A | Discharge status: Home / Self Care | Source: Ambulatory Visit | Attending: Obstetrics & Gynecology | Admitting: Obstetrics & Gynecology

## 2015-11-07 ENCOUNTER — Ambulatory Visit

## 2015-11-07 ENCOUNTER — Inpatient Hospital Stay (HOSPITAL_COMMUNITY)

## 2015-11-07 DIAGNOSIS — Z3A01 Less than 8 weeks gestation of pregnancy: Secondary | ICD-10-CM | POA: Diagnosis not present

## 2015-11-07 DIAGNOSIS — N939 Abnormal uterine and vaginal bleeding, unspecified: Secondary | ICD-10-CM | POA: Diagnosis present

## 2015-11-07 DIAGNOSIS — O209 Hemorrhage in early pregnancy, unspecified: Secondary | ICD-10-CM

## 2015-11-07 DIAGNOSIS — Z3491 Encounter for supervision of normal pregnancy, unspecified, first trimester: Secondary | ICD-10-CM | POA: Insufficient documentation

## 2015-11-07 LAB — CBC WITH DIFFERENTIAL/PLATELET
Basophils Absolute: 0 10*3/uL (ref 0.0–0.1)
Basophils Relative: 0 %
Eosinophils Absolute: 0 10*3/uL (ref 0.0–0.7)
Eosinophils Relative: 1 %
HEMATOCRIT: 40.1 % (ref 36.0–46.0)
HEMOGLOBIN: 13.3 g/dL (ref 12.0–15.0)
LYMPHS ABS: 1.3 10*3/uL (ref 0.7–4.0)
LYMPHS PCT: 27 %
MCH: 28.1 pg (ref 26.0–34.0)
MCHC: 33.2 g/dL (ref 30.0–36.0)
MCV: 84.8 fL (ref 78.0–100.0)
MONOS PCT: 4 %
Monocytes Absolute: 0.2 10*3/uL (ref 0.1–1.0)
NEUTROS ABS: 3.3 10*3/uL (ref 1.7–7.7)
NEUTROS PCT: 68 %
Platelets: 126 10*3/uL — ABNORMAL LOW (ref 150–400)
RBC: 4.73 MIL/uL (ref 3.87–5.11)
RDW: 14 % (ref 11.5–15.5)
WBC: 4.8 10*3/uL (ref 4.0–10.5)

## 2015-11-07 LAB — WET PREP, GENITAL
CLUE CELLS WET PREP: NONE SEEN
SPERM: NONE SEEN
Trich, Wet Prep: NONE SEEN
YEAST WET PREP: NONE SEEN

## 2015-11-07 LAB — URINALYSIS, ROUTINE W REFLEX MICROSCOPIC
Bilirubin Urine: NEGATIVE
GLUCOSE, UA: NEGATIVE mg/dL
KETONES UR: NEGATIVE mg/dL
Leukocytes, UA: NEGATIVE
Nitrite: NEGATIVE
PH: 6.5 (ref 5.0–8.0)
Protein, ur: NEGATIVE mg/dL
Specific Gravity, Urine: 1.015 (ref 1.005–1.030)

## 2015-11-07 LAB — URINE MICROSCOPIC-ADD ON

## 2015-11-07 LAB — HCG, QUANTITATIVE, PREGNANCY: hCG, Beta Chain, Quant, S: 2291 m[IU]/mL — ABNORMAL HIGH (ref <5)

## 2015-11-07 LAB — POCT PREGNANCY, URINE: Preg Test, Ur: POSITIVE — AB

## 2015-11-07 NOTE — MAU Note (Signed)
Saw a couple little clots when she wiped this morning, brown spotting this morning.  preg confirmed at primary yesterday, +HPT couple wks ago.  Has had some mild cramping

## 2015-11-07 NOTE — Discharge Instructions (Signed)
Pelvic Rest °Pelvic rest is sometimes recommended for women when:  °· The placenta is partially or completely covering the opening of the cervix (placenta previa). °· There is bleeding between the uterine wall and the amniotic sac in the first trimester (subchorionic hemorrhage). °· The cervix begins to open without labor starting (incompetent cervix, cervical insufficiency). °· The labor is too early (preterm labor). °HOME CARE INSTRUCTIONS °· Do not have sexual intercourse, stimulation, or an orgasm. °· Do not use tampons, douche, or put anything in the vagina. °· Do not lift anything over 10 pounds (4.5 kg). °· Avoid strenuous activity or straining your pelvic muscles. °SEEK MEDICAL CARE IF:  °· You have any vaginal bleeding during pregnancy. Treat this as a potential emergency. °· You have cramping pain felt low in the stomach (stronger than menstrual cramps). °· You notice vaginal discharge (watery, mucus, or bloody). °· You have a low, dull backache. °· There are regular contractions or uterine tightening. °SEEK IMMEDIATE MEDICAL CARE IF: °You have vaginal bleeding and have placenta previa.  °  °This information is not intended to replace advice given to you by your health care provider. Make sure you discuss any questions you have with your health care provider. °  °Document Released: 12/26/2010 Document Revised: 11/23/2011 Document Reviewed: 03/04/2015 °Elsevier Interactive Patient Education ©2016 [employer] ° °Vaginal Bleeding During Pregnancy, First Trimester °A small amount of bleeding (spotting) from the vagina is common in early pregnancy. Sometimes the bleeding is normal and is not a problem, and sometimes it is a sign of something serious. Be sure to tell your doctor about any bleeding from your vagina right away. °HOME CARE °· Watch your condition for any changes. °· Follow your doctor's instructions about how active you can be. °· If you are on bed rest: °¨ You may need to stay in bed and only  get up to use the bathroom. °¨ You may be allowed to do some activities. °¨ If you need help, make plans for someone to help you. °· Write down: °¨ The number of pads you use each day. °¨ How often you change pads. °¨ How soaked (saturated) your pads are. °· Do not use tampons. °· Do not douche. °· Do not have sex or orgasms until your doctor says it is okay. °· If you pass any tissue from your vagina, save the tissue so you can show it to your doctor. °· Only take medicines as told by your doctor. °· Do not take aspirin because it can make you bleed. °· Keep all follow-up visits as told by your doctor. °GET HELP IF:  °· You bleed from your vagina. °· You have cramps. °· You have labor pains. °· You have a fever that does not go away after you take medicine. °GET HELP RIGHT AWAY IF:  °· You have very bad cramps in your back or belly (abdomen). °· You pass large clots or tissue from your vagina. °· You bleed more. °· You feel light-headed or weak. °· You pass out (faint). °· You have chills. °· You are leaking fluid or have a gush of fluid from your vagina. °· You pass out while pooping (having a bowel movement). °MAKE SURE YOU: °· Understand these instructions. °· Will watch your condition. °· Will get help right away if you are not doing well or get worse. °  °This information is not intended to replace advice given to you by your health care provider. Make sure you discuss any questions   you have with your health care provider. °  °Document Released: 01/15/2014 Document Reviewed: 01/15/2014 °Elsevier Interactive Patient Education ©2016 Elsevier Inc. ° °

## 2015-11-07 NOTE — MAU Provider Note (Signed)
History     CSN: [id number]  Arrival date and time: 11/07/15 4098   First Provider Initiated Contact with Patient 11/07/15 1051      Chief Complaint  Patient presents with  . Abdominal Pain  . Vaginal Bleeding   HPI [name] is a 30 y.o. [id number] at [redacted]w[redacted]d who presents to MAU today with complaint of vaginal bleeding. The patient states that bleeding started last night. It was first only noted with wiping. She states this morning she noted a few small brown clots. She had intercourse last night. She states a very mild associated lower abdominal cramping. She denies need for pain medication. She also denies UTI symptoms, vaginal discharge fever, N/V/D today. She has a history of ectopic pregnancy treated with MTX.   OB History    Gravida Para Term Preterm AB TAB SAB Ectopic Multiple Living   6 2 1 1 2 1 1  0 0 1      Past Medical History  Diagnosis Date  . Anemia   . HELLP syndrome   . Preterm labor   . Pregnancy induced hypertension     Past Surgical History  Procedure Laterality Date  . None    . No past surgeries    . Wisdom tooth extraction      Family History  Problem Relation Age of Onset  . Cancer Maternal Grandmother     Bladder  . Diabetes Maternal Grandmother   . Diabetes Maternal Grandfather   . Diabetes Mother     Social History  Substance Use Topics  . Smoking status: Never Smoker   . Smokeless tobacco: Never Used  . Alcohol Use: No    Allergies:  Allergies  Allergen Reactions  . Amoxicillin Rash and Swelling  . Flagyl [Metronidazole] Swelling and Rash  . Latex Rash  . Penicillins Swelling and Rash    Has patient had a PCN reaction causing immediate rash, facial/tongue/throat swelling, SOB or lightheadedness with hypotension: Yes Has patient had a PCN reaction causing severe rash involving mucus membranes or skin necrosis: Yes Has patient had a PCN reaction that required hospitalization No Has patient had a PCN reaction occurring  within the last 10 years: No If all of the above answers are "NO", then may proceed with Cephalosporin use.     Prescriptions prior to admission  Medication Sig Dispense Refill Last Dose  . Prenatal Vit-Fe Fumarate-FA (PRENATAL MULTIVITAMIN) TABS tablet Take 1 tablet by mouth daily at 12 noon.   11/07/2015 at Unknown time  . Probiotic Product (PROBIOTIC DAILY) CAPS Take 1 capsule by mouth daily.   11/07/2015 at Unknown time  . [DISCONTINUED] butalbital-acetaminophen-caffeine (FIORICET) 50-325-40 MG per tablet Take 1-2 tablets by mouth every 6 (six) hours as needed for headache. (Patient not taking: Reported on 11/07/2015) 20 tablet 1   . [DISCONTINUED] cyclobenzaprine (FLEXERIL) 10 MG tablet Take 0.5-1 tablets (5-10 mg total) by mouth 3 (three) times daily as needed for muscle spasms. (Patient not taking: Reported on 11/07/2015) 30 tablet 0   . [DISCONTINUED] promethazine (PHENERGAN) 25 MG tablet Take 0.5-1 tablets (12.5-25 mg total) by mouth every 6 (six) hours as needed. (Patient not taking: Reported on 04/15/2015) 30 tablet 0 Not Taking at Unknown time    Review of Systems  Constitutional: Negative for fever and malaise/fatigue.  Gastrointestinal: Positive for abdominal pain. Negative for nausea, vomiting, diarrhea and constipation.  Genitourinary: Negative for dysuria, urgency and frequency.       + vaginal bleeding   Physical  Exam   Blood pressure 133/72, pulse 77, temperature 98.8 F (37.1 C), temperature source Oral, resp. rate 18, height  (1.727 m), weight 219 lb (99.338 kg), last menstrual period 10/04/2015, unknown if currently breastfeeding.  Physical Exam  Nursing note and vitals reviewed. Constitutional: She is oriented to person, place, and time. She appears well-developed and well-nourished. No distress.  HENT:  Head: Normocephalic and atraumatic.  Cardiovascular: Normal rate.   Respiratory: Effort normal.  GI: Soft. She exhibits no distension and no mass. There is no  tenderness. There is no rebound and no guarding.  Genitourinary: Uterus is not enlarged and not tender. Cervix exhibits no motion tenderness, no discharge and no friability. Right adnexum displays no mass and no tenderness. Left adnexum displays no mass and no tenderness. There is bleeding (scant) in the vagina. Vaginal discharge (scant mucus) found.  Neurological: She is alert and oriented to person, place, and time.  Skin: Skin is warm and dry. No erythema.  Psychiatric: She has a normal mood and affect.   Results for orders placed or performed during the hospital encounter of 11/07/15 (from the past 24 hour(s))  Urinalysis, Routine w reflex microscopic (not at [facility])     Status: Abnormal   Collection Time: 11/07/15 10:30 AM  Result Value Ref Range   Color, Urine YELLOW YELLOW   APPearance CLEAR CLEAR   Specific Gravity, Urine 1.015 1.005 - 1.030   pH 6.5 5.0 - 8.0   Glucose, UA NEGATIVE NEGATIVE mg/dL   Hgb urine dipstick SMALL (A) NEGATIVE   Bilirubin Urine NEGATIVE NEGATIVE   Ketones, ur NEGATIVE NEGATIVE mg/dL   Protein, ur NEGATIVE NEGATIVE mg/dL   Nitrite NEGATIVE NEGATIVE   Leukocytes, UA NEGATIVE NEGATIVE  Urine microscopic-add on     Status: Abnormal   Collection Time: 11/07/15 10:30 AM  Result Value Ref Range   Squamous Epithelial / LPF 0-5 (A) NONE SEEN   WBC, UA 0-5 0 - 5 WBC/hpf   RBC / HPF 0-5 0 - 5 RBC/hpf   Bacteria, UA RARE (A) NONE SEEN  Pregnancy, urine POC     Status: Abnormal   Collection Time: 11/07/15 10:31 AM  Result Value Ref Range   Preg Test, Ur POSITIVE (A) NEGATIVE  Wet prep, genital     Status: Abnormal   Collection Time: 11/07/15 10:55 AM  Result Value Ref Range   Yeast Wet Prep HPF POC NONE SEEN NONE SEEN   Trich, Wet Prep NONE SEEN NONE SEEN   Clue Cells Wet Prep HPF POC NONE SEEN NONE SEEN   WBC, Wet Prep HPF POC FEW (A) NONE SEEN   Sperm NONE SEEN   hCG, quantitative, pregnancy     Status: Abnormal   Collection Time: 11/07/15 11:03 AM   Result Value Ref Range   hCG, Beta Chain, Quant, S 2291 (H) <5 mIU/mL  CBC with Differential/Platelet     Status: Abnormal   Collection Time: 11/07/15 11:04 AM  Result Value Ref Range   WBC 4.8 4.0 - 10.5 K/uL   RBC 4.73 3.87 - 5.11 MIL/uL   Hemoglobin 13.3 12.0 - 15.0 g/dL   HCT 16.1 09.6 - 04.5 %   MCV 84.8 78.0 - 100.0 fL   MCH 28.1 26.0 - 34.0 pg   MCHC 33.2 30.0 - 36.0 g/dL   RDW 40.9 81.1 - 91.4 %   Platelets 126 (L) 150 - 400 K/uL   Neutrophils Relative % 68 %   Neutro Abs 3.3 1.7 - 7.7  K/uL   Lymphocytes Relative 27 %   Lymphs Abs 1.3 0.7 - 4.0 K/uL   Monocytes Relative 4 %   Monocytes Absolute 0.2 0.1 - 1.0 K/uL   Eosinophils Relative 1 %   Eosinophils Absolute 0.0 0.0 - 0.7 K/uL   Basophils Relative 0 %   Basophils Absolute 0.0 0.0 - 0.1 K/uL   US Ob Comp Less 14 Wks  11/07/2015  CLINICAL DATA:  Pregnant, vaginal bleeding EXAM: OBSTETRIC <14 WK [place] AND TRANSVAGINAL OB US TECHNIQUE: Both transabdominal and transvaginal ultrasound examinations were performed for complete evaluation of the gestation as well as the maternal uterus, adnexal regions, and pelvic cul-de-sac. Transvaginal technique was performed to assess early pregnancy. COMPARISON:  None. FINDINGS: Intrauterine gestational sac: Visualized/normal in shape. Yolk sac:  Not visualized Embryo:  Not visualized MSD: 4.6  mm   5 w   2  d Subchorionic hemorrhage:  None visualized. Maternal uterus/adnexae: Bilateral ovaries are within normal limits, noting a left corpus luteal cyst. No free fluid. IMPRESSION: Single intrauterine gestational sac, measuring 5 weeks 2 days by mean sac diameter. No yolk sac or fetal pole is visualized. Consider follow-up pelvic ultrasound in 14 days to confirm viability as clinically warranted. Electronically Signed   By: Charline Bills M.D.   On: 11/07/2015 12:03   US Ob Transvaginal  11/07/2015  CLINICAL DATA:  Pregnant, vaginal bleeding EXAM: OBSTETRIC <14 WK [place] AND TRANSVAGINAL OB US  TECHNIQUE: Both transabdominal and transvaginal ultrasound examinations were performed for complete evaluation of the gestation as well as the maternal uterus, adnexal regions, and pelvic cul-de-sac. Transvaginal technique was performed to assess early pregnancy. COMPARISON:  None. FINDINGS: Intrauterine gestational sac: Visualized/normal in shape. Yolk sac:  Not visualized Embryo:  Not visualized MSD: 4.6  mm   5 w   2  d Subchorionic hemorrhage:  None visualized. Maternal uterus/adnexae: Bilateral ovaries are within normal limits, noting a left corpus luteal cyst. No free fluid. IMPRESSION: Single intrauterine gestational sac, measuring 5 weeks 2 days by mean sac diameter. No yolk sac or fetal pole is visualized. Consider follow-up pelvic ultrasound in 14 days to confirm viability as clinically warranted. Electronically Signed   By: Charline Bills M.D.   On: 11/07/2015 12:03    MAU Course  Procedures None  MDM +UPT UA, wet prep, GC/chlamydia, CBC, quant hCG, HIV, RPR and [place] today to rule out ectopic pregnancy O+ blood type in Epic from previous visit  Assessment and Plan  A: IUGS of [redacted]w[redacted]d without YS or FP Pregnancy of unknown location  P: Discharge home Ectopic/bleeding precautions discussed Pelvic rest advised Patient advised to follow-up in MAU in 48 hours (Saturday) for repeat quant hCG or sooner if her condition were to change or worsen   Marny Lowenstein, PA-C  11/07/2015, 12:26 PM

## 2015-11-08 LAB — GC/CHLAMYDIA PROBE AMP (~~LOC~~) NOT AT ARMC
Chlamydia: NEGATIVE
Neisseria Gonorrhea: NEGATIVE

## 2015-11-08 LAB — RPR: RPR Ser Ql: NONREACTIVE

## 2015-11-08 LAB — HIV ANTIBODY (ROUTINE TESTING W REFLEX): HIV Screen 4th Generation wRfx: NONREACTIVE

## 2015-11-11 ENCOUNTER — Telehealth (HOSPITAL_COMMUNITY): Payer: Self-pay

## 2015-11-12 ENCOUNTER — Encounter: Payer: Self-pay | Admitting: *Deleted

## 2015-11-12 DIAGNOSIS — O09219 Supervision of pregnancy with history of pre-term labor, unspecified trimester: Secondary | ICD-10-CM

## 2015-11-12 DIAGNOSIS — O09899 Supervision of other high risk pregnancies, unspecified trimester: Secondary | ICD-10-CM

## 2015-11-12 DIAGNOSIS — O0990 Supervision of high risk pregnancy, unspecified, unspecified trimester: Secondary | ICD-10-CM | POA: Insufficient documentation

## 2015-11-18 ENCOUNTER — Ambulatory Visit: Admitting: Obstetrics and Gynecology

## 2015-11-18 DIAGNOSIS — O209 Hemorrhage in early pregnancy, unspecified: Secondary | ICD-10-CM | POA: Insufficient documentation

## 2015-11-18 DIAGNOSIS — Z3490 Encounter for supervision of normal pregnancy, unspecified, unspecified trimester: Secondary | ICD-10-CM

## 2015-11-18 NOTE — Progress Notes (Signed)
Pt scheduled for followup ultrasound to assess for viability. No visit today.

## 2015-11-18 NOTE — Progress Notes (Signed)
Denies abdominal pain, Having spotting. Rescheduled

## 2015-11-18 NOTE — Patient Instructions (Signed)

## 2015-11-19 ENCOUNTER — Other Ambulatory Visit: Payer: Self-pay | Admitting: General Practice

## 2015-11-19 ENCOUNTER — Telehealth: Payer: Self-pay | Admitting: Family Medicine

## 2015-11-19 ENCOUNTER — Ambulatory Visit (HOSPITAL_COMMUNITY)
Admission: RE | Admit: 2015-11-19 | Discharge: 2015-11-19 | Disposition: A | Discharge status: Home / Self Care | Source: Ambulatory Visit | Attending: Obstetrics and Gynecology | Admitting: Obstetrics and Gynecology

## 2015-11-19 DIAGNOSIS — O209 Hemorrhage in early pregnancy, unspecified: Secondary | ICD-10-CM | POA: Insufficient documentation

## 2015-11-19 DIAGNOSIS — Z3490 Encounter for supervision of normal pregnancy, unspecified, unspecified trimester: Secondary | ICD-10-CM

## 2015-11-19 DIAGNOSIS — Z3A01 Less than 8 weeks gestation of pregnancy: Secondary | ICD-10-CM | POA: Diagnosis not present

## 2015-11-19 DIAGNOSIS — O3680X0 Pregnancy with inconclusive fetal viability, not applicable or unspecified: Secondary | ICD-10-CM

## 2015-11-20 NOTE — Telephone Encounter (Signed)
Patient still has not recevied a call from the RN. Patient is not happy at this point because she sat for an hour, and was not seen. Wants to know where she goes from here.

## 2015-11-26 ENCOUNTER — Encounter (HOSPITAL_COMMUNITY): Payer: Self-pay | Admitting: *Deleted

## 2015-11-26 ENCOUNTER — Inpatient Hospital Stay (HOSPITAL_COMMUNITY)
Admission: AD | Admit: 2015-11-26 | Discharge: 2015-11-26 | Disposition: A | Discharge status: Home / Self Care | Source: Ambulatory Visit | Attending: Obstetrics & Gynecology | Admitting: Obstetrics & Gynecology

## 2015-11-26 ENCOUNTER — Inpatient Hospital Stay (HOSPITAL_COMMUNITY)

## 2015-11-26 DIAGNOSIS — Z880 Allergy status to penicillin: Secondary | ICD-10-CM | POA: Insufficient documentation

## 2015-11-26 DIAGNOSIS — O208 Other hemorrhage in early pregnancy: Secondary | ICD-10-CM | POA: Diagnosis not present

## 2015-11-26 DIAGNOSIS — O468X1 Other antepartum hemorrhage, first trimester: Secondary | ICD-10-CM

## 2015-11-26 DIAGNOSIS — Z3A01 Less than 8 weeks gestation of pregnancy: Secondary | ICD-10-CM | POA: Insufficient documentation

## 2015-11-26 DIAGNOSIS — O4691 Antepartum hemorrhage, unspecified, first trimester: Secondary | ICD-10-CM

## 2015-11-26 DIAGNOSIS — O418X10 Other specified disorders of amniotic fluid and membranes, first trimester, not applicable or unspecified: Secondary | ICD-10-CM

## 2015-11-26 DIAGNOSIS — O209 Hemorrhage in early pregnancy, unspecified: Secondary | ICD-10-CM

## 2015-11-26 NOTE — MAU Note (Signed)
PT SAYS  SHE WAS  IN MAU  2 WEEKS  AGO-  HAD  U/S.  THEN  WENT  TO CLINIC-   HAD U/S-   SAW HEARTBEAT.       HAS HAD VAG BLEEDING  SINCE THEN .     THEN TODAY  STARTED  AGAIN- SPOTTING -MOSTLY WHEN SHE WIPED- PINKISH RED.   NO PAD  ON IN TRIAGE.    HAS HAD MILD CRAMPS -   SOMETIMES- NO MEDS.

## 2015-11-26 NOTE — Discharge Instructions (Signed)
Pelvic Rest °Pelvic rest is sometimes recommended for women when:  °· The placenta is partially or completely covering the opening of the cervix (placenta previa). °· There is bleeding between the uterine wall and the amniotic sac in the first trimester (subchorionic hemorrhage). °· The cervix begins to open without labor starting (incompetent cervix, cervical insufficiency). °· The labor is too early (preterm labor). °HOME CARE INSTRUCTIONS °· Do not have sexual intercourse, stimulation, or an orgasm. °· Do not use tampons, douche, or put anything in the vagina. °· Do not lift anything over 10 pounds (4.5 kg). °· Avoid strenuous activity or straining your pelvic muscles. °SEEK MEDICAL CARE IF:  °· You have any vaginal bleeding during pregnancy. Treat this as a potential emergency. °· You have cramping pain felt low in the stomach (stronger than menstrual cramps). °· You notice vaginal discharge (watery, mucus, or bloody). °· You have a low, dull backache. °· There are regular contractions or uterine tightening. °SEEK IMMEDIATE MEDICAL CARE IF: °You have vaginal bleeding and have placenta previa.  °  °This information is not intended to replace advice given to you by your health care provider. Make sure you discuss any questions you have with your health care provider. °  °Document Released: 12/26/2010 Document Revised: 11/23/2011 Document Reviewed: 03/04/2015 °Elsevier Interactive Patient Education ©2016 Elsevier Inc. °Subchorionic Hematoma °A subchorionic hematoma is a gathering of blood between the outer wall of the placenta and the inner wall of the womb (uterus). The placenta is the organ that connects the fetus to the wall of the uterus. The placenta performs the feeding, breathing (oxygen to the fetus), and waste removal (excretory work) of the fetus.  °Subchorionic hematoma is the most common abnormality found on a result from ultrasonography done during the first trimester or early second trimester of  pregnancy. If there has been little or no vaginal bleeding, early small hematomas usually shrink on their own and do not affect your baby or pregnancy. The blood is gradually absorbed over 1-2 weeks. When bleeding starts later in pregnancy or the hematoma is larger or occurs in an older pregnant woman, the outcome may not be as good. Larger hematomas may get bigger, which increases the chances for miscarriage. Subchorionic hematoma also increases the risk of premature detachment of the placenta from the uterus, preterm (premature) labor, and stillbirth. °HOME CARE INSTRUCTIONS °· Stay on bed rest if your health care provider recommends this. Although bed rest will not prevent more bleeding or prevent a miscarriage, your health care provider may recommend bed rest until you are advised otherwise. °· Avoid heavy lifting (more than 10 lb [4.5 kg]), exercise, sexual intercourse, or douching as directed by your health care provider. °· Keep track of the number of pads you use each day and how soaked (saturated) they are. Write down this information. °· Do not use tampons. °· Keep all follow-up appointments as directed by your health care provider. Your health care provider may ask you to have follow-up blood tests or ultrasound tests or both. °SEEK IMMEDIATE MEDICAL CARE IF: °· You have severe cramps in your stomach, back, abdomen, or pelvis. °· You have a fever. °· You pass large clots or tissue. Save any tissue for your health care provider to look at. °· Your bleeding increases or you become lightheaded, feel weak, or have fainting episodes. °  °This information is not intended to replace advice given to you by your health care provider. Make sure you discuss any questions you have with   your health care provider. °  °Document Released: 12/16/2006 Document Revised: 09/21/2014 Document Reviewed: 03/30/2013 °Elsevier Interactive Patient Education ©2016 Elsevier Inc. ° °

## 2015-11-26 NOTE — MAU Provider Note (Signed)
History     CSN: [id number]  Arrival date and time: 11/26/15 [id number]   First Provider Initiated Contact with Patient 11/26/15 2017        Chief Complaint  Patient presents with  . Vaginal Bleeding   HPI  Cheryl [name] is a 30 y.o. [id number] at 4470w4d who presents for vaginal bleeding. Confirmed IUP last week. Reports 2 prior episodes of vaginal bleeding in this pregnancy. Some brown spotting this morning. Just prior to coming to the hospital saw pink/red spotting on toilet paper. Reports mild abdominal cramping.  Denise n/v/d. Some constipation; last BM today, no straining.  Last intercourse 2 weeks ago.  Denies urinary complaints or discharge.   OB History    Gravida Para Term Preterm AB TAB SAB Ectopic Multiple Living   6 2 1 1 3 1 1 1  0 1      Past Medical History  Diagnosis Date  . Anemia   . HELLP syndrome   . Preterm labor   . Pregnancy induced hypertension   . Depression     Past Surgical History  Procedure Laterality Date  . None    . No past surgeries    . Wisdom tooth extraction      Family History  Problem Relation Age of Onset  . Cancer Maternal Grandmother     Bladder  . Diabetes Maternal Grandmother   . Hypertension Maternal Grandmother   . Diabetes Maternal Grandfather   . Diabetes Mother   . Asthma Maternal Aunt     Social History  Substance Use Topics  . Smoking status: Never Smoker   . Smokeless tobacco: Never Used  . Alcohol Use: No    Allergies:  Allergies  Allergen Reactions  . Amoxicillin Rash and Swelling  . Flagyl [Metronidazole] Swelling and Rash  . Latex Rash  . Penicillins Swelling and Rash    Has patient had a PCN reaction causing immediate rash, facial/tongue/throat swelling, SOB or lightheadedness with hypotension: Yes Has patient had a PCN reaction causing severe rash involving mucus membranes or skin necrosis: Yes Has patient had a PCN reaction that required hospitalization No Has patient had a PCN reaction occurring  within the last 10 years: No If all of the above answers are "NO", then may proceed with Cephalosporin use.     Prescriptions prior to admission  Medication Sig Dispense Refill Last Dose  . acetaminophen (TYLENOL) 325 MG tablet Take 650 mg by mouth every 6 (six) hours as needed for headache.   11/25/2015 at Unknown time  . Prenatal Vit-Fe Fumarate-FA (PRENATAL MULTIVITAMIN) TABS tablet Take 1 tablet by mouth daily at 12 noon.   11/25/2015 at Unknown time  . Probiotic Product (PROBIOTIC DAILY) CAPS Take 1 capsule by mouth daily.   11/25/2015 at Unknown time    Review of Systems  Constitutional: Negative.   Gastrointestinal: Positive for nausea, abdominal pain (mild cramping) and constipation. Negative for vomiting and diarrhea.  Genitourinary: Negative for dysuria.       + vaginal bleeding   Physical Exam   Blood pressure 129/72, pulse 95, temperature 98.6 F (37 C), temperature source Oral, height 5\' 7"  (1.702 m), weight 222 lb (100.699 kg), last menstrual period 10/04/2015, unknown if currently breastfeeding.  Physical Exam  Nursing note and vitals reviewed. Constitutional: She is oriented to person, place, and time. She appears well-developed and well-nourished. No distress.  HENT:  Head: Normocephalic and atraumatic.  Eyes: Conjunctivae are normal. Right eye exhibits no discharge. Left eye exhibits  no discharge. No scleral icterus.  Neck: Normal range of motion.  Cardiovascular: Normal rate, regular rhythm and normal heart sounds.   No murmur heard. Respiratory: Effort normal and breath sounds normal. No respiratory distress. She has no wheezes.  GI: Soft. Bowel sounds are normal. There is no tenderness.  Genitourinary:  Minimal amount of brown discharge in vagina. Cervix closed  Neurological: She is alert and oriented to person, place, and time.  Skin: Skin is warm and dry. She is not diaphoretic.  Psychiatric: She has a normal mood and affect. Her behavior is normal. Judgment  and thought content normal.   US Ob Transvaginal  11/26/2015  CLINICAL DATA:  Acute onset of vaginal bleeding.  Initial encounter. EXAM: TRANSVAGINAL OB ULTRASOUND TECHNIQUE: Transvaginal ultrasound was performed for complete evaluation of the gestation as well as the maternal uterus, adnexal regions, and pelvic cul-de-sac. COMPARISON:  Pelvic ultrasound performed 11/19/2015 FINDINGS: Intrauterine gestational sac: Visualized/normal in shape. Yolk sac:  Yes Embryo:  Yes Cardiac Activity: Yes Heart Rate: 130 bpm CRL:   6.4  mm   6 w 3 d                  [place] EDC: 07/18/2016 Subchorionic hemorrhage: A small amount of subchorionic hemorrhage is noted. Maternal uterus/adnexae: The uterus is otherwise unremarkable. The right ovary measures 2.8 x 1.3 x 2.7 cm, while the left ovary measures 3.5 x 1.7 x 2.1 cm. No suspicious adnexal masses are seen; there is no evidence for ovarian torsion. No free fluid is seen within the pelvic cul-de-sac. IMPRESSION: 1. Single live intrauterine pregnancy noted, with a crown-rump length of 6 mm, corresponding to a gestational age of [redacted] weeks 3 days. The prior ultrasound already confirmed a gestational age of [redacted] weeks 4 days by LMP, reflecting an estimated date of delivery of July 10, 2016. 2. Small amount of subchorionic hemorrhage noted, new from the prior study. Electronically Signed   By: Roanna Raider M.D.   On: 11/26/2015 21:35    MAU Course  Procedures  MDM O positive 2103- Care turned over to [facility]      Judeth Horn, NP   2103 - Care assumed from Judeth Horn, NP. Patient awaiting [place]. Bedside confirmed IUP, but FHR undetectable due to size of gestation.   Assessment and Plan  A: SIUP at [redacted]w[redacted]d Small subchorionic hemorrhage  P: Discharge home Bleeding precautions discussed Patient advised to follow-up with OB provider of choice to start prenatal care Patient may return to MAU as needed or if her condition were to change or worsen  Marny Lowenstein,  PA-C  11/26/2015 9:40 PM

## 2015-12-03 ENCOUNTER — Inpatient Hospital Stay (HOSPITAL_COMMUNITY)

## 2015-12-03 ENCOUNTER — Inpatient Hospital Stay (HOSPITAL_COMMUNITY)
Admission: AD | Admit: 2015-12-03 | Discharge: 2015-12-03 | Disposition: A | Discharge status: Home / Self Care | Source: Ambulatory Visit | Attending: Obstetrics & Gynecology | Admitting: Obstetrics & Gynecology

## 2015-12-03 ENCOUNTER — Encounter: Admitting: Obstetrics and Gynecology

## 2015-12-03 ENCOUNTER — Encounter (HOSPITAL_COMMUNITY): Payer: Self-pay | Admitting: *Deleted

## 2015-12-03 DIAGNOSIS — O209 Hemorrhage in early pregnancy, unspecified: Secondary | ICD-10-CM

## 2015-12-03 DIAGNOSIS — O09219 Supervision of pregnancy with history of pre-term labor, unspecified trimester: Secondary | ICD-10-CM

## 2015-12-03 DIAGNOSIS — O034 Incomplete spontaneous abortion without complication: Secondary | ICD-10-CM | POA: Diagnosis not present

## 2015-12-03 DIAGNOSIS — O09899 Supervision of other high risk pregnancies, unspecified trimester: Secondary | ICD-10-CM

## 2015-12-03 DIAGNOSIS — Z3A08 8 weeks gestation of pregnancy: Secondary | ICD-10-CM | POA: Insufficient documentation

## 2015-12-03 DIAGNOSIS — O0990 Supervision of high risk pregnancy, unspecified, unspecified trimester: Secondary | ICD-10-CM

## 2015-12-03 DIAGNOSIS — R109 Unspecified abdominal pain: Secondary | ICD-10-CM

## 2015-12-03 DIAGNOSIS — O26899 Other specified pregnancy related conditions, unspecified trimester: Secondary | ICD-10-CM

## 2015-12-03 LAB — URINALYSIS, ROUTINE W REFLEX MICROSCOPIC
Glucose, UA: NEGATIVE mg/dL
Ketones, ur: 15 mg/dL — AB
Leukocytes, UA: NEGATIVE
NITRITE: NEGATIVE
PH: 6.5 (ref 5.0–8.0)
Protein, ur: 100 mg/dL — AB
Specific Gravity, Urine: 1.015 (ref 1.005–1.030)

## 2015-12-03 LAB — URINE MICROSCOPIC-ADD ON

## 2015-12-03 LAB — CBC
HCT: 40 % (ref 36.0–46.0)
Hemoglobin: 13.2 g/dL (ref 12.0–15.0)
MCH: 27.7 pg (ref 26.0–34.0)
MCHC: 33 g/dL (ref 30.0–36.0)
MCV: 84 fL (ref 78.0–100.0)
Platelets: 80 10*3/uL — ABNORMAL LOW (ref 150–400)
RBC: 4.76 MIL/uL (ref 3.87–5.11)
RDW: 13.8 % (ref 11.5–15.5)
WBC: 3.5 10*3/uL — ABNORMAL LOW (ref 4.0–10.5)

## 2015-12-03 LAB — HCG, QUANTITATIVE, PREGNANCY: hCG, Beta Chain, Quant, S: 19606 m[IU]/mL — ABNORMAL HIGH (ref <5)

## 2015-12-03 MED ORDER — ACETAMINOPHEN-CODEINE #3 300-30 MG PO TABS: 1.0000 | ORAL_TABLET | Freq: Four times a day (QID) | ORAL | Status: DC | PRN

## 2015-12-03 MED ORDER — IBUPROFEN 600 MG PO TABS: 600.0000 mg | ORAL_TABLET | Freq: Four times a day (QID) | ORAL | Status: DC | PRN

## 2015-12-03 MED ORDER — MISOPROSTOL 200 MCG PO TABS: 800.0000 ug | ORAL_TABLET | Freq: Once | ORAL | Status: DC

## 2015-12-03 MED ORDER — PROMETHAZINE HCL 25 MG PO TABS: 25.0000 mg | ORAL_TABLET | Freq: Four times a day (QID) | ORAL | Status: DC | PRN

## 2015-12-03 NOTE — Discharge Instructions (Signed)
Misoprostol tablets What is this medicine? MISOPROSTOL (mye soe PROST ole) helps to prevent stomach ulcers in patients who take medicines like ibuprofen and aspirin and who are at high risk of complications from ulcers. This medicine may be used for other purposes; ask your health care provider or pharmacist if you have questions. What should I tell my health care provider before I take this medicine? They need to know if you have any of these conditions: -Crohn's disease -heart disease -kidney disease -ulcerative colitis -an unusual or allergic reaction to misoprostol, prostaglandins, other medicines, foods, dyes, or preservatives -pregnant or trying to get pregnant -breast-feeding How should I use this medicine? Take this medicine by mouth with a full glass of water. Follow the directions on the prescription label. Take this medicine with food.Take your medicine at regular intervals. Do not take your medicine more often than directed. Talk to your pediatrician regarding the use of this medicine in children. Special care may be needed. Overdosage: If you think you have taken too much of this medicine contact a poison control center or emergency room at once. NOTE: This medicine is only for you. Do not share this medicine with others. What if I miss a dose? If you miss a dose, take it as soon as you can. If it is almost time for your next dose, take only that dose. Do not take double or extra doses. What may interact with this medicine? -antacids This list may not describe all possible interactions. Give your health care provider a list of all the medicines, herbs, non-prescription drugs, or dietary supplements you use. Also tell them if you smoke, drink alcohol, or use illegal drugs. Some items may interact with your medicine. What should I watch for while using this medicine? Do not smoke cigarettes or drink alcohol. These increase irritation to your stomach and can make it more susceptible  to damage from medicine like ibuprofen and aspirin. If you are female, do not use this medicine if you are pregnant. Do not get pregnant while taking this medicine and for at least one month (one full menstrual cycle) after stopping this medicine. If you can become pregnant, use a reliable form of birth control while taking this medicine. Talk to your doctor about birth control options. If you do become pregnant, think you are pregnant, or want to become pregnant, immediately call your doctor for advice. What side effects may I notice from receiving this medicine? Side effects that you should report to your doctor or health care professional as soon as possible: -allergic reactions like skin rash, itching or hives, swelling of the face, lips, or tongue -chest pain -fainting spells -severe diarrhea -sudden shortness of breath -unusual vaginal bleeding, pelvic pain, or cramping Side effects that usually do not require medical attention (report to your doctor or health care professional if they continue or are bothersome): -dizziness -headache -menstrual irregularity, spotting, or cramps -mild diarrhea -nausea -stomach upset or cramps This list may not describe all possible side effects. Call your doctor for medical advice about side effects. You may report side effects to FDA at 1-800-FDA-1088. Where should I keep my medicine? Keep out of the reach of children. Store at room temperature below 25 degrees C (77 degrees F). Keep in a dry place. Protect from moisture. Throw away any unused medicine after the expiration date. NOTE: This sheet is a summary. It may not cover all possible information. If you have questions about this medicine, talk to your doctor, pharmacist, or  health care provider.    2016, Elsevier/Gold Standard. (2008-08-14 10:59:53) Incomplete Miscarriage A miscarriage is the sudden loss of an unborn baby (fetus) before the 20th week of pregnancy. In an incomplete miscarriage,  parts of the fetus or placenta (afterbirth) remain in the body.  Having a miscarriage can be an emotional experience. Talk with your health care provider about any questions you may have about miscarrying, the grieving process, and your future pregnancy plans. CAUSES   Problems with the fetal chromosomes that make it impossible for the baby to develop normally. Problems with the baby's genes or chromosomes are most often the result of errors that occur by chance as the embryo divides and grows. The problems are not inherited from the parents.  Infection of the cervix or uterus.  Hormone problems.  Problems with the cervix, such as having an incompetent cervix. This is when the tissue in the cervix is not strong enough to hold the pregnancy.  Problems with the uterus, such as an abnormally shaped uterus, uterine fibroids, or congenital abnormalities.  Certain medical conditions.  Smoking, drinking alcohol, or taking illegal drugs.  Trauma. SYMPTOMS   Vaginal bleeding or spotting, with or without cramps or pain.  Pain or cramping in the abdomen or lower back.  Passing fluid, tissue, or blood clots from the vagina. DIAGNOSIS  Your health care provider will perform a physical exam. You may also have an ultrasound to confirm the miscarriage. Blood or urine tests may also be ordered. TREATMENT   Usually, a dilation and curettage (D&C) procedure is performed. During a D&C procedure, the cervix is widened (dilated) and any remaining fetal or placental tissue is gently removed from the uterus.  Antibiotic medicines are prescribed if there is an infection. Other medicines may be given to reduce the size of the uterus (contract) if there is a lot of bleeding.  If you have Rh negative blood and your baby was Rh positive, you will need a Rho (D) immune globulin shot. This shot will protect any future baby from having Rh blood problems in future pregnancies.  You may be confined to bed rest.  This means you should stay in bed and only get up to use the bathroom. HOME CARE INSTRUCTIONS   Rest as directed by your health care provider.  Restrict activity as directed by your health care provider. You may be allowed to continue light activity if curettage was not done but you require further treatment.  Keep track of the number of pads you use each day. Keep track of how soaked (saturated) they are. Record this information.  Do not  use tampons.  Do not douche or have sexual intercourse until approved by your health care provider.  Keep all follow-up appointments for reevaluation and continuing management.  Only take over-the-counter or prescription medicines for pain, fever, or discomfort as directed by your health care provider.  Take antibiotic medicine as directed by your health care provider. Make sure you finish it even if you start to feel better. SEEK IMMEDIATE MEDICAL CARE IF:   You experience severe cramps in your stomach, back, or abdomen.  You have an unexplained temperature (make sure to record these temperatures).  You pass large clots or tissue (save these for your health care provider to inspect).  Your bleeding increases.  You become light-headed, weak, or have fainting episodes. MAKE SURE YOU:   Understand these instructions.  Will watch your condition.  Will get help right away if you are not doing  well or get worse.   This information is not intended to replace advice given to you by your health care provider. Make sure you discuss any questions you have with your health care provider.   Document Released: 08/31/2005 Document Revised: 09/21/2014 Document Reviewed: 03/30/2013 Elsevier Interactive Patient Education [employer]2016 Elsevier Inc.

## 2015-12-03 NOTE — MAU Note (Signed)
Pt states she woke up this morning, went to [facility], states blood was dripping in toilet.  Has very mild cramping.  [facility] days ago.

## 2015-12-03 NOTE — MAU Provider Note (Signed)
History     CSN: [id number]  Arrival date and time: 12/03/15 1239   First Provider Initiated Contact with Patient 12/03/15 1334      Chief Complaint  Patient presents with  . Vaginal Bleeding   HPI  Cheryl [name] 30 y.o. [id number] @ [redacted]w[redacted]d presents to MAU with c/o of moderate to heavy bleeding that occurred a few hours ago and has since slowed down. Describes cramping as mild rated at a 3.   Past Medical History  Diagnosis Date  . Anemia   . HELLP syndrome   . Preterm labor   . Pregnancy induced hypertension   . Depression     Past Surgical History  Procedure Laterality Date  . None    . No past surgeries    . Wisdom tooth extraction      Family History  Problem Relation Age of Onset  . Cancer Maternal Grandmother     Bladder  . Diabetes Maternal Grandmother   . Hypertension Maternal Grandmother   . Diabetes Maternal Grandfather   . Diabetes Mother   . Asthma Maternal Aunt     Social History  Substance Use Topics  . Smoking status: Never Smoker   . Smokeless tobacco: Never Used  . Alcohol Use: No    Allergies:  Allergies  Allergen Reactions  . Amoxicillin Rash and Swelling  . Flagyl [Metronidazole] Swelling and Rash  . Latex Rash  . Penicillins Swelling and Rash    Has patient had a PCN reaction causing immediate rash, facial/tongue/throat swelling, SOB or lightheadedness with hypotension: Yes Has patient had a PCN reaction causing severe rash involving mucus membranes or skin necrosis: Yes Has patient had a PCN reaction that required hospitalization No Has patient had a PCN reaction occurring within the last 10 years: No If all of the above answers are "NO", then may proceed with Cephalosporin use.     Prescriptions prior to admission  Medication Sig Dispense Refill Last Dose  . Prenatal Vit-Fe Fumarate-FA (PRENATAL MULTIVITAMIN) TABS tablet Take 1 tablet by mouth daily at 12 noon.   12/02/2015 at Unknown time  . Probiotic Product (PROBIOTIC  DAILY) CAPS Take 1 capsule by mouth daily.   12/02/2015 at Unknown time    Review of Systems  Constitutional: Negative for fever.  Gastrointestinal: Positive for abdominal pain.  Genitourinary:       Vaginal bleeding  All other systems reviewed and are negative.  Physical Exam   Blood pressure 131/71, pulse 97, temperature 98.7 F (37.1 C), temperature source Oral, resp. rate 18, last menstrual period 10/04/2015, unknown if currently breastfeeding.  Physical Exam  Nursing note and vitals reviewed. Constitutional: She is oriented to person, place, and time. She appears well-developed and well-nourished. No distress.  HENT:  Head: Normocephalic and atraumatic.  Neck: Normal range of motion.  Cardiovascular: Normal rate.   Respiratory: Effort normal and breath sounds normal. No respiratory distress.  GI: Soft. There is no tenderness.  Genitourinary:    Cervix appears to be open at 1-2 cm; moderate blood clots in os  Musculoskeletal: Normal range of motion.  Neurological: She is alert and oriented to person, place, and time.  Skin: Skin is warm and dry.  Psychiatric: She has a normal mood and affect. Her behavior is normal. Judgment and thought content normal.   US Ob Transvaginal  12/03/2015  CLINICAL DATA:  Pregnant patient with vaginal bleeding for 3 weeks. Subsequent encounter. EXAM: TRANSVAGINAL OB ULTRASOUND TECHNIQUE: Transvaginal ultrasound was performed for complete  evaluation of the gestation as well as the maternal uterus, adnexal regions, and pelvic cul-de-sac. COMPARISON:  Ob ultrasound 11/26/2015. FINDINGS: Intrauterine gestational sac: Visualized/normal in shape. Yolk sac:  Visualized. Embryo:  Visualized. Cardiac Activity: Not detected. CRL:   1.48  mm   7 w 6 d                  US EDC: 07/15/2016 Subchorionic hemorrhage:  None visualized. Maternal uterus/adnexae: Unremarkable. IMPRESSION: Findings meet definitive criteria for failed pregnancy. This follows SRU consensus  guidelines: Diagnostic Criteria for Nonviable Pregnancy Early in the First Trimester. Macy Mis Engl J Med [phone]. Electronically Signed   By: Drusilla Kannerhomas  Dalessio M.D.   On: 12/03/2015 15:54     Early Intrauterine Pregnancy Failure Protocol   X Documented intrauterine pregnancy failure less than or equal to [redacted] weeks gestation  X No serious current illness  X Baseline Hgb greater than or equal to 10g/dl  X Patient has easily accessible transportation to the hospital  X Clear preference  X Practitioner/physician deems patient reliable  X Counseling by practitioner or physician  X Patient education by RN  X Consent form signed  Rho-Gam given by RN if indicated  X Medication dispensed  X__Cytotec 800 mcg Intravaginally by patient at home  X Intravaginally by NP in MAU  Rectally by patient at home  Rectally by RN in MAU  X Ibuprofen 600 mg 1 tablet by mouth every 6 hours as needed #30 - prescribed  X Tylenol #3 mg by mouth every 4 to 6 hours as needed - prescribed  __X Phenergan 12.5 mg by mouth every 4 hours as needed for nausea - prescribed    MAU Course  Procedures   Assessment and Plan  First trimester Loss Incomplete AB Cytotec per pt @ home Bleeding precautions Follow up in clinic in one week Discharge   Shameer Molstad Grissett 12/03/2015, 1:34 PM

## 2015-12-04 NOTE — Telephone Encounter (Signed)
Pt reports that she believes that she passed all POC last night without problems. Is only bleeding lightly now. She did not take the cytotec. Told pt to follow up with hematology because her platelets were 80,000 on her cbc yesterday. She is to have a follow up appointment on December 18, 2015

## 2015-12-18 ENCOUNTER — Encounter: Admitting: Obstetrics and Gynecology

## 2016-01-07 ENCOUNTER — Encounter: Admitting: Obstetrics and Gynecology

## 2016-03-11 IMAGING — US US OB TRANSVAGINAL
1 series · 15 of 28 positions shown · non-contrast
Comparison: Ob ultrasound 11/07/2015

CLINICAL DATA: Assess for viability.  Vaginal bleeding.

EXAM:
TRANSVAGINAL OB ULTRASOUND
TECHNIQUE: Transvaginal ultrasound was performed for complete evaluation of the
gestation as well as the maternal uterus, adnexal regions, and
pelvic cul-de-sac.

[Series 1: us ob transvaginal · 15 of 46 slices shown]
[im 1/46]
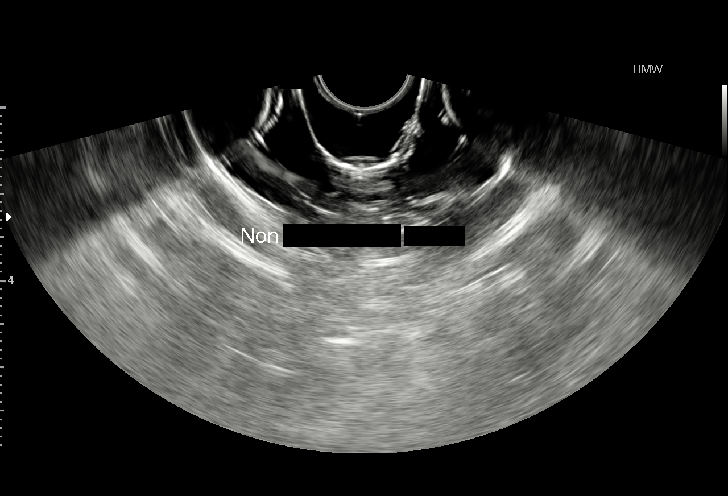
[im 4/46]
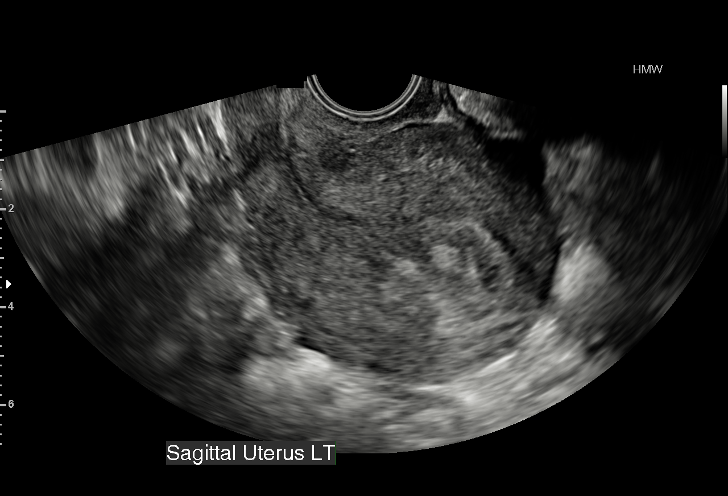
[im 7/46]
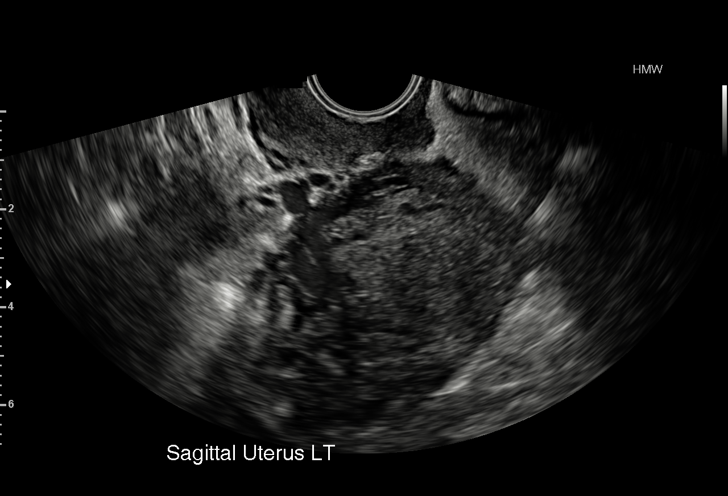
[im 11/46]
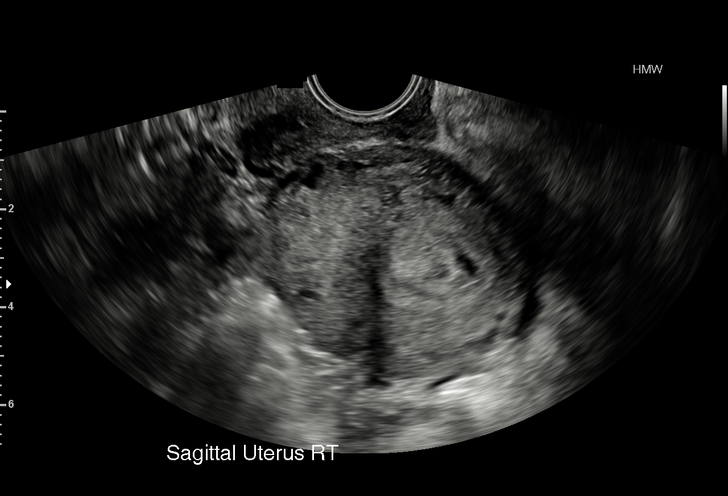
[im 14/46]
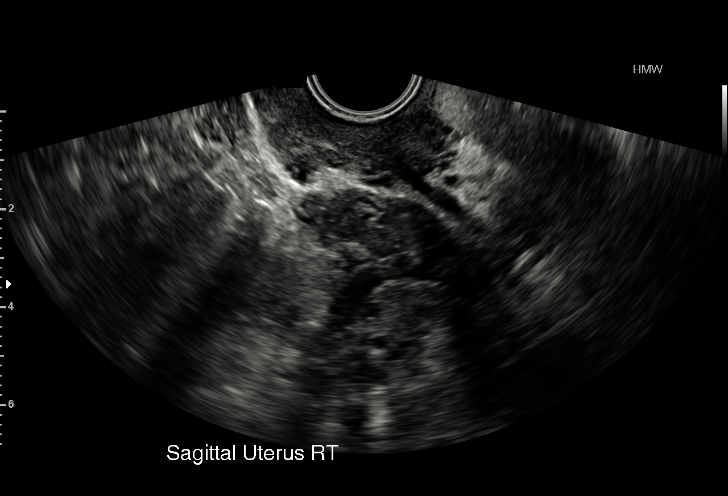
[im 17/46]
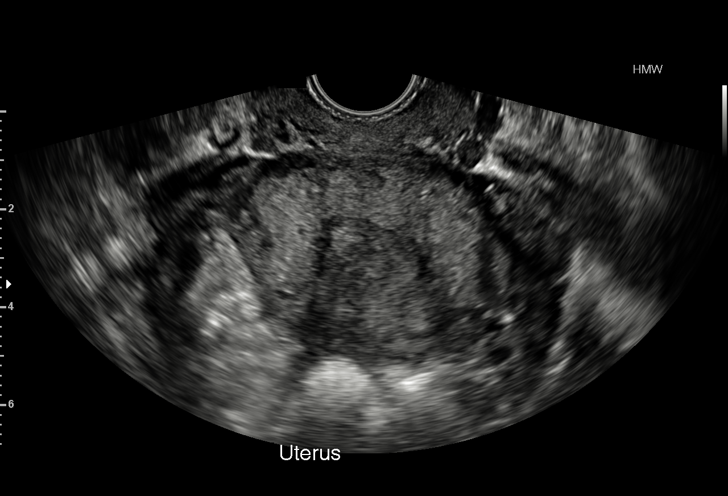
[im 21/46]
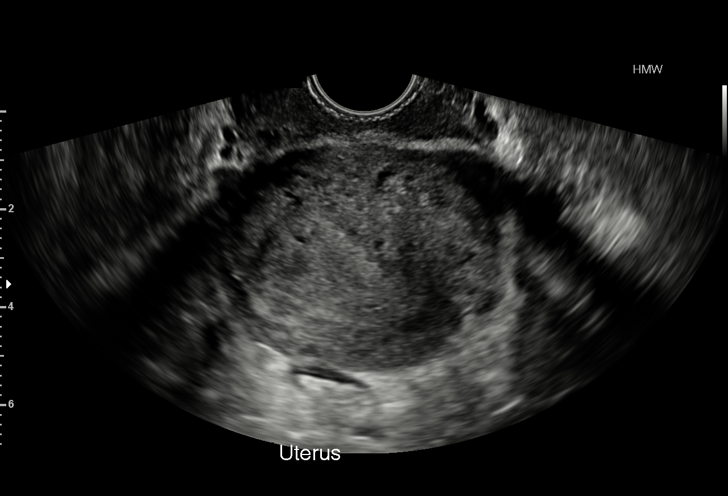
[im 24/46]
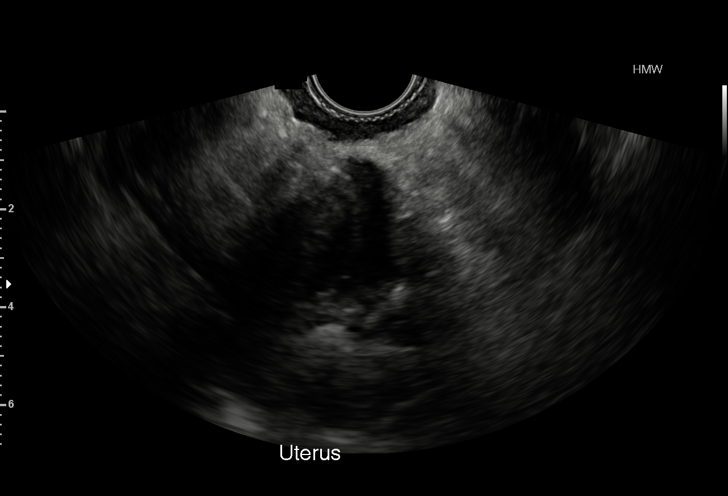
[im 26/46]
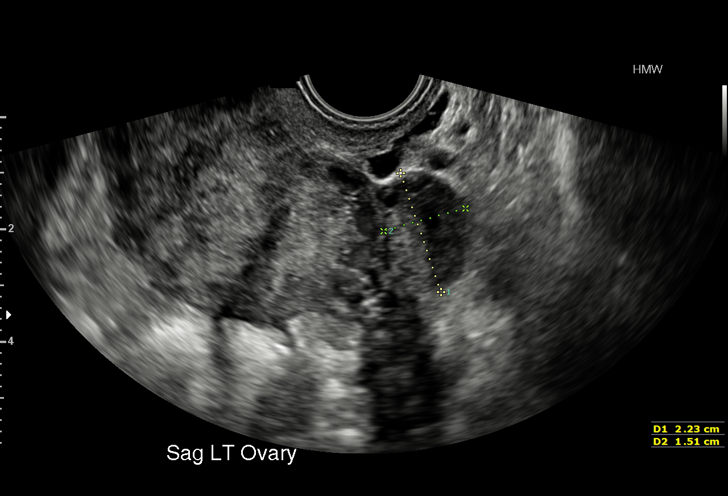
[im 29/46]
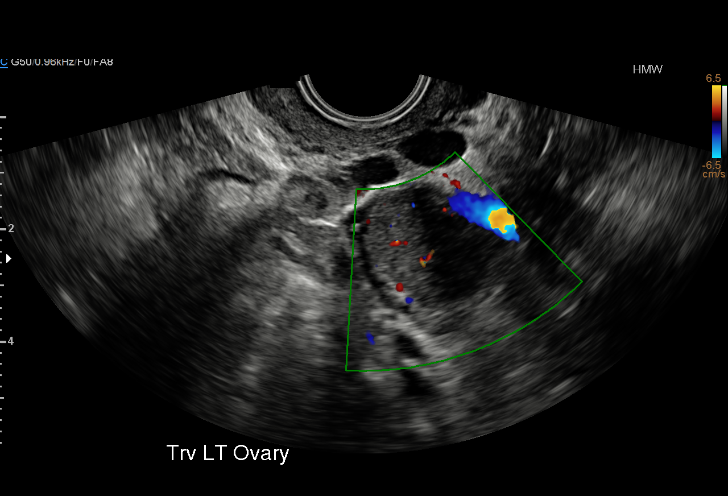
[im 32/46]
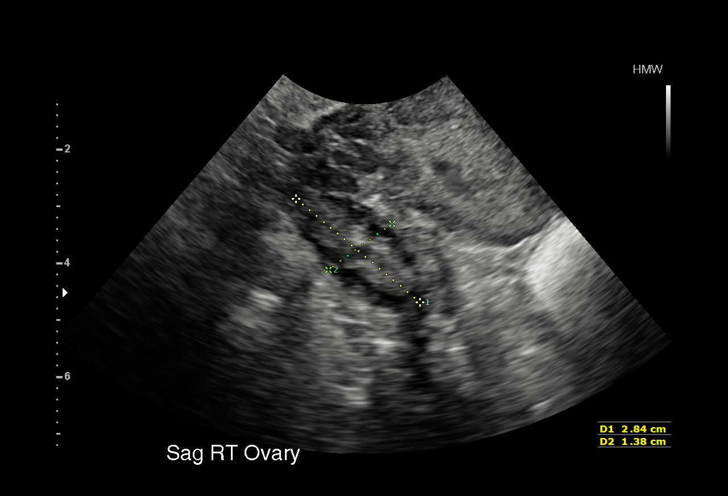
[im 36/46]
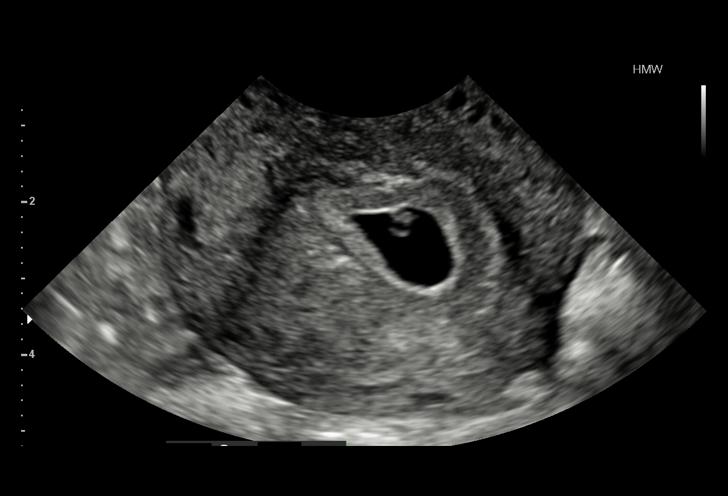
[im 39/46]
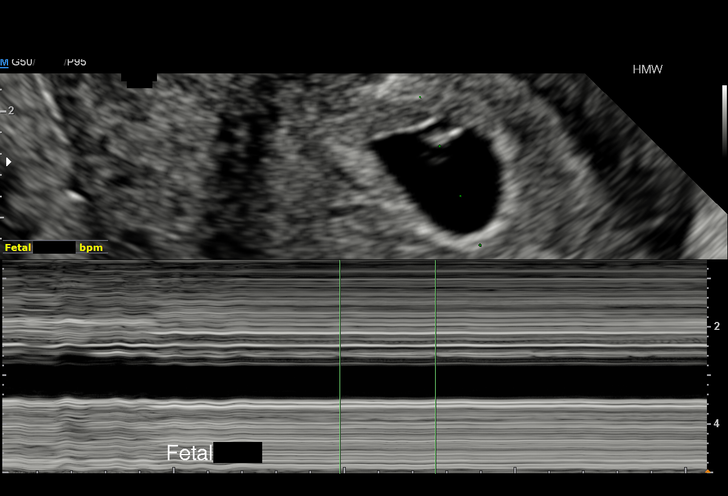
[im 42/46]
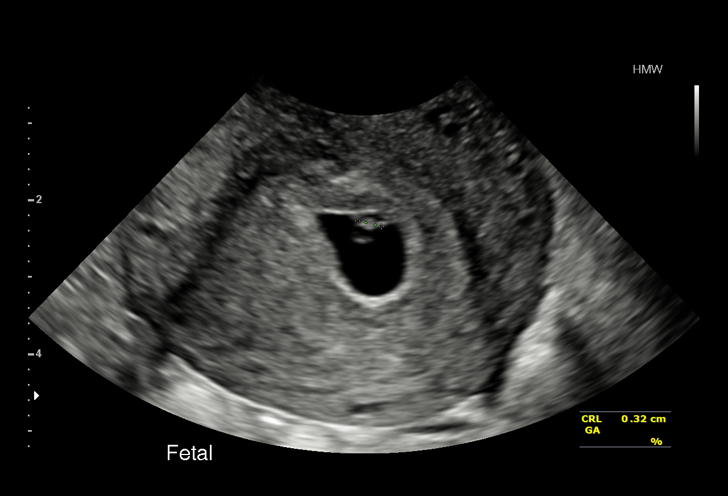
[im 46/46]
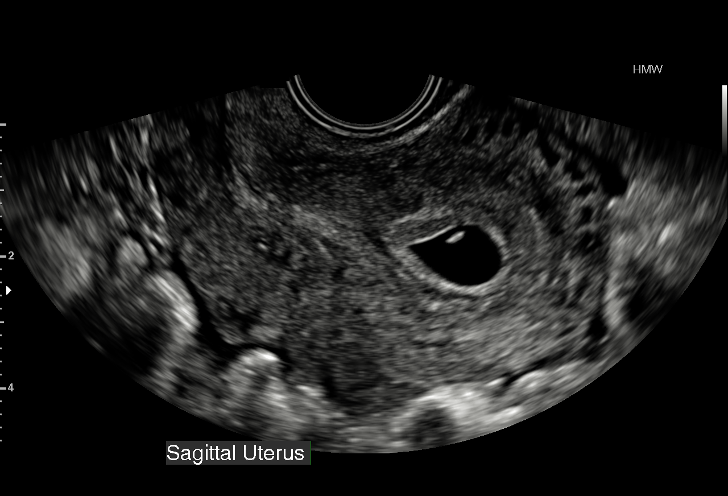

[15 of 28 positions shown; findings below may reference images not displayed]

FINDINGS: Intrauterine gestational sac: Visualized/normal in shape.

Yolk sac:  Present

Embryo:  Present

Cardiac Activity: Present

Heart Rate: 108 bpm

CRL:   3.6  mm   6 w 0 d                  US EDC: 07/14/2016

Subchorionic hemorrhage:  None visualized.

Maternal uterus/adnexae: Normal right left ovaries. Trace fluid in
the pelvis.
IMPRESSION: Single live intrauterine gestation.  No subchorionic hemorrhage.

## 2016-09-11 ENCOUNTER — Encounter (HOSPITAL_COMMUNITY): Payer: Self-pay

## 2016-09-12 ENCOUNTER — Encounter (HOSPITAL_COMMUNITY): Payer: Self-pay

## 2016-09-12 ENCOUNTER — Inpatient Hospital Stay (HOSPITAL_COMMUNITY)
Admission: AD | Admit: 2016-09-12 | Discharge: 2016-09-12 | Disposition: A | Discharge status: Home / Self Care | Source: Ambulatory Visit | Attending: Obstetrics and Gynecology | Admitting: Obstetrics and Gynecology

## 2016-09-12 DIAGNOSIS — M542 Cervicalgia: Secondary | ICD-10-CM | POA: Diagnosis present

## 2016-09-12 DIAGNOSIS — Z880 Allergy status to penicillin: Secondary | ICD-10-CM | POA: Insufficient documentation

## 2016-09-12 DIAGNOSIS — M62838 Other muscle spasm: Secondary | ICD-10-CM | POA: Diagnosis not present

## 2016-09-12 LAB — URINALYSIS, ROUTINE W REFLEX MICROSCOPIC
BILIRUBIN URINE: NEGATIVE
GLUCOSE, UA: NEGATIVE mg/dL
Ketones, ur: NEGATIVE mg/dL
NITRITE: NEGATIVE
PH: 7 (ref 5.0–8.0)
Protein, ur: NEGATIVE mg/dL
SPECIFIC GRAVITY, URINE: 1.004 — AB (ref 1.005–1.030)

## 2016-09-12 LAB — POCT PREGNANCY, URINE: Preg Test, Ur: POSITIVE — AB

## 2016-09-12 MED ORDER — CYCLOBENZAPRINE HCL 10 MG PO TABS
10.0000 mg | ORAL_TABLET | Freq: Once | ORAL | Status: AC
  Administered 2016-09-12: 10 mg via ORAL
  Filled 2016-09-12: qty 1

## 2016-09-12 MED ORDER — CYCLOBENZAPRINE HCL 10 MG PO TABS: 10.0000 mg | ORAL_TABLET | Freq: Three times a day (TID) | ORAL | 1 refills | Status: DC

## 2016-09-12 MED ORDER — IBUPROFEN 800 MG PO TABS: 800.0000 mg | ORAL_TABLET | Freq: Three times a day (TID) | ORAL | 0 refills | Status: DC

## 2016-09-12 MED ORDER — IBUPROFEN 800 MG PO TABS
800.0000 mg | ORAL_TABLET | Freq: Once | ORAL | Status: AC
  Administered 2016-09-12: 800 mg via ORAL
  Filled 2016-09-12: qty 1

## 2016-09-12 NOTE — Discharge Instructions (Signed)
Muscle Cramps and Spasms Muscle cramps and spasms occur when a muscle or muscles tighten and you have no control over this tightening (involuntary muscle contraction). They are a common problem and can develop in any muscle. The most common place is in the calf muscles of the leg. Both muscle cramps and muscle spasms are involuntary muscle contractions, but they also have differences:   Muscle cramps are sporadic and painful. They may last a few seconds to a quarter of an hour. Muscle cramps are often more forceful and last longer than muscle spasms.  Muscle spasms may or may not be painful. They may also last just a few seconds or much longer. CAUSES  It is uncommon for cramps or spasms to be due to a serious underlying problem. In many cases, the cause of cramps or spasms is unknown. Some common causes are:   Overexertion.   Overuse from repetitive motions (doing the same thing over and over).   Remaining in a certain position for a long period of time.   Improper preparation, form, or technique while performing a sport or activity.   Dehydration.   Injury.   Side effects of some medicines.   Abnormally low levels of the salts and ions in your blood (electrolytes), especially potassium and calcium. This could happen if you are taking water pills (diuretics) or you are pregnant.  Some underlying medical problems can make it more likely to develop cramps or spasms. These include, but are not limited to:   Diabetes.   Parkinson disease.   Hormone disorders, such as thyroid problems.   Alcohol abuse.   Diseases specific to muscles, joints, and bones.   Blood vessel disease where not enough blood is getting to the muscles.  HOME CARE INSTRUCTIONS   Stay well hydrated. Drink enough water and fluids to keep your urine clear or pale yellow.  It may be helpful to massage, stretch, and relax the affected muscle.  For tight or tense muscles, use a warm towel, heating  pad, or hot shower water directed to the affected area.  If you are sore or have pain after a cramp or spasm, applying ice to the affected area may relieve discomfort.  Put ice in a plastic bag.  Place a towel between your skin and the bag.  Leave the ice on for 15-20 minutes, 3-4 times a day.  Medicines used to treat a known cause of cramps or spasms may help reduce their frequency or severity. Only take over-the-counter or prescription medicines as directed by your caregiver. SEEK MEDICAL CARE IF:  Your cramps or spasms get more severe, more frequent, or do not improve over time.  MAKE SURE YOU:   Understand these instructions.  Will watch your condition.  Will get help right away if you are not doing well or get worse. This information is not intended to replace advice given to you by your health care provider. Make sure you discuss any questions you have with your health care provider. Document Released: 02/20/2002 Document Revised: 12/26/2012 Document Reviewed: 06/04/2015 Elsevier Interactive Patient Education  2017 Elsevier Inc.  

## 2016-09-12 NOTE — MAU Note (Signed)
Patient presents with neck pain was seen at [facility] 2 days ago and had possible pregnancy test, pain has been there since 12/19

## 2016-09-12 NOTE — MAU Provider Note (Signed)
History     CSN: [id number]  Arrival date and time: 09/12/16 1646   First Provider Initiated Contact with Patient 09/12/16 1717      Chief Complaint  Patient presents with  . Neck Pain   Neck Pain   This is a new problem. Episode onset: started 09/01/16  The problem occurs constantly. The problem has been unchanged. The pain is associated with a sleep position. The pain is present in the midline. The quality of the pain is described as stabbing. The pain is at a severity of 8/10. The pain is same all the time. Pertinent negatives include no fever. She has tried acetaminophen, home exercises and heat for the symptoms. The treatment provided no relief.   Past Medical History:  Diagnosis Date  . Anemia   . Depression   . HELLP syndrome   . Pregnancy induced hypertension   . Preterm labor     Past Surgical History:  Procedure Laterality Date  . NO PAST SURGERIES    . None    . WISDOM TOOTH EXTRACTION      Family History  Problem Relation Age of Onset  . Cancer Maternal Grandmother     Bladder  . Diabetes Maternal Grandmother   . Hypertension Maternal Grandmother   . Diabetes Maternal Grandfather   . Diabetes Mother   . Asthma Maternal Aunt     Social History  Substance Use Topics  . Smoking status: Never Smoker  . Smokeless tobacco: Never Used  . Alcohol use No    Allergies:  Allergies  Allergen Reactions  . Amoxicillin Rash and Swelling  . Flagyl [Metronidazole] Swelling and Rash  . Latex Rash  . Penicillins Swelling and Rash    Has patient had a PCN reaction causing immediate rash, facial/tongue/throat swelling, SOB or lightheadedness with hypotension: Yes Has patient had a PCN reaction causing severe rash involving mucus membranes or skin necrosis: Yes Has patient had a PCN reaction that required hospitalization No Has patient had a PCN reaction occurring within the last 10 years: No If all of the above answers are "NO", then may proceed with  Cephalosporin use.     Prescriptions Prior to Admission  Medication Sig Dispense Refill Last Dose  . acetaminophen-codeine (TYLENOL #3) 300-30 MG tablet Take 1-2 tablets by mouth every 6 (six) hours as needed for moderate pain. 20 tablet 0   . ibuprofen (ADVIL,MOTRIN) 600 MG tablet Take 1 tablet (600 mg total) by mouth every 6 (six) hours as needed for moderate pain or cramping. 30 tablet 0   . misoprostol (CYTOTEC) 200 MCG tablet Place 4 tablets (800 mcg total) vaginally once. 4 tablet 0   . Prenatal Vit-Fe Fumarate-FA (PRENATAL MULTIVITAMIN) TABS tablet Take 1 tablet by mouth daily at 12 noon.   12/02/2015 at Unknown time  . Probiotic Product (PROBIOTIC DAILY) CAPS Take 1 capsule by mouth daily.   12/02/2015 at Unknown time  . promethazine (PHENERGAN) 25 MG tablet Take 1 tablet (25 mg total) by mouth every 6 (six) hours as needed for nausea or vomiting. 30 tablet 0     Review of Systems  Constitutional: Negative for chills and fever.  Gastrointestinal: Negative for abdominal pain, nausea and vomiting.  Genitourinary:       No vaginal bleeding   Musculoskeletal: Positive for neck pain.   Physical Exam   Blood pressure 134/71, pulse 83, temperature 98.2 F (36.8 C), temperature source Oral, resp. rate 18, height 5\' 7"  (1.702 m), weight 219 lb (99.3  kg), last menstrual period 08/15/2016, unknown if currently breastfeeding.  Physical Exam  Nursing note and vitals reviewed. Constitutional: She is oriented to person, place, and time. She appears well-developed and well-nourished.  HENT:  Head: Normocephalic.  Cardiovascular: Normal rate.   Respiratory: Effort normal.  GI: Soft.  Musculoskeletal: She exhibits no edema or tenderness.  Limited ROM to neck   Neurological: She is alert and oriented to person, place, and time.  Skin: Skin is warm and dry.  Psychiatric: She has a normal mood and affect.    MAU Course  Procedures  MDM D/W the patient that short course of ibuprofen is  ok in early pregnancy. Will DC home with ibuprofen 800mg  every 8 hours x 2 days and flexeril PRN   Assessment and Plan   1. Muscle spasm    DC home Comfort measures reviewed  1st Trimester precautions  RX: flexeril TIDPRN, ibuprofen q 8 hours as directed.  Return to MAU as needed FU with OB as planned  Follow-up Information    [facility].   Contact information: [address] [place] [address] [phone]           Tawnya CrookHogan, Heather Donovan 09/12/2016, 5:19 PM

## 2016-09-21 ENCOUNTER — Ambulatory Visit (INDEPENDENT_AMBULATORY_CARE_PROVIDER_SITE_OTHER): Payer: Self-pay | Admitting: Certified Nurse Midwife

## 2016-09-21 ENCOUNTER — Encounter: Payer: Self-pay | Admitting: Certified Nurse Midwife

## 2016-09-21 VITALS — BP 120/78 | HR 104 | Ht 67.0 in | Wt 214.6 lb

## 2016-09-21 DIAGNOSIS — O200 Threatened abortion: Secondary | ICD-10-CM

## 2016-09-21 NOTE — Patient Instructions (Signed)
Recurrent Pregnancy Loss Recurrent pregnancy loss is the loss of three or more pregnancies before 20 weeks of gestation. Losing three or more pregnancies in a row is rare. What are the causes? The most common cause of recurrent pregnancy loss is an abnormal number of chromosomes in the developing baby (fetus). Chromosomes are the structures inside cells that hold all your genetic material. In most cases of recurrent pregnancy loss, a missing or extra chromosome keeps a baby from developing. It may not be possible to identify which chromosome is defective. Chromosome abnormalities can be inherited, but most of the time they occur by chance. Other possible causes of recurrent pregnancy loss include:  Being born with an abnormal womb structure (septate uterus).  Having noncancerous growths in your uterus (fibroids or polyps).  Having a disease that causes scarring in your uterus (Asherman syndrome).  Having a disease that causes your blood to clot (antiphospholipid syndrome).  Having a disease that increases bleeding (thrombophilia). What increases the risk? The risk of recurrent pregnancy loss increases as your age increases. Other risk factors include:  Diabetes.  Thyroid disease.  Obesity.  Smoking.  Excessive alcohol or caffeine.  Recreational drug use. What are the signs or symptoms?  Vaginal bleeding.  Passing clots vaginally.  Abdominal pain or cramps.  Low back pain. How is this diagnosed? Your health care provider can create an image of your womb using sound waves and a computer (ultrasound) to confirm your pregnancy by 5 or 6 weeks after you conceive. Ultrasound can also confirm a pregnancy loss. Your health care provider may do a complete physical exam, including your vagina and uterus (pelvic exam), to find possible causes of recurrent pregnancy loss. Other tests may include:  Ultrasound imaging to see if the structure of your uterus is normal or if you have polyps  or fibroids.  Blood tests to see if you have a disease that causes recurrent pregnancy loss.  Blood tests to see if your blood clots normally.  Genetic testing of you and your partner. How is this treated? In many cases, there is no specific treatment. Depending on the cause, possible treatments include:  Having your eggs fertilized outside your uterus (in vitro fertilization). By doing this, a health care provider may be able to select eggs without chromosome abnormalities.  Taking a blood thinner to prevent clotting if you have antiphospholipid syndrome.  Having corrective surgery if you have an abnormality in your uterus. Follow these instructions at home: Follow all your health care provider's home instructions carefully. These may include:  Do not smoke or use recreational drugs.  Do not drink alcohol if you are pregnant.  Do not put anything in your vagina or have sex for 2 weeks after you have had a miscarriage.  If you are Rh negative and have had a miscarriage, you may need to get a shot of Rho (D) immune globulin. Ask your doctor if you need this shot.  Use birth control if you do not want to get pregnant. You can get pregnant [redacted] weeks after a miscarriage.  Get support from friends and loved ones. Miscarriage can be a sad and stressful event. Contact a health care provider if:  You have light vaginal bleeding or spotting while pregnant.  You have been trying to get pregnant without success.  You are struggling with sadness or depression after a miscarriage. Get help right away if:  You have heavy vaginal bleeding and abdominal cramps.  You have fever, chills, and severe   abdominal pain. This information is not intended to replace advice given to you by your health care provider. Make sure you discuss any questions you have with your health care provider. Document Released: 02/17/2008 Document Revised: 02/06/2016 Document Reviewed: 06/23/2013 Elsevier Interactive  Patient Education  2017 [employer] Incomplete Miscarriage A miscarriage is the sudden loss of an unborn baby (fetus) before the 20th week of pregnancy. In an incomplete miscarriage, parts of the fetus or placenta (afterbirth) remain in the body. Having a miscarriage can be an emotional experience. Talk with your health care provider about any questions you may have about miscarrying, the grieving process, and your future pregnancy plans. What are the causes?  Problems with the fetal chromosomes that make it impossible for the baby to develop normally. Problems with the baby's genes or chromosomes are most often the result of errors that occur by chance as the embryo divides and grows. The problems are not inherited from the parents.  Infection of the cervix or uterus.  Hormone problems.  Problems with the cervix, such as having an incompetent cervix. This is when the tissue in the cervix is not strong enough to hold the pregnancy.  Problems with the uterus, such as an abnormally shaped uterus, uterine fibroids, or congenital abnormalities.  Certain medical conditions.  Smoking, drinking alcohol, or taking illegal drugs.  Trauma. What are the signs or symptoms?  Vaginal bleeding or spotting, with or without cramps or pain.  Pain or cramping in the abdomen or lower back.  Passing fluid, tissue, or blood clots from the vagina. How is this diagnosed? Your health care provider will perform a physical exam. You may also have an ultrasound to confirm the miscarriage. Blood or urine tests may also be ordered. How is this treated?  Usually, a dilation and curettage (D&C) procedure is performed. During a D&C procedure, the cervix is widened (dilated) and any remaining fetal or placental tissue is gently removed from the uterus.  Antibiotic medicines are prescribed if there is an infection. Other medicines may be given to reduce the size of the uterus (contract) if there is a lot of  bleeding.  If you have Rh negative blood and your baby was Rh positive, you will need a Rho (D) immune globulin shot. This shot will protect any future baby from having Rh blood problems in future pregnancies.  You may be confined to bed rest. This means you should stay in bed and only get up to use the bathroom. Follow these instructions at home:  Rest as directed by your health care provider.  Restrict activity as directed by your health care provider. You may be allowed to continue light activity if curettage was not done but you require further treatment.  Keep track of the number of pads you use each day. Keep track of how soaked (saturated) they are. Record this information.  Do not  use tampons.  Do not douche or have sexual intercourse until approved by your health care provider.  Keep all follow-up appointments for reevaluation and continuing management.  Only take over-the-counter or prescription medicines for pain, fever, or discomfort as directed by your health care provider.  Take antibiotic medicine as directed by your health care provider. Make sure you finish it even if you start to feel better. Get help right away if:  You experience severe cramps in your stomach, back, or abdomen.  You have an unexplained temperature (make sure to record these temperatures).  You pass large clots or tissue (  save these for your health care provider to inspect).  Your bleeding increases.  You become light-headed, weak, or have fainting episodes. This information is not intended to replace advice given to you by your health care provider. Make sure you discuss any questions you have with your health care provider. Document Released: 08/31/2005 Document Revised: 02/06/2016 Document Reviewed: 03/30/2013 Elsevier Interactive Patient Education  2017 [employer].

## 2016-09-21 NOTE — Progress Notes (Signed)
GYN ENCOUNTER NOTE  Subjective:       Cheryl [name] is a 31 y.o. [phone] female is here for evaluation of first trimester vaginal bleeding.  Yesterday, she noticed brown spotting more than with wiping when using the restroom that required her to wear a panty liner.   Today, Cheryl [name] reports abdominal cramping and bright red vaginal bleeding that requires the use of pad. She is "fighting the emotions, because she is probably loosing another pregnancy".   Pt had a Beta HCG drawn at [facility] on 09/17/2016 which was positive for pregnancy. Blood type is O positive.   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   She reports a single successful term pregnancy and birth in 90 with use of 17-P. No history of lab work to check for blood disorders.   Gynecologic History Patient's last menstrual period was 08/15/2016.   EDD: 05/26/2017  Gestational Age: 62 weeks 2 days  Obstetric History OB History  Gravida Para Term Preterm AB Living  7 2 1 1 3 1   SAB TAB Ectopic Multiple Live Births  1 1 1  0 1    # Outcome Date GA Lbr Len/2nd Weight Sex Delivery Anes PTL Lv  7 Current           6 Term 11/18/13 [redacted]w[redacted]d 02:41 / 00:26 5 lb 6.4 oz (2.45 kg) F Vag-Spont None  LIV  5 SAB 10/15/12 [redacted]w[redacted]d         4 Preterm 08/01/07 [redacted]w[redacted]d    Vag-Spont   FD     Birth Comments: HELP Syndrome  3 Gravida           2 Ectopic           1 TAB               Past Medical History:  Diagnosis Date  . Anemia   . Depression   . HELLP syndrome   . Preterm labor     Past Surgical History:  Procedure Laterality Date  . WISDOM TOOTH EXTRACTION      Current Outpatient Prescriptions on File Prior to Visit  Medication Sig Dispense Refill  . cyclobenzaprine (FLEXERIL) 10 MG tablet Take 1 tablet (10 mg total) by mouth 3 (three) times daily. 60 tablet 1  . Prenatal Vit-Fe Fumarate-FA (PRENATAL MULTIVITAMIN) TABS tablet Take 1 tablet by mouth daily at 12 noon.    . Probiotic Product  (PROBIOTIC DAILY) CAPS Take 1 capsule by mouth daily.     No current facility-administered medications on file prior to visit.     Allergies  Allergen Reactions  . Amoxicillin Rash and Swelling  . Flagyl [Metronidazole] Swelling and Rash  . Latex Rash  . Penicillins Swelling and Rash    Has patient had a PCN reaction causing immediate rash, facial/tongue/throat swelling, SOB or lightheadedness with hypotension: Yes Has patient had a PCN reaction causing severe rash involving mucus membranes or skin necrosis: Yes Has patient had a PCN reaction that required hospitalization No Has patient had a PCN reaction occurring within the last 10 years: No If all of the above answers are "NO", then may proceed with Cephalosporin use.     Social History   Social History  . Marital status: Married    Spouse name: N/A  . Number of children: N/A  . Years of education: N/A   Occupational History  . Armed Officer/Security Environmental education officer  . Student    Social History Main Topics  .  Smoking status: Never Smoker  . Smokeless tobacco: Never Used  . Alcohol use No  . Drug use: No  . Sexual activity: Yes    Partners: Male    Birth control/ protection: , None   Other Topics Concern  . Not on file   Social History Narrative   Lives at home with husband.      Family History  Problem Relation Age of Onset  . Cancer Maternal Grandmother     Bladder  . Diabetes Maternal Grandmother   . Hypertension Maternal Grandmother   . Diabetes Maternal Grandfather   . Diabetes Mother   . Asthma Maternal Aunt     The following portions of the patient's history were reviewed and updated as appropriate: allergies, current medications, past family history, past medical history, past social history, past surgical history and problem list.  Review of Systems Review of Systems - History obtained from the patient General ROS: negative Psychological ROS: negative Gastrointestinal ROS:  positive for - cramping negative for - change in bowel habits or nausea/vomiting Genito-Urinary ROS: no dysuria, trouble voiding, or hematuria positive for - vaginal bleeding   Objective:   BP 120/78   Pulse (!) 104   Ht 5\' 7"  (1.702 m)   Wt 214 lb 9 oz (97.3 kg)   LMP 08/15/2016   Breastfeeding? No   BMI 33.61 kg/m   CONSTITUTIONAL: Well-developed female in no acute distress.   HENT:  Normocephalic, atraumatic.   NEUROLGIC: Alert and oriented to person, place, and time.   ABDOMEN: Soft, non distended; Non tender.  No Organomegaly.  PELVIC:  External Genitalia: Normal  Vagina: Blood present  Cervix: Closed  Uterus: Deferred  Adnexa: Deferred    Assessment:   First trimester vaginal bleeding  Threatened abortion   Plan:   1. CBC and Beta HCG  2. F/u in am to discuss need for US and/or repeat labs  3. Precautions given   Cheryl [name] Cheryl [name], CNM

## 2016-09-21 NOTE — Progress Notes (Signed)
Pt had a positive beta pregnancy test 09/17/16. LMP 08/15/16. Last pap smear 1 year ago with neg result. Started bleeding yesterday with mild cramping. Flow is light.

## 2016-09-22 ENCOUNTER — Other Ambulatory Visit: Payer: Self-pay | Admitting: Certified Nurse Midwife

## 2016-09-22 ENCOUNTER — Telehealth: Payer: Self-pay | Admitting: Certified Nurse Midwife

## 2016-09-22 LAB — CBC WITH DIFFERENTIAL/PLATELET
BASOS ABS: 0 10*3/uL (ref 0.0–0.2)
Basos: 0 %
EOS (ABSOLUTE): 0.2 10*3/uL (ref 0.0–0.4)
Eos: 2 %
Hematocrit: 38.6 % (ref 34.0–46.6)
Hemoglobin: 12.5 g/dL (ref 11.1–15.9)
IMMATURE GRANS (ABS): 0 10*3/uL (ref 0.0–0.1)
IMMATURE GRANULOCYTES: 0 %
LYMPHS: 26 %
Lymphocytes Absolute: 1.9 10*3/uL (ref 0.7–3.1)
MCH: 27.8 pg (ref 26.6–33.0)
MCHC: 32.4 g/dL (ref 31.5–35.7)
MCV: 86 fL (ref 79–97)
Monocytes Absolute: 0.5 10*3/uL (ref 0.1–0.9)
Monocytes: 7 %
NEUTROS PCT: 65 %
Neutrophils Absolute: 4.9 10*3/uL (ref 1.4–7.0)
PLATELETS: 139 10*3/uL — AB (ref 150–379)
RBC: 4.5 x10E6/uL (ref 3.77–5.28)
RDW: 14 % (ref 12.3–15.4)
WBC: 7.5 10*3/uL (ref 3.4–10.8)

## 2016-09-22 LAB — BETA HCG QUANT (REF LAB): HCG QUANT: 10 m[IU]/mL

## 2016-09-22 NOTE — Telephone Encounter (Signed)
Called pt, full name and date of birth verified. Lab results reviewed. Discussed orders for future lab work. Advised f/u with MD within next month. Recommended daily 81 mg Aspirin, PNV, and an additional 400 mcg Folic acid and early visit, if pregnancy occurs again prior to follow up.    Gunnar BullaJenkins Harmoni Jarious Lyon, CNM

## 2016-09-29 ENCOUNTER — Other Ambulatory Visit

## 2016-09-29 ENCOUNTER — Other Ambulatory Visit: Payer: Self-pay | Admitting: Certified Nurse Midwife

## 2016-10-02 LAB — TSH: TSH: 1.34 u[IU]/mL (ref 0.450–4.500)

## 2016-10-02 LAB — APTT: aPTT: 28 s (ref 24–33)

## 2016-10-02 LAB — PROTIME-INR
INR: 1 (ref 0.8–1.2)
Prothrombin Time: 10.8 s (ref 9.1–12.0)

## 2016-10-02 LAB — LUPUS ANTICOAGULANT PANEL

## 2016-10-02 LAB — CARDIOLIPIN ANTIBODIES, IGG, IGM, IGA
Anticardiolipin IgA: <9 U/mL (ref 0–11)
Anticardiolipin IgG: <9 GPL U/mL (ref 0–14)
Anticardiolipin IgM: <9 [MPL'U]/mL (ref 0–12)

## 2016-10-07 ENCOUNTER — Other Ambulatory Visit

## 2016-10-07 ENCOUNTER — Other Ambulatory Visit: Payer: Self-pay | Admitting: Obstetrics and Gynecology

## 2016-10-08 LAB — LUPUS ANTICOAGULANT PANEL
Dilute Viper Venom Time: 34.9 s (ref 0.0–47.0)
PTT Lupus Anticoagulant: 33.3 s (ref 0.0–51.9)

## 2016-10-13 LAB — CARDIOLIPIN ANTIBODIES, IGG, IGM, IGA

## 2016-10-13 LAB — PROTIME-INR

## 2016-10-13 LAB — APTT

## 2016-10-13 LAB — LUPUS ANTICOAGULANT PANEL

## 2016-10-13 LAB — TSH

## 2016-10-19 ENCOUNTER — Ambulatory Visit (INDEPENDENT_AMBULATORY_CARE_PROVIDER_SITE_OTHER): Admitting: Obstetrics and Gynecology

## 2016-10-19 ENCOUNTER — Encounter: Payer: Self-pay | Admitting: Obstetrics and Gynecology

## 2016-10-19 VITALS — BP 126/79 | HR 73 | Ht 67.0 in | Wt 210.4 lb

## 2016-10-19 DIAGNOSIS — N96 Recurrent pregnancy loss: Secondary | ICD-10-CM | POA: Diagnosis not present

## 2016-10-19 DIAGNOSIS — O039 Complete or unspecified spontaneous abortion without complication: Secondary | ICD-10-CM

## 2016-10-19 NOTE — Progress Notes (Signed)
HPI:      Cheryl [name] is a 31 y.o. [id number] who LMP was Patient's last menstrual period was 08/15/2016.  Subjective: She presents today  After having a complete miscarriage. She is currently not bleeding. 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 She has undergone a workup for antiphospholipid and this was negative. She reports that she has never had chromosomes performed. She is desirous of additional children.    Hx: The following portions of the patient's history were reviewed and updated as appropriate:           She  has a past medical history of Anemia; Depression; HELLP syndrome; and Preterm labor. She  does not have any pertinent problems on file.        ROS: Constitutional: Denied constitutional symptoms, night sweats, recent illness, fatigue, fever, insomnia and weight loss.  Eyes: Denied eye symptoms, eye pain, photophobia, vision change and visual disturbance.  Ears/Nose/Throat/Neck: Denied ear, nose, throat or neck symptoms, hearing loss, nasal discharge, sinus congestion and sore throat.  Cardiovascular: Denied cardiovascular symptoms, arrhythmia, chest pain/pressure, edema, exercise intolerance, orthopnea and palpitations.  Respiratory: Denied pulmonary symptoms, asthma, pleuritic pain, productive sputum, cough, dyspnea and wheezing.  Gastrointestinal: Denied, gastro-esophageal reflux, melena, nausea and vomiting.  Genitourinary: Denied genitourinary symptoms including symptomatic vaginal discharge, pelvic relaxation issues, and urinary complaints.  Musculoskeletal: Denied musculoskeletal symptoms, stiffness, swelling, muscle weakness and myalgia.  Dermatologic: Denied dermatology symptoms, rash and scar.  Neurologic: Denied neurology symptoms, dizziness, headache, neck pain and syncope.  Psychiatric: Denied psychiatric symptoms, anxiety and depression.  Endocrine: Denied endocrine symptoms including hot flashes and night sweats.    Meds: She currently has no medications in their medication list.  Objective: Vitals:   10/19/16 0925  BP: 126/79  Pulse: 73             Assessment: 1. Complete abortion   2. 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   We have discussed recurrent pregnancy loss in detail Including the complete workup. We have discussed the possibility of reproductive endocrinology infertility referral. As she is desirous of future pregnancies this is a possibility for her. She will consider her options and inform us of her decision.  Orders No orders of the defined types were placed in this encounter.   No orders of the defined types were placed in this encounter.       F/U  No Follow-up on file. I spent 15 minutes with this patient of which greater than 50% was spent discussing  Her pregnancy loss, her history of pregnancy losses, the ongoing workup for pregnancy loss and future possibilities for diagnosis and treatment.  Elonda Huskyavid J. Melesa Lecy, M.D. 10/19/2016 9:58 AM

## 2016-10-19 NOTE — Progress Notes (Signed)
Pt is here for a checkup after miscarriage. No bleeding noted. Decline birth control.

## 2016-11-17 NOTE — Telephone Encounter (Signed)
See "Reason for call" 

## 2021-06-06 ENCOUNTER — Ambulatory Visit
Admit: 2021-06-06 | Discharge: 2021-06-06 | Payer: TRICARE (CHAMPUS) | Attending: Student in an Organized Health Care Education/Training Program | Primary: Student in an Organized Health Care Education/Training Program

## 2021-06-06 DIAGNOSIS — R61 Generalized hyperhidrosis: Secondary | ICD-10-CM

## 2021-06-06 NOTE — Patient Instructions (Signed)
Get labs done in the morning. If you still have hoarse voice in 2 weeks, let me know for referral consideration to ENT.

## 2021-06-06 NOTE — Progress Notes (Signed)
SUBJECTIVE:   Cynthia Key is a 35 y.o. female seen for a visit regarding   Chief Complaint   Patient presents with    New Patient     Establish care-recently moved from [place] and has thyroid/hormone/cortisol level issues and chronic hoarseness and cough and fatigue        HPI:     Moved from [place] 02/2021. Was previoulsy being seen at the [place] in [place]. 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  Had HELLP syndrome 05/2020, since then has had fatigue.  She denies history of hemorrhagic shock. She has had 8 pregnancies in the past and had 2 children. Reportedly was anemic and had thyroid abnormality, was advised to try 4000 iu vitamin D. She is on plant based diet. Takes iodine supplements. On detox (colonics, cilantro pill).  She sees a holistic medicine provider who advised her to get hormones checked.  She was seen at urgent care on 03/16/2021, CBC was within normal limits, TSH normal at 1.21 on 03/16/2021.  CTA of the chest showed no evidence of PE, streak artifact in the right upper lobe pulmonary.  There is also mildly prominent left axillary lymph nodes.    Aunt died from leukemia 3 years ago.    She has had constant hoarse voice 2 weeks, she wonders if detox is causing this.  She has not been using her voice excessively.    Overall feeling 'horrible'. Every day she has significant fatigue, present since giving birth 05/2020. + Drenching night sweats every night since 2018.  Believes she gets usual amount of sleep. Doesn't snore that she is aware of, no gasping for air at night or pauses in breathing.      Past Medical History, Past Surgical History, Family history, Social History, and Medications were all reviewed with the patient today and updated as necessary.       Current Outpatient Medications   Medication Sig Dispense Refill    UNABLE TO FIND Iodoral 12.5mg  2 a day       No current  facility-administered medications for this visit.     Allergies   Allergen Reactions    Cephalexin Rash    Metronidazole Rash    Penicillins Rash     Patient Active Problem List   Diagnosis    Fatigue     Social History     Tobacco Use    Smoking status: Never    Smokeless tobacco: Never   Substance Use Topics    Alcohol use: Yes          Review of Systems   Constitutional:  Negative for chills and fever.   HENT:  Negative for congestion.    Eyes:  Negative for visual disturbance.   Respiratory:  Negative for shortness of breath.    Cardiovascular:  Negative for chest pain.   Gastrointestinal:  Positive for constipation. Negative for abdominal pain, nausea and vomiting.   Endocrine: Positive for cold intolerance.   Musculoskeletal:  Negative for arthralgias.   Neurological:  Negative for syncope and headaches.       OBJECTIVE:    Vitals:    06/06/21 1310   BP: 134/74   Pulse: 75   SpO2: 99%   Weight: 213 lb (96.6 kg)   Height: 5\' 7"  (1.702 m)        Physical Exam  Constitutional:  General: She is not in acute distress.     Appearance: Normal appearance.   HENT:      Head: Normocephalic and atraumatic.      Nose: Nose normal.      Mouth/Throat:      Mouth: Mucous membranes are moist.      Pharynx: No oropharyngeal exudate (cobble stoning).   Eyes:      Extraocular Movements: Extraocular movements intact.   Cardiovascular:      Rate and Rhythm: Normal rate and regular rhythm.      Heart sounds: Normal heart sounds.   Pulmonary:      Breath sounds: Normal breath sounds.   Chest:   Breasts:     Right: No axillary adenopathy.      Left: Axillary adenopathy (Small, mobile lymph node palpated in the left axilla) present.   Abdominal:      General: There is no distension.      Palpations: Abdomen is soft.      Tenderness: There is no abdominal tenderness. There is no guarding.   Musculoskeletal:         General: No deformity.   Lymphadenopathy:      Cervical: No cervical adenopathy.      Upper Body:      Right upper  body: No axillary adenopathy.      Left upper body: Axillary adenopathy (Small, mobile lymph node palpated in the left axilla) present.   Skin:     General: Skin is warm and dry.   Neurological:      Mental Status: She is alert. Mental status is at baseline.   Psychiatric:         Mood and Affect: Mood normal.          Medical problems and test results were reviewed with the patient today.     No results found for this or any previous visit (from the past 672 hour(s)).    ASSESSMENT and PLAN     1. Lymphadenopathy  -     US EXTREMITY LEFT NON VASC LIMITED; Future  2. Chronic fatigue  -     HIV 1/2 Ag/Ab, 4TH Generation,W Rflx Confirm; Future  -     C-Reactive Protein; Future  -     TSH; Future  -     Comprehensive Metabolic Panel; Future  -     CBC with Auto Differential; Future  -     Follicle Stimulating Hormone; Future  -     T4, Free; Future  -     Cortisol AM, Total; Future  3. Hoarseness of voice  4. Obesity (BMI 30.0-34.9)  5. Night sweats     Fatigue started after pregnancy.  We will check morning cortisol, thyroid, FSH (although she has regular menses).  No history of shock to suggest Sheehan syndrome. low suspicion for OSA, STOP-BANG is 1-2. Will perform work-up for night sweats.  There is a small mobile lymph node in the axilla, will get ultrasound.  In regards to hoarseness, this started 2 weeks ago, advised her to let us know if this persists for another 2 weeks at which time will refer her to ENT.      Return in about 4 weeks (around 07/04/2021) for for routine follow up.     [name], MD

## 2021-06-09 ENCOUNTER — Encounter

## 2021-06-09 ENCOUNTER — Encounter: Attending: Family Medicine | Primary: Student in an Organized Health Care Education/Training Program

## 2021-06-09 LAB — COMPREHENSIVE METABOLIC PANEL
ALT: 20 U/L (ref 12–65)
AST: 11 U/L — ABNORMAL LOW (ref 15–37)
Albumin/Globulin Ratio: 1.1 — ABNORMAL LOW (ref 1.2–3.5)
Albumin: 3.8 g/dL (ref 3.5–5.0)
Alk Phosphatase: 78 U/L (ref 50–136)
Anion Gap: 4 mmol/L (ref 4–13)
BUN: 6 MG/DL (ref 6–23)
CO2: 29 mmol/L (ref 21–32)
Calcium: 9.4 MG/DL (ref 8.3–10.4)
Chloride: 105 mmol/L (ref 101–110)
Creatinine: 0.8 MG/DL (ref 0.6–1.0)
GFR African American: >60 mL/min/{1.73_m2} (ref >60)
GFR Non-African American: >60 mL/min/{1.73_m2} (ref >60)
Globulin: 3.6 g/dL — ABNORMAL HIGH (ref 2.3–3.5)
Glucose: 103 mg/dL — ABNORMAL HIGH (ref 65–100)
Potassium: 3.9 mmol/L (ref 3.5–5.1)
Sodium: 138 mmol/L (ref 136–145)
Total Bilirubin: 0.3 MG/DL (ref 0.2–1.1)
Total Protein: 7.4 g/dL (ref 6.3–8.2)

## 2021-06-09 LAB — CBC WITH AUTO DIFFERENTIAL
Absolute Eos #: 0.1 10*3/uL (ref 0.0–0.8)
Absolute Immature Granulocyte: 0 10*3/uL (ref 0.0–0.5)
Absolute Lymph #: 1.6 10*3/uL (ref 0.5–4.6)
Absolute Mono #: 0.4 10*3/uL (ref 0.1–1.3)
Basophils Absolute: 0 10*3/uL (ref 0.0–0.2)
Basophils: 1 % (ref 0.0–2.0)
Eosinophils %: 2 % (ref 0.5–7.8)
Hematocrit: 38 % (ref 35.8–46.3)
Hemoglobin: 11.6 g/dL — ABNORMAL LOW (ref 11.7–15.4)
Immature Granulocytes: 0 % (ref 0.0–5.0)
Lymphocytes: 27 % (ref 13–44)
MCH: 26.7 PG (ref 26.1–32.9)
MCHC: 30.5 g/dL — ABNORMAL LOW (ref 31.4–35.0)
MCV: 87.4 FL (ref 79.6–97.8)
MPV: 12.2 FL (ref 9.4–12.3)
Monocytes: 7 % (ref 4.0–12.0)
Platelets: 235 10*3/uL (ref 150–450)
RBC: 4.35 M/uL (ref 4.05–5.2)
RDW: 13.6 % (ref 11.9–14.6)
Seg Neutrophils: 64 % (ref 43–78)
Segs Absolute: 3.8 10*3/uL (ref 1.7–8.2)
WBC: 5.9 10*3/uL (ref 4.3–11.1)
nRBC: 0 10*3/uL (ref 0.0–0.2)

## 2021-06-09 LAB — FOLLICLE STIMULATING HORMONE: FSH: 4.3 m[IU]/mL

## 2021-06-09 LAB — C-REACTIVE PROTEIN: CRP: 0.8 mg/dL (ref 0.0–0.9)

## 2021-06-09 LAB — HIV 1/2 AG/AB, 4TH GENERATION,W RFLX CONFIRM: HIV 1/2 Interp: NONREACTIVE

## 2021-06-09 LAB — TSH: TSH, 3RD GENERATION: 3.19 u[IU]/mL (ref 0.358–3.740)

## 2021-06-09 LAB — T4, FREE: T4 Free: 1.1 NG/DL (ref 0.78–1.46)

## 2021-06-10 LAB — CORTISOL AM, TOTAL: Cortisol - AM: 17.3 ug/dL (ref 7–25)

## 2021-06-13 ENCOUNTER — Inpatient Hospital Stay: Admit: 2021-06-13 | Payer: TRICARE (CHAMPUS) | Primary: Student in an Organized Health Care Education/Training Program

## 2021-07-03 ENCOUNTER — Telehealth
Admit: 2021-07-03 | Discharge: 2021-07-03 | Payer: TRICARE (CHAMPUS) | Attending: Student in an Organized Health Care Education/Training Program | Primary: Student in an Organized Health Care Education/Training Program

## 2021-07-03 DIAGNOSIS — F32A Depression, unspecified: Secondary | ICD-10-CM

## 2021-07-03 MED ORDER — TRAZODONE HCL 50 MG PO TABS
50 MG | ORAL_TABLET | Freq: Every evening | ORAL | 1 refills | Status: DC | PRN
Start: 2021-07-03 — End: 2021-07-25

## 2021-07-03 NOTE — Progress Notes (Signed)
Cynthia Key (DOB:  Nov 23, 1985) is seen via virtual visit today for evaluation of the following:   Chief Complaint   Patient presents with    Constipation     Since giving birth bowel movements have changed. Not going often    Sleep Problem    Referral - General     Patient is wanting to discuss PTSD after giving birth.   .      HPI:    She endorses anxiety and depression since delivery 05/2020. She has loss of motivation.  PHQ-9 of 12.  She is interested in counseling.  Having low libido since delivery, night sweats.    Anxiety attacks: Last panic attack 3-4 weeks ago.    Insomnia: No difficulty falling asleep, however doesn't feel like she has a restorative night's sleep.  Melatonin not helping.    Hoarse voice has resolved. Ultrasound of left axilla showed normal-appearing lymph nodes.    Constipation since giving birth, maybe 1 BM per day maybe 2 but doesn't feel like she is fully eliminating. Unless she drinks lots of celery juice or beet juice. Takes psylium husk a few times a week which seems to help. Tried miralax in the past but doesn't help as much as   Senna side tablet. No blood in stool, dark tarry stool, unexplained weight loss.    Review of Systems   Constitutional:  Negative for chills and fever.   HENT:  Negative for congestion.    Eyes:  Negative for visual disturbance.   Respiratory:  Negative for shortness of breath.    Cardiovascular:  Negative for chest pain.   Gastrointestinal:  Negative for abdominal pain, nausea and vomiting.   Musculoskeletal:  Negative for arthralgias.   Neurological:  Negative for syncope and headaches.          Objective     Patient-Reported Vitals  No data recorded     Physical Exam  Constitutional:       General: She is not in acute distress.     Appearance: Normal appearance.   HENT:      Head: Normocephalic and atraumatic.   Eyes:      Extraocular Movements: Extraocular movements intact.   Musculoskeletal:         General: Normal range of motion.   Skin:      General: Skin is dry.   Neurological:      General: No focal deficit present.      Mental Status: She is alert.   Psychiatric:         Mood and Affect: Mood normal.               Assessment/Plan:    1. Depression, unspecified depression type  2. Primary insomnia  -     traZODone (DESYREL) 50 MG tablet; Take 1 tablet by mouth nightly as needed for Sleep, Disp-30 tablet, R-1Normal  3. Anxiety  4. Panic attacks     She is having issues with depression, anxiety, sleep. Will try PRN trazodone at night. Refer for counseling. Etiology of chronic night sweats, low libido still unclear. Mild anemia noted, otherwise labs WNL.    Return in about 6 weeks (around 08/14/2021) for routine follow up.       [name], was evaluated through a synchronous (real-time) audio-video encounter. The patient (or guardian if applicable) is aware that this is a billable service, which includes applicable co-pays. This Virtual Visit was conducted with patient's (and/or legal guardian's) consent. The  visit was conducted pursuant to the emergency declaration under the [employer] and the [employer], 1135 waiver authority and the [employer] and [employer] Act.  Patient identification was verified, and a caregiver was present when appropriate.   The patient was located at Home: [address]  [place] [address].   Provider was located at [facility] (Appt Dept): [address]  [place],  [place] [address].        Total time spent for this encounter:  14 minutes    --[name], MD on 07/03/2021 at 7:29 PM    An electronic signature was used to authenticate this note.

## 2021-07-08 ENCOUNTER — Encounter

## 2021-07-08 MED ORDER — ZOLPIDEM TARTRATE 5 MG PO TABS
5 MG | ORAL_TABLET | Freq: Every evening | ORAL | 0 refills | Status: AC | PRN
Start: 2021-07-08 — End: 2021-07-22

## 2021-07-14 ENCOUNTER — Encounter

## 2021-07-14 LAB — VITAMIN B12: Vitamin B-12: 727 pg/mL (ref 193–986)

## 2021-07-14 LAB — VITAMIN D 25 HYDROXY: Vit D, 25-Hydroxy: 25.6 ng/mL — ABNORMAL LOW (ref 30.0–100.0)

## 2021-07-14 LAB — ESTRADIOL: Estradiol: 66.24 pg/mL

## 2021-07-15 ENCOUNTER — Encounter
Payer: TRICARE (CHAMPUS) | Attending: Student in an Organized Health Care Education/Training Program | Primary: Student in an Organized Health Care Education/Training Program

## 2021-07-16 LAB — TESTOSTERONE TOTAL ONLY, MALE: Testosterone: 13 ng/dL (ref 8–60)

## 2021-07-23 NOTE — Telephone Encounter (Signed)
Patient states she was seen by [facility] for fungal rash and given Clotrimazole 1% cream to be applied BID daily. Report has been experiencing night sweats which she believes is causing the rashes. States no relief or improvements with prescribed cream. Patient has been scheduled for an OV on Friday 07/25/21 to be re-evaluated for rash.   States has not heard from anyone in regard to her Endocrinology referral placed 07/17/21. The patient was given the address and telephone of the Endocrine office and instruct to contact them in the morning to schedule an appointment.

## 2021-07-23 NOTE — Telephone Encounter (Signed)
-----   Message from [name], MD sent at 07/23/2021 11:00 AM EST -----  Regarding: RE: Recent ER Visit  Which ER did she go to? I don't see records of this on our EMR. I can see her in office for the rash if she would like, if it is fungal infection and cream is not working there is oral antifungal. In order to place dermatology referral, she needs to be seen so we can code the problem appropriately.    ----- Message -----  From: Philbert Riser, MA  Sent: 07/23/2021  10:36 AM EST  To: [name], MD  Subject: Annell Greening: Recent ER Visit                                ----- Message -----  From: [name]  Sent: 07/23/2021   8:37 AM EST  To: , #  Subject: Recent ER Visit                                  Hi, Tanya Nones here again???  I am writing to update Dr Garry Heater. On November 5th I made a trip to the emergency room. For some time I have been having itching in my groin area but wasn???t sure what it was but just put it off because it was mild i thought my skin was just dry. I woke up on the 5th after having another drenching night sweat and the itching was way worst than ever before. So bad I needed to go to the ER to see if there was truly a problem. The doctor who saw me said I have Tinea Cruris or a groin fungal infection. He says it looks like it has been there for at least 3 months. I told him about my night sweats and he???s almost positive thats what caused it.   ###I am requesting a referral to a dermatologist at this point. Ive spoken to Dr Garry Heater about my night sweats but I honestly don???t see this healing with the cream that was prescribed by the ER doctor because i find myself laying in sweat every night until i wake up freezing. The itching is so bad at this point!  I am also still waiting on a call from the Endocrinologist???s office (I was told a referral was put it) to see if they can help me understand why I am sweating so bad at night.    Please call me with any questions. I really need to see an  Endocrinologist and Dermatologist asap.    Lastly, what are the office hours? Online says 8am-5pm but when i call it days you all are closed and to please call back during business hours. I called around 8:15am this morning.

## 2021-07-25 ENCOUNTER — Ambulatory Visit
Admit: 2021-07-25 | Discharge: 2021-07-25 | Payer: TRICARE (CHAMPUS) | Attending: Student in an Organized Health Care Education/Training Program | Primary: Student in an Organized Health Care Education/Training Program

## 2021-07-25 ENCOUNTER — Encounter

## 2021-07-25 DIAGNOSIS — B356 Tinea cruris: Secondary | ICD-10-CM

## 2021-07-25 MED ORDER — FLUCONAZOLE 150 MG PO TABS
150 MG | ORAL_TABLET | ORAL | 0 refills | Status: AC
Start: 2021-07-25 — End: 2021-08-08

## 2021-07-25 MED ORDER — ZOLPIDEM TARTRATE 5 MG PO TABS
5 MG | ORAL_TABLET | Freq: Every evening | ORAL | 0 refills | Status: AC | PRN
Start: 2021-07-25 — End: 2021-08-24

## 2021-07-25 NOTE — Progress Notes (Signed)
FOLLOW UP VISIT    Subjective:    Cynthia Key (DOB: 1986-05-25) is a 35 y.o., female,   Chief Complaint   Patient presents with    Follow-Up from Hospital     Vaginal rash times 2 weeks       HPI:    Seen in the ED for groin rash ongoing for 2 weeks: slightly better with fungal cream prescribed at the ED.     Night sweats: For 4 years. Tested for TB, lyme before and was negative. Lab workup so far has been negative. She saw hematology in the past for HELLP but not for night sweats.    Would like refill on ambien.    LMP: 07/12/21    \\The following portions of the patient's history were reviewed and updated as appropriate:      Patient Active Problem List   Diagnosis    Fatigue    Night sweats    Obesity (BMI 30.0-34.9)    Tinea cruris    Primary insomnia    Low vitamin D level       Past Surgical History:   Procedure Laterality Date    VAGINAL BIRTH AFTER CESAREAN SECTION      8 pregnancies and one still birth and one child now is 1       Family History   Problem Relation Age of Onset    Diabetes Mother     No Known Problems Father        Social History     Socioeconomic History    Marital status: Married     Spouse name: Not on file    Number of children: Not on file    Years of education: Not on file    Highest education level: Not on file   Occupational History    Not on file   Tobacco Use    Smoking status: Never    Smokeless tobacco: Never   Vaping Use    Vaping Use: Never used   Substance and Sexual Activity    Alcohol use: Yes    Drug use: Never    Sexual activity: Yes     Partners: Male   Other Topics Concern    Not on file   Social History Narrative    Not on file     Social Determinants of Health     Financial Resource Strain: Low Risk     Difficulty of Paying Living Expenses: Not hard at all   Food Insecurity: No Food Insecurity    Worried About Programme researcher, broadcasting/film/video in the Last Year: Never true    Barista in the Last Year: Never true   Transportation Needs: Not on file   Physical Activity:  Not on file   Stress: Not on file   Social Connections: Not on file   Intimate Partner Violence: Not on file   Housing Stability: Not on file       Current Outpatient Medications   Medication Sig Dispense Refill    clotrimazole (LOTRIMIN) 1 % cream APPLY TOPICALLY 2 (TWO) TIMES A DAY      zolpidem (AMBIEN) 5 MG tablet Take 1 tablet by mouth nightly as needed for Sleep for up to 30 days. 30 tablet 0    fluconazole (DIFLUCAN) 150 MG tablet Take 1 tablet by mouth once a week for 14 days 2 tablet 0    UNABLE TO FIND Iodoral 12.5mg  2 a day  No current facility-administered medications for this visit.       Allergies as of 07/25/2021 - Fully Reviewed 07/25/2021   Allergen Reaction Noted    Cephalexin Rash 03/16/2021    Metronidazole Rash 03/16/2021    Penicillins Rash 03/16/2021       Review of Systems   Constitutional:  Negative for chills and fever.   HENT:  Negative for congestion.    Eyes:  Negative for visual disturbance.   Respiratory:  Negative for shortness of breath.    Cardiovascular:  Negative for chest pain.   Gastrointestinal:  Negative for abdominal pain, nausea and vomiting.   Musculoskeletal:  Negative for arthralgias.   Neurological:  Negative for syncope and headaches.     Objective:    Vitals:    07/25/21 1129   BP: 120/62   Site: Left Upper Arm   Position: Sitting   Cuff Size: Large Adult   Pulse: 84   SpO2: 97%   Weight: 215 lb (97.5 kg)   Height: 5\' 7"  (1.702 m)        Physical Exam  Constitutional:       General: She is not in acute distress.     Appearance: Normal appearance.   HENT:      Head: Normocephalic and atraumatic.   Eyes:      Extraocular Movements: Extraocular movements intact.      Conjunctiva/sclera: Conjunctivae normal.   Cardiovascular:      Rate and Rhythm: Normal rate and regular rhythm.      Heart sounds: No murmur heard.  Pulmonary:      Effort: Pulmonary effort is normal.      Breath sounds: Normal breath sounds. No wheezing, rhonchi or rales.   Genitourinary:     Comments:  Hyperpigmented skin in the goin  Skin:     General: Skin is warm and dry.   Neurological:      Mental Status: She is alert. Mental status is at baseline.   Psychiatric:         Mood and Affect: Mood normal.         Behavior: Behavior normal.       Recent Results (from the past 672 hour(s))   Vitamin B12    Collection Time: 07/14/21  8:18 AM   Result Value Ref Range    Vitamin B-12 727 193 - 986 pg/mL   Vitamin D 25 Hydroxy    Collection Time: 07/14/21  8:18 AM   Result Value Ref Range    Vit D, 25-Hydroxy 25.6 (L) 30.0 - 100.0 ng/mL   Testosterone Total Only, Female    Collection Time: 07/14/21  8:18 AM   Result Value Ref Range    Testosterone 13 8 - 60 ng/dL   Estradiol    Collection Time: 07/14/21  8:18 AM   Result Value Ref Range    Estradiol 66.24 pg/mL       Assessent & Plan    1. Tinea cruris  2. Primary insomnia  -     zolpidem (AMBIEN) 5 MG tablet; Take 1 tablet by mouth nightly as needed for Sleep for up to 30 days., Disp-30 tablet, R-0Normal  3. Night sweats  -     QuantiFERON In Tube; Future  -     Urinalysis with Reflex to Culture; Future  4. Low vitamin D level     Prescribed diflucan.   For night sweats, will test for TB, get UA. Will consider abdominopelvic imaging.  The patient and/or patient representative voiced understanding and agreement with the current diagnoses, recommendations, and possible side effects.    Return for as scheduled.     Sharrell Ku, MD

## 2021-07-26 LAB — URINALYSIS WITH REFLEX TO CULTURE
Bilirubin Urine: NEGATIVE
Blood, Urine: NEGATIVE
Casts: 0 /lpf
Crystals: 0 /LPF
Glucose, UA: NEGATIVE mg/dL
Ketones, Urine: NEGATIVE mg/dL
Leukocyte Esterase, Urine: NEGATIVE
Mucus, UA: 0 /lpf
Nitrite, Urine: NEGATIVE
Protein, UA: NEGATIVE mg/dL
Specific Gravity, UA: 1.011 (ref 1.001–1.023)
Urobilinogen, Urine: 0.2 EU/dL (ref 0.2–1.0)
pH, Urine: 6.5 (ref 5.0–9.0)

## 2021-07-31 ENCOUNTER — Encounter
Payer: TRICARE (CHAMPUS) | Attending: Student in an Organized Health Care Education/Training Program | Primary: Student in an Organized Health Care Education/Training Program

## 2021-07-31 LAB — QUANTIFERON REFLEX
QUANTIFERON TB1 AG: 0.05 IU/mL
QUANTIFERON TB2 AG: 0.05 IU/mL
QuantiFERON Mitogen Value: >10 IU/mL
QuantiFERON Nil Value: 0.04 IU/mL

## 2021-07-31 LAB — QUANTIFERON IN TUBE: QUANTIFERON PLUS: NEGATIVE

## 2021-08-05 NOTE — Addendum Note (Signed)
Addended by: [name] on: 08/05/2021 12:36 PM     Modules accepted: Orders

## 2021-08-05 NOTE — Telephone Encounter (Signed)
Patient called in regarding her intense itching that she is still experiencing. Please see messages below

## 2021-08-08 ENCOUNTER — Telehealth
Admit: 2021-08-08 | Discharge: 2021-08-08 | Payer: TRICARE (CHAMPUS) | Attending: Student in an Organized Health Care Education/Training Program | Primary: Student in an Organized Health Care Education/Training Program

## 2021-08-08 ENCOUNTER — Encounter

## 2021-08-08 DIAGNOSIS — B356 Tinea cruris: Secondary | ICD-10-CM

## 2021-08-08 LAB — COMPREHENSIVE METABOLIC PANEL
ALT: 20 U/L (ref 12–65)
AST: 10 U/L — ABNORMAL LOW (ref 15–37)
Albumin/Globulin Ratio: 1.1 (ref 0.4–1.6)
Albumin: 3.7 g/dL (ref 3.5–5.0)
Alk Phosphatase: 82 U/L (ref 50–136)
Anion Gap: 5 mmol/L (ref 2–11)
BUN: 7 MG/DL (ref 6–23)
CO2: 27 mmol/L (ref 21–32)
Calcium: 9.2 MG/DL (ref 8.3–10.4)
Chloride: 108 mmol/L (ref 101–110)
Creatinine: 1 MG/DL (ref 0.6–1.0)
Est, Glom Filt Rate: >60 mL/min/{1.73_m2} (ref >60)
Globulin: 3.5 g/dL (ref 2.8–4.5)
Glucose: 104 mg/dL — ABNORMAL HIGH (ref 65–100)
Potassium: 4.5 mmol/L (ref 3.5–5.1)
Sodium: 140 mmol/L (ref 133–143)
Total Bilirubin: 0.3 MG/DL (ref 0.2–1.1)
Total Protein: 7.2 g/dL (ref 6.3–8.2)

## 2021-08-08 LAB — MAGNESIUM: Magnesium: 2.1 mg/dL (ref 1.8–2.4)

## 2021-08-08 MED ORDER — ZEASORB-AF 2 % EX POWD
2 % | CUTANEOUS | 0 refills | Status: AC
Start: 2021-08-08 — End: 2022-01-09

## 2021-08-08 NOTE — Progress Notes (Signed)
Cynthia Key (DOB:  [birth year]) is seen via virtual visit today for evaluation of the following:   Chief Complaint   Patient presents with    Follow-up   .      HPI:    Seen during video visit to discuss persistent itching in the inguinal folds.  Benadryl does not help.  She completed a course of oral fluconazole and noted to have palpitations after each dose, this was 2 pills once a week for 2 weeks.  Patient she reports she was diagnosed with an arrhythmia years ago, cannot recall the name.  During the day the swelling and itching is not as bad  No more palpitations.    Review of Systems   Respiratory:  Negative for shortness of breath.    Cardiovascular:  Negative for chest pain.          Objective     Patient-Reported Vitals  No data recorded     Physical Exam  Constitutional:       General: She is not in acute distress.     Appearance: Normal appearance.   HENT:      Head: Normocephalic and atraumatic.   Eyes:      Extraocular Movements: Extraocular movements intact.   Musculoskeletal:         General: Normal range of motion.   Skin:     General: Skin is dry.   Neurological:      General: No focal deficit present.      Mental Status: She is alert.   Psychiatric:         Mood and Affect: Mood normal.             Lab Results   Component Value Date    WBC 5.9 06/09/2021    HGB 11.6 (L) 06/09/2021    HCT 38.0 06/09/2021    MCV 87.4 06/09/2021    PLT 235 06/09/2021      Lab Results   Component Value Date/Time    NA 138 06/09/2021 08:03 AM    K 3.9 06/09/2021 08:03 AM    CL 105 06/09/2021 08:03 AM    CO2 29 06/09/2021 08:03 AM    BUN 6 06/09/2021 08:03 AM    CREATININE 0.80 06/09/2021 08:03 AM    GLUCOSE 103 06/09/2021 08:03 AM    CALCIUM 9.4 06/09/2021 08:03 AM       Lab Results   Component Value Date    NA 138 06/09/2021    K 3.9 06/09/2021    CL 105 06/09/2021    CO2 29 06/09/2021    BUN 6 06/09/2021    CREATININE 0.80 06/09/2021    GLUCOSE 103 (H) 06/09/2021    CALCIUM 9.4 06/09/2021    PROT 7.4 06/09/2021     LABALBU 3.8 06/09/2021    BILITOT 0.3 06/09/2021    ALKPHOS 78 06/09/2021    AST 11 (L) 06/09/2021    ALT 20 06/09/2021    LABGLOM >60 06/09/2021    GFRAA >60 06/09/2021    GLOB 3.6 (H) 06/09/2021     Lab Results   Component Value Date    TSH3GEN 3.190 06/09/2021      No results found for: CHOL  No results found for: TRIG  No results found for: HDL  No results found for: LDLCHOLESTEROL, LDLCALC  No results found for: LABVLDL, VLDL  No results found for: CHOLHDLRATIO   No results found for: LABA1C     Assessment/Plan:    1.  Cardiac arrhythmia, unspecified cardiac arrhythmia type  -     Comprehensive Metabolic Panel; Future  -     Magnesium; Future  -     EKG 12 Lead; Future     No follow-ups on file.     After virtual visit she came in and EKG obtained, was normal, QTC normal.  She did not want groin checked because she is on her period  Persistent itching of the inguinal folds could be related to skin reaction from topical cream versus persistent tinea infection.  We will prescribe miconazole powder, advised for the patient to use absorbent pad/gauze in the groin to absorb sweat at night and keep the area dry.  Check liver function while we are doing oral antifungals.  May consider oral    [name], was evaluated through a synchronous (real-time) audio-video encounter. The patient (or guardian if applicable) is aware that this is a billable service, which includes applicable co-pays. This Virtual Visit was conducted with patient's (and/or legal guardian's) consent. The visit was conducted pursuant to the emergency declaration under the [employer] and the [employer], 1135 waiver authority and the [employer] Act.  Patient identification was verified, and a caregiver was present when appropriate.   The patient was located at Home: [address]  [place] [address].   Provider was located at [facility] [employer] [facility] (Appt Dept): [address]  [place],  [place] [address].        Total time spent for this encounter:  9 minutes    --[name], MD on 08/08/2021 at 5:14 PM    An electronic signature was used to authenticate this note.

## 2021-08-11 NOTE — Progress Notes (Signed)
New Patient Abstract    Reason for Referral: Night sweats    Referring Provider:  Sharrell Ku, MD    Primary Care Provider: Sharrell Ku, MD    Family History of Cancer/Hematologic Disorders: Family history is significant for aunt who died 3 years ago with leukemia.     Presenting Symptoms: Significant fatigue, drenching night sweats every night since 2018, cold intolerance, constipation, and left axillary adenopathy    Narrative with recent with Results/Procedures/Biopsies and Dates completed: Cynthia Key is a 35 year old black female with a history of hypothyroidism postpartum, HELLP syndrome in 2021, vitamin D deficiency, cardiac arrythmia, anxiety with panic attacks, depression, and insomnia. She recently relocated from [place] to [place], [place]. She had a vaginal delivery induced due to HELLP syndrome in the fall of 2021. She has reported significant fatigue since then. She followed up with her PCP in [place] for evaluation of persistent fatigue before moving to [facility], and she reported that she was found to be anemic and have a thyroid abnormality. She was advised to try 4000 iu of vitamin D3.     After moving to [facility], on 03/16/21 she was seen at [facility] with complaints of persistent fatigue and dyspnea on exertion. Labs drawn the same day including CBC, CMP, and TSH were unremarkable. CT pulmonary angiography was ordered and completed on 03/27/21. Evaluation of the right upper lobe pulmonary arterial vasculature was limited secondary to streak artifact. There was no definite evidence of pulmonary embolism within the remaining pulmonary arterial vasculature, and mildly prominent left axillary lymph nodes measuring up to 10 mm in short axis diameter, nonspecific and possibly reactive, were seen. Patient was informed that CTPA results were reassuring, and lymph nodes were likely reactive. She was advised to research adrenal fatigue and depression as possible causes of her chronic fatigue.     She  established primary care with Dr. Dorrene German on 07/03/21. She reported overall feeling ???horrible???. She reported significant fatigue since giving birth to her last child in September of 2021. She believes she gets a usual amount of sleep, and she doesn't snore, gasp for air at night, or stop breathing that she is aware of. She also reports drenching night sweats every night since 2018 and a 2-week history of a constant hoarse voice. She is on a plant-based diet and exercises regularly. She takes iodine supplements. She does colonic detox with cilantro pill.  She sees a holistic medicine provider who advised her to get her hormones checked. Physical exam identified mildly prominent left axillary lymph nodes. Labs drawn on 06/09/21 in anticipation of her appointment were significant for HGB 11.6, MCHC 30.5, glucose 103, albumin/globulin ratio 1.1, globulin 3.6, and AST 11. Ultrasound of the left axillary was ordered and performed on 06/13/21 showing no evidence of lymphadenopathy.     Additional labs drawn on 07/14/21 included vitamin B12, estradiol, and testosterone which were all within normal limits. Vitamin D was low at 25.6. Patient reported that she has been tested in the past for TB and Lyme and was negative. She was seen by Hematology for HELLP syndrome during pregnancy but was never evaluated for night sweats.      Cynthia Key is now referred to [facility], per primary care, for hematology evaluation and treatment of a 4-year history of drenching night sweats.     CT PULMONARY ANGIOGRAPHY (CTPA) 03/27/21  CTPA FINDINGS:   PULMONARY ARTERIES: Evaluation of the right upper lobe pulmonary arterial vasculature is limited secondary to streak artifact.  Otherwise no definite evidence of pulmonary embolism.   RV/LV Ratio: RV/LV ratio ?? 0.9 (Normal)   PULMONARY PARENCHYMA and PLEURA: No definite evidence of acute consolidation. No evidence of pneumothorax or pleural effusion.   HEART and AORTA: Heart size is normal. No  evidence of pericardial effusion.   HILA and MEDIASTINUM: Mildly increased soft tissue attenuation within the superior anterior mediastinum, presumably residual thymic tissue. No significant mediastinal adenopathy. Mildly prominent left axillary lymph nodes measuring up to 10 mm in short axis diameter, nonspecific and possibly reactive.     SOFT TISSUES, SKELETAL and UPPER ABDOMEN: No acute osseous abnormality. Soft tissues are unremarkable.   IMPRESSION: Evaluation of the right upper lobe pulmonary arterial vasculature is limited secondary to streak artifact. No definite evidence of pulmonary embolism within the remaining pulmonary arterial vasculature. Mildly prominent left axillary lymph nodes measuring up to 10 mm in short axis diameter, nonspecific and possibly reactive. Recommend clinical follow-up. Additional findings as detailed above.     US EXTREMITY LEFT NON VASC LIMITED 06/13/21  FINDINGS: Limited grayscale and color Doppler imaging was performed of the left axilla. Multiple normal-appearing lymph nodes are identified with thin cortices and preserved fatty hila. There is no evidence of pathological lymphadenopathy.  IMPRESSION: No evidence of lymphadenopathy.    CBC 03/16/21         06/09/21 08:03 07/14/21 08:18   WBC 5.9    RBC 4.35    Hemoglobin Quant 11.6 (L)    Hematocrit 38.0    MCV 87.4    MCH 26.7    MCHC 30.5 (L)    MPV 12.2    RDW 13.6    Platelet Count 235    Absolute Mono # 0.4    Eosinophils % 2    Basophils Absolute 0.0    Differential Type AUTOMATED    Seg Neutrophils 64    Segs Absolute 3.8    Lymphocytes 27    Absolute Lymph # 1.6    Monocytes 7    Absolute Eos # 0.1    Basophils 1    Immature Granulocytes 0    Nucleated Red Blood Cells 0.00    Absolute Immature Granulocyte 0.0    Vitamin B-12  727     BMET WITH IONIZED CALCIUM 03/16/21      TSH 03/16/21       06/09/21 08:03 07/14/21 08:18 08/08/21 10:47   Sodium 138  140   Potassium 3.9  4.5   Chloride 105  108   CO2 29  27   BUN,BUNPL 6  7    Creatinine 0.80  1.00   Anion Gap 4  5   Est, Glom Filt Rate   >60   GFR Non-African American >60     GFR African American >60     Magnesium   2.1   Glucose, Random 103 (H)  104 (H)   CALCIUM, SERUM, 500694 9.4  9.2   ALBUMIN/GLOBULIN RATIO 1.1 (L)  1.1   Total Protein 7.4  7.2   Vitamin B-12  727    CRP 0.8     Albumin 3.8  3.7   Globulin 3.6 (H)  3.5   Alk Phosphatase 78  82   ALT 20  20   AST 11 (L)  10 (L)   Bilirubin 0.3  0.3   CORTISOL - AM 17.3     FSH 4.3     Estradiol  66.24    Testosterone  13    T4 Free  1.1     TSH, 3RD GENERATION 3.190     Vit D, 25-Hydroxy  25.6 (L)    VITAMIN B12  Rpt      Notes from Referring Provider: None    Other Pertinent Information: None    Presented at Tumor Board: N/A

## 2021-08-12 ENCOUNTER — Ambulatory Visit
Admit: 2021-08-12 | Discharge: 2021-08-12 | Payer: TRICARE (CHAMPUS) | Attending: Pediatric Hematology-Oncology | Primary: Student in an Organized Health Care Education/Training Program

## 2021-08-12 ENCOUNTER — Inpatient Hospital Stay: Admit: 2021-08-12 | Payer: TRICARE (CHAMPUS) | Primary: Student in an Organized Health Care Education/Training Program

## 2021-08-12 ENCOUNTER — Encounter

## 2021-08-12 DIAGNOSIS — R61 Generalized hyperhidrosis: Secondary | ICD-10-CM

## 2021-08-12 DIAGNOSIS — R5383 Other fatigue: Secondary | ICD-10-CM

## 2021-08-12 DIAGNOSIS — R768 Other specified abnormal immunological findings in serum: Secondary | ICD-10-CM

## 2021-08-12 LAB — CBC WITH AUTO DIFFERENTIAL
Absolute Eos #: 0.1 10*3/uL (ref 0.0–0.8)
Absolute Immature Granulocyte: 0 10*3/uL (ref 0.0–0.5)
Absolute Lymph #: 1.7 10*3/uL (ref 0.5–4.6)
Absolute Mono #: 0.4 10*3/uL (ref 0.1–1.3)
Basophils Absolute: 0 10*3/uL (ref 0.0–0.2)
Basophils: 0 % (ref 0.0–2.0)
Eosinophils %: 2 % (ref 0.5–7.8)
Hematocrit: 36.5 % (ref 35.8–46.3)
Hemoglobin: 11.2 g/dL — ABNORMAL LOW (ref 11.7–15.4)
Immature Granulocytes: 0 % (ref 0.0–5.0)
Lymphocytes: 35 % (ref 13–44)
MCH: 26.2 PG (ref 26.1–32.9)
MCHC: 30.7 g/dL — ABNORMAL LOW (ref 31.4–35.0)
MCV: 85.3 FL (ref 82.0–102.0)
MPV: 11.5 FL (ref 9.4–12.3)
Monocytes: 8 % (ref 4.0–12.0)
Platelets: 193 10*3/uL (ref 150–450)
RBC: 4.28 M/uL (ref 4.05–5.2)
RDW: 14.1 % (ref 11.9–14.6)
Seg Neutrophils: 55 % (ref 43–78)
Segs Absolute: 2.6 10*3/uL (ref 1.7–8.2)
WBC: 4.9 10*3/uL (ref 4.3–11.1)
nRBC: 0 10*3/uL (ref 0.0–0.2)

## 2021-08-12 LAB — URINALYSIS
Bilirubin Urine: NEGATIVE
Glucose, UA: NEGATIVE mg/dL
Ketones, Urine: NEGATIVE mg/dL
Leukocyte Esterase, Urine: NEGATIVE
Nitrite, Urine: NEGATIVE
Protein, UA: NEGATIVE mg/dL
Specific Gravity, UA: <=1.005 (ref 1.001–1.023)
Urobilinogen, Urine: 0.2 EU/dL (ref 0.2–1.0)
pH, Urine: 6 (ref 5.0–9.0)

## 2021-08-12 LAB — COMPREHENSIVE METABOLIC PANEL
ALT: 21 U/L (ref 12–65)
AST: 14 U/L — ABNORMAL LOW (ref 15–37)
Albumin/Globulin Ratio: 1 (ref 0.4–1.6)
Albumin: 3.9 g/dL (ref 3.5–5.0)
Alk Phosphatase: 94 U/L (ref 50–136)
Anion Gap: 5 mmol/L (ref 2–11)
BUN: 7 MG/DL (ref 6–23)
CO2: 27 mmol/L (ref 21–32)
Calcium: 8.9 MG/DL (ref 8.3–10.4)
Chloride: 105 mmol/L (ref 101–110)
Creatinine: 0.9 MG/DL (ref 0.6–1.0)
Est, Glom Filt Rate: >60 mL/min/{1.73_m2} (ref >60)
Globulin: 3.8 g/dL (ref 2.8–4.5)
Glucose: 91 mg/dL (ref 65–100)
Potassium: 3.6 mmol/L (ref 3.5–5.1)
Sodium: 137 mmol/L (ref 133–143)
Total Bilirubin: 0.3 MG/DL (ref 0.2–1.1)
Total Protein: 7.7 g/dL (ref 6.3–8.2)

## 2021-08-12 LAB — URINALYSIS, MICRO
BACTERIA, URINE: 0 /hpf
Casts: 0 /lpf
Crystals: 0 /LPF
Mucus, UA: 0 /lpf
WBC, UA: 0 /hpf

## 2021-08-12 LAB — RETICULOCYTES
Absolute Retic #: 0.0582 M/ul (ref 0.026–0.095)
Immature Retic Fraction: 18.8 % — ABNORMAL HIGH (ref 3.0–15.9)
Retic Hemoglobin conc.: 30 pg (ref 29–35)
Reticulocyte Count,Automated: 1.4 % (ref 0.3–2.0)

## 2021-08-12 LAB — VITAMIN B12: Vitamin B-12: 681 pg/mL (ref 193–986)

## 2021-08-12 LAB — PHOSPHORUS: Phosphorus: 3.5 MG/DL (ref 2.5–4.5)

## 2021-08-12 LAB — LACTATE DEHYDROGENASE: LD: 167 U/L (ref 100–190)

## 2021-08-12 LAB — C-REACTIVE PROTEIN: CRP: 0.7 mg/dL (ref 0.0–0.9)

## 2021-08-12 LAB — TRANSFERRIN SATURATION
Iron: 30 ug/dL — ABNORMAL LOW (ref 35–150)
TIBC: 367 ug/dL (ref 250–450)
TRANSFERRIN SATURATION: 8 % — ABNORMAL LOW (ref >20)

## 2021-08-12 LAB — CEA: CEA: 0.5 ng/mL (ref 0.0–3.0)

## 2021-08-12 LAB — SEDIMENTATION RATE: Sed Rate, Automated: 15 mm/hr (ref 0–20)

## 2021-08-12 LAB — TSH: TSH, 3RD GENERATION: 1.49 u[IU]/mL (ref 0.358–3)

## 2021-08-12 LAB — FOLATE: Folate: 13.6 ng/mL (ref 3.1–17.5)

## 2021-08-12 LAB — FERRITIN: Ferritin: 19 NG/ML (ref 8–388)

## 2021-08-12 LAB — T4, FREE: T4 Free: 1 NG/DL (ref 0.78–1.4)

## 2021-08-12 LAB — URIC ACID: Uric Acid: 4.6 MG/DL (ref 2.6–6.0)

## 2021-08-12 LAB — PERIPHERAL BLOOD SMEAR, MD REVIEW

## 2021-08-12 LAB — DIRECT ANTIGLOBULIN TEST: Polyspecific Coombs: NEGATIVE

## 2021-08-12 NOTE — Progress Notes (Signed)
HISTORY OF PRESENT ILLNESS  Cynthia Key is a 35 y.o. y.o. female with night sweats    ABSTRACT  New Patient Abstract    Reason for Referral: Night sweats    Referring Provider:  Sharrell Ku, MD    Primary Care Provider: Sharrell Ku, MD    Family History of Cancer/Hematologic Disorders: Family history is significant for aunt who died 3 years ago with leukemia.     Presenting Symptoms: Significant fatigue, drenching night sweats every night since 2018, cold intolerance, constipation, and left axillary adenopathy    Narrative with recent with Results/Procedures/Biopsies and Dates completed: Cynthia Key is a 35 year old black female with a history of hypothyroidism postpartum, HELLP syndrome in 2021, vitamin D deficiency, cardiac arrythmia, anxiety with panic attacks, depression, and insomnia. She recently relocated from [place] to [place], [place]. She had a vaginal delivery induced due to HELLP syndrome in the fall of 2021. She has reported significant fatigue since then. She followed up with her PCP in [place] for evaluation of persistent fatigue before moving to [facility], and she reported that she was found to be anemic and have a thyroid abnormality. She was advised to try 4000 iu of vitamin D3.     After moving to [facility], on 03/16/21 she was seen at [facility] with complaints of persistent fatigue and dyspnea on exertion. Labs drawn the same day including CBC, CMP, and TSH were unremarkable. CT pulmonary angiography was ordered and completed on 03/27/21. Evaluation of the right upper lobe pulmonary arterial vasculature was limited secondary to streak artifact. There was no definite evidence of pulmonary embolism within the remaining pulmonary arterial vasculature, and mildly prominent left axillary lymph nodes measuring up to 10 mm in short axis diameter, nonspecific and possibly reactive, were seen. Patient was informed that CTPA results were reassuring, and lymph nodes were likely reactive. She was advised to research  adrenal fatigue and depression as possible causes of her chronic fatigue.     She established primary care with Dr. Dorrene German on 07/03/21. She reported overall feeling ???horrible???. She reported significant fatigue since giving birth to her last child in September of 2021. She believes she gets a usual amount of sleep, and she doesn't snore, gasp for air at night, or stop breathing that she is aware of. She also reports drenching night sweats every night since 2018 and a 2-week history of a constant hoarse voice. She is on a plant-based diet and exercises regularly. She takes iodine supplements. She does colonic detox with cilantro pill.  She sees a holistic medicine provider who advised her to get her hormones checked. Physical exam identified mildly prominent left axillary lymph nodes. Labs drawn on 06/09/21 in anticipation of her appointment were significant for HGB 11.6, MCHC 30.5, glucose 103, albumin/globulin ratio 1.1, globulin 3.6, and AST 11. Ultrasound of the left axillary was ordered and performed on 06/13/21 showing no evidence of lymphadenopathy.     Additional labs drawn on 07/14/21 included vitamin B12, estradiol, and testosterone which were all within normal limits. Vitamin D was low at 25.6. Patient reported that she has been tested in the past for TB and Lyme and was negative. She was seen by Hematology for HELLP syndrome during pregnancy but was never evaluated for night sweats.      Cynthia Key is now referred to [facility], per primary care, for hematology evaluation and treatment of a 4-year history of drenching night sweats.     CT PULMONARY ANGIOGRAPHY (CTPA) 03/27/21  CTPA  FINDINGS:   PULMONARY ARTERIES: Evaluation of the right upper lobe pulmonary arterial vasculature is limited secondary to streak artifact. Otherwise no definite evidence of pulmonary embolism.   RV/LV Ratio: RV/LV ratio ?? 0.9 (Normal)   PULMONARY PARENCHYMA and PLEURA: No definite evidence of acute consolidation. No evidence of  pneumothorax or pleural effusion.   HEART and AORTA: Heart size is normal. No evidence of pericardial effusion.   HILA and MEDIASTINUM: Mildly increased soft tissue attenuation within the superior anterior mediastinum, presumably residual thymic tissue. No significant mediastinal adenopathy. Mildly prominent left axillary lymph nodes measuring up to 10 mm in short axis diameter, nonspecific and possibly reactive.     SOFT TISSUES, SKELETAL and UPPER ABDOMEN: No acute osseous abnormality. Soft tissues are unremarkable.   IMPRESSION: Evaluation of the right upper lobe pulmonary arterial vasculature is limited secondary to streak artifact. No definite evidence of pulmonary embolism within the remaining pulmonary arterial vasculature. Mildly prominent left axillary lymph nodes measuring up to 10 mm in short axis diameter, nonspecific and possibly reactive. Recommend clinical follow-up. Additional findings as detailed above.     US EXTREMITY LEFT NON VASC LIMITED 06/13/21  FINDINGS: Limited grayscale and color Doppler imaging was performed of the left axilla. Multiple normal-appearing lymph nodes are identified with thin cortices and preserved fatty hila. There is no evidence of pathological lymphadenopathy.  IMPRESSION: No evidence of lymphadenopathy.    CBC 03/16/21         06/09/21 08:03 07/14/21 08:18   WBC 5.9    RBC 4.35    Hemoglobin Quant 11.6 (L)    Hematocrit 38.0    MCV 87.4    MCH 26.7    MCHC 30.5 (L)    MPV 12.2    RDW 13.6    Platelet Count 235    Absolute Mono # 0.4    Eosinophils % 2    Basophils Absolute 0.0    Differential Type AUTOMATED    Seg Neutrophils 64    Segs Absolute 3.8    Lymphocytes 27    Absolute Lymph # 1.6    Monocytes 7    Absolute Eos # 0.1    Basophils 1    Immature Granulocytes 0    Nucleated Red Blood Cells 0.00    Absolute Immature Granulocyte 0.0    Vitamin B-12  727     BMET WITH IONIZED CALCIUM 03/16/21      TSH 03/16/21       06/09/21 08:03 07/14/21 08:18 08/08/21 10:47   Sodium 138   140   Potassium 3.9  4.5   Chloride 105  108   CO2 29  27   BUN,BUNPL 6  7   Creatinine 0.80  1.00   Anion Gap 4  5   Est, Glom Filt Rate   >60   GFR Non-African American >60     GFR African American >60     Magnesium   2.1   Glucose, Random 103 (H)  104 (H)   CALCIUM, SERUM, 500694 9.4  9.2   ALBUMIN/GLOBULIN RATIO 1.1 (L)  1.1   Total Protein 7.4  7.2   Vitamin B-12  727    CRP 0.8     Albumin 3.8  3.7   Globulin 3.6 (H)  3.5   Alk Phosphatase 78  82   ALT 20  20   AST 11 (L)  10 (L)   Bilirubin 0.3  0.3   CORTISOL - AM 17.3  FSH 4.3     Estradiol  66.24    Testosterone  13    T4 Free 1.1     TSH, 3RD GENERATION 3.190     Vit D, 25-Hydroxy  25.6 (L)    VITAMIN B12  Rpt      Notes from Referring Provider: None    Other Pertinent Information: None    Presented at Tumor Board: N/A  HPI: night sweats for 4 years, did labs and all ok  Vitd mild low    Itching in back, without rash  Sweats    H/O HELLP  -plts h/o ITP, during pregnancy and occasionally outside of pregnancy  -cold intolerance    H/o work up for lupus, dx in 2008, stillbirth at 6 months pregnancy, ICU down to 15  -HELLP    Had 2 successful pregnancy, last year had HELLP again, liver increase, BP, until 5 months  -plts did not drop too low    No steroids or treatment    No family h/o in family  No urine changes    No seizure    No sleep apnea or snoring    Sign. fatigue      Patient Denies:   Fevers-none   Night Sweats   Chills-cold intolerance   Weight Loss,    Bone Pain   Lymphadenopathy  Patient Denies:  Nose bleeds  Gum bleeds  Bruising or petechia  Bleeding with surgery  Bleeding with accidents  Transfusions  History or free bleeding or hemophilia    Current Outpatient Medications   Medication Sig    clotrimazole (LOTRIMIN) 1 % cream APPLY TOPICALLY 2 (TWO) TIMES A DAY    zolpidem (AMBIEN) 5 MG tablet Take 1 tablet by mouth nightly as needed for Sleep for up to 30 days.    UNABLE TO FIND Iodoral 12.41m 2 a day    miconazole (ZEASORB-AF) 2 % powder  Apply topically 2 times daily. (Patient not taking: Reported on 08/12/2021)     No current facility-administered medications for this visit.       No past medical history on file.    Past Surgical History:   Procedure Laterality Date    VAGINAL BIRTH AFTER CESAREAN SECTION      8 pregnancies and one still birth and one child now is 1       Family History   Problem Relation Age of Onset    Diabetes Mother     No Known Problems Father        Social History     Tobacco Use    Smoking status: Never    Smokeless tobacco: Never   Vaping Use    Vaping Use: Never used   Substance Use Topics    Alcohol use: Yes    Drug use: Never         There is no immunization history on file for this patient.    Allergies   Allergen Reactions    Cephalexin Rash    Metronidazole Rash    Penicillins Rash         Review of Systems   Review of Systems   Constitutional: Negative.    HENT: Negative.    Eyes: Negative.    Respiratory: Negative.    Cardiovascular: Negative.    Gastrointestinal: Negative.    Genitourinary: Negative.    Musculoskeletal: Negative.    Skin: Negative.    Neurological: Negative.    Endo/Heme/Allergies: Negative.    Psychiatric/Behavioral: Negative.  Pain reviewed fully and addressed this visit  Med review and reconciliation addressed fully this visit  ADLs and performance level addressed, ECOG 0 unless otherwise addressed    Blood pressure 117/78, pulse 71, temperature 98.7 ??F (37.1 ??C), temperature source Oral, resp. rate 16, height '5\' 7"'  (1.702 m), weight 215 lb 12.8 oz (97.9 kg), SpO2 99 %.        Physical Exam:   Constitutional: Well developed, well nourished female in no acute distress, sitting comfortably   HEENT: Normocephalic and atraumatic. Oropharynx is clear, mucous membranes are moist.  Pupils are equal, round, and reactive to light. Extraocular muscles are intact.  Sclerae anicteric. Neck supple without JVD. No thyromegaly present.    Lymph node   No palpable submandibular, cervical, supraclavicular,  axillary or inguinal lymph nodes.   Skin Warm and dry.  No bruising and no rash noted.  No erythema.  No pallor.    Respiratory Lungs are clear to auscultation bilaterally without wheezes, rales or rhonchi, normal air exchange without accessory muscle use.    CVS Normal rate, regular rhythm and normal S1 and S2.  No murmurs, gallops, or rubs.   Abdomen Soft, nontender and nondistended, normoactive bowel sounds.  No palpable mass.  No hepatosplenomegaly.   Neuro Grossly nonfocal with no obvious sensory or motor deficits.   MSK Normal range of motion in general.  No edema and no tenderness.   PS ECOG = 0   Psych Appropriate mood and affect.          No visits with results within 3 Day(s) from this visit.   Latest known visit with results is:   Orders Only on 08/08/2021   Component Date Value Ref Range Status    Magnesium 08/08/2021 2.1  1.8 - 2.4 mg/dL Final    Sodium 08/08/2021 140  133 - 143 mmol/L Final    Potassium 08/08/2021 4.5  3.5 - 5.1 mmol/L Final    Chloride 08/08/2021 108  101 - 110 mmol/L Final    CO2 08/08/2021 27  21 - 32 mmol/L Final    Anion Gap 08/08/2021 5  2 - 11 mmol/L Final    Glucose 08/08/2021 104 (A)  65 - 100 mg/dL Final    BUN 08/08/2021 7  6 - 23 MG/DL Final    Creatinine 08/08/2021 1.00  0.6 - 1.0 MG/DL Final    Est, Glom Filt Rate 08/08/2021 >60  >60 ml/min/1.46m Final    Comment:   Pediatric calculator link: https://www.kidney.org/professionals/kdoqi/gfr_calculatorped    Effective Jun 16, 2021    These results are not intended for use in patients <113years of age.    eGFR results are calculated without a race factor using  the 2021 CKD-EPI equation. Careful clinical correlation is recommended, particularly when comparing to results calculated using previous equations.  The CKD-EPI equation is less accurate in patients with extremes of muscle mass, extra-renal metabolism of creatinine, excessive creatine ingestion, or following therapy that affects renal tubular secretion.      Calcium  08/08/2021 9.2  8.3 - 10.4 MG/DL Final    Total Bilirubin 08/08/2021 0.3  0.2 - 1.1 MG/DL Final    ALT 08/08/2021 20  12 - 65 U/L Final    AST 08/08/2021 10 (A)  15 - 37 U/L Final    Alk Phosphatase 08/08/2021 82  50 - 136 U/L Final    Total Protein 08/08/2021 7.2  6.3 - 8.2 g/dL Final    Albumin 08/08/2021 3.7  3.5 -  5.0 g/dL Final    Globulin 08/08/2021 3.5  2.8 - 4.5 g/dL Final    Albumin/Globulin Ratio 08/08/2021 1.1  0.4 - 1.6   Final         Radiology:  CT Results (most recent):  No results found for this or any previous visit from the past 365 days.     PET Results (most recent):  No results found for this or any previous visit from the past 365 days.     MAM Results (most recent):  No results found for this or any previous visit from the past 365 days.    [place]  No results found for this or any previous visit from the past 365 days.         Patient Active Problem List   Diagnosis    Fatigue    Night sweats    Obesity (BMI 30.0-34.9)    Tinea cruris    Primary insomnia    Low vitamin D level         ASSESSMENT and PLAN  35 yo with 4 yr history of night sweats and a remote and recent episodes of HELLP and intermittent ITP per pt. Reports (mainly during pregnancy did not require specific therapeutic intervention) referred for eval of NS  -history and exam not supportive of lymphoproliferative or myeloproliferative neoplasm, chronicity without evolution, no other constitutional symptoms, prior work up (cta chest) and base labs  -myeloma work up pending, tryptase and chromogranin, 5HIAA even though no associated flushing/sugar issues  -inflammatory, autoimmune, infectious work up pending  -MHAS pending [id number] unlikely though  -abd [place] and CXR for completeness    OSA likeliest cause despite denial symptoms, sleep study ordered  Follow up 6 months  reassurance  Total time 70 min 50% in direct consultation about the patient's diagnosis and management  Crissie Figures, MD  Director, Adolescent Young Adult [place] and [facility] Program  [place] [address]  [facility] Phone [phone]

## 2021-08-12 NOTE — Patient Instructions (Addendum)
Patient Instructions from Today's Visit    Reason for Visit:  New patient visit     Hx still birth delivery 2008  Hx: HELLP 2008 and 2021  Hx: ITP (Initial diagnosis during pregnancy 2008)    Symptoms: drenching night sweats since 2018, back itching, cold intolerance    Plan:    Your platelets are normal and your hemoglobin is nearly normal (mildly low).  Your white blood cell count is normal.   Your liver function tests look good today.     Drenching night sweats for the duration of 4 years is not typically consistent with an oncologic diagnosis due to the longevity without progressive/new symptoms.     Additional labs today and a 24hr urines x2     Abdominal Ultrasound. A radiology scheduler will call you in the next few days to set up your scan. If you do not hear from them, call the radiology scheduling line: [phone].    Chest Xray - ask for this to be done when you have your abdominal ultrasound or your can go today as discussed.     Sleep study     Follow Up:  6 month     Recent Lab Results:  Hospital Outpatient Visit on 08/12/2021   Component Date Value Ref Range Status    T4 Free 08/12/2021 1.0  0.78 - 1.4 NG/DL Final    Color, UA 08/12/2021 YELLOW    Final    Appearance 08/12/2021 CLEAR    Final    Specific Gravity, UA 08/12/2021 <=1.005  1.001 - 1.023 Final    pH, Urine 08/12/2021 6.0  5.0 - 9.0   Final    Protein, UA 08/12/2021 Negative  NEG mg/dL Final    Glucose, UA 08/12/2021 Negative  NEG mg/dL Final    Ketones, Urine 08/12/2021 Negative  NEG mg/dL Final    Bilirubin Urine 08/12/2021 Negative  NEG   Final    Blood, Urine 08/12/2021 TRACE (A)  NEG   Final    Urobilinogen, Urine 08/12/2021 0.2  0.2 - 1.0 EU/dL Final    Nitrite, Urine 08/12/2021 Negative  NEG   Final    Leukocyte Esterase, Urine 08/12/2021 Negative  NEG   Final    Uric Acid 08/12/2021 4.6  2.6 - 6.0 MG/DL Final    LD 08/12/2021 167  100 - 190 U/L Final    Phosphorus 08/12/2021 3.5  2.5 - 4.5 MG/DL Final    Sed Rate, Automated  08/12/2021 15  0 - 20 mm/hr Final    Ferritin 08/12/2021 19  8 - 388 NG/ML Final    Iron 08/12/2021 30 (A)  35 - 150 ug/dL Final    Comment: Known Interfering Substances section:  "Iron values may be falsely elevated in  serum samples from patients with  anticoagulants (e.g., hemodialysis patients)."  Limitations of Procedure section:  "Turbidity resulting from precipitation of  fibrinogen in the serum of patients treated  with anticoagulants (e.g. hemodialysis  patients) may cause spuriously elevated  iron results."      TIBC 08/12/2021 367  250 - 450 ug/dL Final    TRANSFERRIN SATURATION 08/12/2021 8 (A)  >20 % Final    Reticulocyte Count,Automated 08/12/2021 1.4  0.3 - 2.0 % Final    Absolute Retic # 08/12/2021 0.0582  0.026 - 0.095 M/ul Final    Immature Retic Fraction 08/12/2021 18.8 (A)  3.0 - 15.9 % Final    Retic Hemoglobin conc. 08/12/2021 30  29 - 35 pg Final  Sodium 08/12/2021 137  133 - 143 mmol/L Final    Potassium 08/12/2021 3.6  3.5 - 5.1 mmol/L Final    Chloride 08/12/2021 105  101 - 110 mmol/L Final    CO2 08/12/2021 27  21 - 32 mmol/L Final    Anion Gap 08/12/2021 5  2 - 11 mmol/L Final    Glucose 08/12/2021 91  65 - 100 mg/dL Final    BUN 08/12/2021 7  6 - 23 MG/DL Final    Creatinine 08/12/2021 0.90  0.6 - 1.0 MG/DL Final    Est, Glom Filt Rate 08/12/2021 >60  >60 ml/min/1.41m Final    Comment:      Pediatric calculator link: https://www.kidney.org/professionals/kdoqi/gfr_calculatorped       Effective Jun 16, 2021       These results are not intended for use in patients <189years of age.       eGFR results are calculated without a race factor using  the 2021 CKD-EPI equation. Careful clinical correlation is recommended, particularly when comparing to results calculated using previous equations.  The CKD-EPI equation is less accurate in patients with extremes of muscle mass, extra-renal metabolism of creatinine, excessive creatine ingestion, or following therapy that affects renal tubular  secretion.      Calcium 08/12/2021 8.9  8.3 - 10.4 MG/DL Final    Total Bilirubin 08/12/2021 0.3  0.2 - 1.1 MG/DL Final    ALT 08/12/2021 21  12 - 65 U/L Final    AST 08/12/2021 14 (A)  15 - 37 U/L Final    Alk Phosphatase 08/12/2021 94  50 - 136 U/L Final    Total Protein 08/12/2021 7.7  6.3 - 8.2 g/dL Final    Albumin 08/12/2021 3.9  3.5 - 5.0 g/dL Final    Globulin 08/12/2021 3.8  2.8 - 4.5 g/dL Final    Albumin/Globulin Ratio 08/12/2021 1.0  0.4 - 1.6   Final    WBC 08/12/2021 4.9  4.3 - 11.1 K/uL Final    RBC 08/12/2021 4.28  4.05 - 5.2 M/uL Final    Hemoglobin 08/12/2021 11.2 (A)  11.7 - 15.4 g/dL Final    Hematocrit 08/12/2021 36.5  35.8 - 46.3 % Final    MCV 08/12/2021 85.3  82.0 - 102.0 FL Final    MCH 08/12/2021 26.2  26.1 - 32.9 PG Final    MCHC 08/12/2021 30.7 (A)  31.4 - 35.0 g/dL Final    RDW 08/12/2021 14.1  11.9 - 14.6 % Final    Platelets 08/12/2021 193  150 - 450 K/uL Final    MPV 08/12/2021 11.5  9.4 - 12.3 FL Final    nRBC 08/12/2021 0.00  0.0 - 0.2 K/uL Final    **Note: Absolute NRBC parameter is now reported with Hemogram**    Seg Neutrophils 08/12/2021 55  43 - 78 % Final    Lymphocytes 08/12/2021 35  13 - 44 % Final    Monocytes 08/12/2021 8  4.0 - 12.0 % Final    Eosinophils % 08/12/2021 2  0.5 - 7.8 % Final    Basophils 08/12/2021 0  0.0 - 2.0 % Final    Immature Granulocytes 08/12/2021 0  0.0 - 5.0 % Final    Segs Absolute 08/12/2021 2.6  1.7 - 8.2 K/UL Final    Absolute Lymph # 08/12/2021 1.7  0.5 - 4.6 K/UL Final    Absolute Mono # 08/12/2021 0.4  0.1 - 1.3 K/UL Final    Absolute Eos # 08/12/2021 0.1  0.0 - 0.8 K/UL Final    Basophils Absolute 08/12/2021 0.0  0.0 - 0.2 K/UL Final    Absolute Immature Granulocyte 08/12/2021 0.0  0.0 - 0.5 K/UL Final    Differential Type 08/12/2021 AUTOMATED    Final    CEA 08/12/2021 0.5  0.0 - 3.0 ng/mL Final    Comment: Nonsmoker:  <3.0 ng/mL  Smoker:     <5.0 ng/mL  [employer]. Patient's results of tumor marker testing may not be  comparable to labs using different manufacturers/methods.      WBC, UA 08/12/2021 0  0 /hpf Final    RBC, UA 08/12/2021 0-3  0 /hpf Final    Epithelial Cells UA 08/12/2021 0-3  0 /hpf Final    BACTERIA, URINE 08/12/2021 0  0 /hpf Final    Casts 08/12/2021 0  0 /lpf Final    Crystals 08/12/2021 0  0 /LPF Final    Mucus, UA 08/12/2021 0  0 /lpf Final         Treatment Summary has been discussed and given to patient: NA        -------------------------------------------------------------------------------------------------------------------  Please call our office at [phone] if you have any  of the following symptoms:   Fever of 100.5 or greater  Chills  Shortness of breath  Swelling or pain in one leg    After office hours an answering service is available and will contact a provider for emergencies or if you are experiencing any of the above symptoms.    Patient has My Chart.  My Chart log in information explained on the after visit summary printout at the [place] desk.

## 2021-08-12 NOTE — Progress Notes (Signed)
Copper level was not released so was not drawn by lab. Lab unable to add on to labs labs previously drawn.   Called pt to arrange. No answer. Left voicemail asking for pt to call or send mychart message when Abd [place] was scheduled so that we could arrange lab only same day for convenience.

## 2021-08-13 LAB — HEPATITIS PANEL, ACUTE
Hep A IgM: NONREACTIVE
Hep B Core Ab, IgM: NONREACTIVE
Hepatitis B Surface Ag: NONREACTIVE
Hepatitis C Ab: NONREACTIVE

## 2021-08-13 LAB — RHEUMATOID FACTOR: Rheumatoid Factor: NEGATIVE

## 2021-08-13 LAB — CHROMOGRANIN A: Chromogranin A: 47.9 ng/mL (ref 0.0–101.8)

## 2021-08-13 LAB — KAPPA/LAMBDA QUANTITATIVE FREE LIGHT CHAINS, SERUM
Free Kappa Light Chains: 23.8 mg/L — ABNORMAL HIGH (ref 3.3–19.4)
Free Lambda Light Chains: 10.9 mg/L (ref 5.7–26.3)
K/L RATIO: 2.18 — ABNORMAL HIGH (ref 0.26–1.65)

## 2021-08-13 LAB — HIV 1/2 AG/AB, 4TH GENERATION,W RFLX CONFIRM: HIV 1/2 Interp: NONREACTIVE

## 2021-08-13 LAB — SOLUBLE TRANSFERRIN RECEPTOR: Soluble Transferrin Recept: 22.3 nmol/L (ref 12.2–27.3)

## 2021-08-13 LAB — ANA, DIRECT, W/REFLEX: ANA: NEGATIVE

## 2021-08-13 LAB — TRYPTASE: Tryptase: 8.7 ug/L (ref 2.2–13.2)

## 2021-08-14 ENCOUNTER — Encounter
Payer: TRICARE (CHAMPUS) | Attending: Student in an Organized Health Care Education/Training Program | Primary: Student in an Organized Health Care Education/Training Program

## 2021-08-14 ENCOUNTER — Other Ambulatory Visit: Payer: TRICARE (CHAMPUS) | Primary: Student in an Organized Health Care Education/Training Program

## 2021-08-14 ENCOUNTER — Telehealth

## 2021-08-14 LAB — HEMOGLOBINOPATHY EVALUATION
Hemoglobin A/Hemoglobin Total: 97.9 % (ref 96.4–98.8)
Hemoglobin A2: 2.1 % (ref 1.8–3.2)
Hemoglobin F: 0 % (ref 0.0–2.0)
Hemoglobin S: 0 %

## 2021-08-14 NOTE — Telephone Encounter (Signed)
Patient was scheduled for virtual visit today however this was erroneous. Called and discussed pruritis of inguinal folds which is still present despite powder. Will refer to dermatology at this point.

## 2021-08-14 NOTE — Addendum Note (Signed)
Addended by: Epifanio Lesches on: 08/14/2021 04:40 PM     Modules accepted: Orders

## 2021-08-15 ENCOUNTER — Other Ambulatory Visit: Payer: TRICARE (CHAMPUS) | Primary: Student in an Organized Health Care Education/Training Program

## 2021-08-15 DIAGNOSIS — R5383 Other fatigue: Secondary | ICD-10-CM

## 2021-08-15 LAB — IFE AND PE, SERUM
A/G Ratio: 1.1 (ref 0.7–1.7)
Albumin: 3.7 g/dL (ref 2.9–4.4)
Alpha 1 Globulin: 0.2 g/dL (ref 0.0–0.4)
Alpha 2 Globulin: 0.9 g/dL (ref 0.4–1.0)
BETA GLOBULIN: 1.1 g/dL (ref 0.7–1.3)
GAMMA GLOBULIN: 1.4 g/dL (ref 0.4–1.8)
Globulin: 3.6 g/dL (ref 2.2–3.9)
IgA: 212 mg/dL (ref 87–352)
IgG, Serum: 1445 mg/dL (ref 586–1602)
IgM: 75 mg/dL (ref 26–217)
Total Protein: 7.3 g/dL (ref 6.0–8.5)

## 2021-08-18 ENCOUNTER — Inpatient Hospital Stay: Admit: 2021-08-18 | Payer: TRICARE (CHAMPUS) | Primary: Student in an Organized Health Care Education/Training Program

## 2021-08-18 DIAGNOSIS — R61 Generalized hyperhidrosis: Secondary | ICD-10-CM

## 2021-08-18 LAB — IFE+PROTEIN ELECTROPHORESIS, 24 HOUR URINE
Albumin, 24 Hr Urine: 0 %
Alpha-1-Globulin, U: 0 %
Alpha-2-Globulin, U: 0 %
Beta Globulin, U: 0 %
Collection Duration: 24 hr
Gamma Globulin, U: 0 %
Protein, 24H Urine: 89 mg/24 hr (ref 30–150)
Protein, Total Urine: 5.9 mg/dL
Volume, (UVOL): 1500 mL

## 2021-08-19 LAB — ADAMTS13 ACTIVITY: ADAMTS13 Activity: 83.7 % (ref >66.8)

## 2021-08-19 LAB — ADAMTS13 COMMENT

## 2021-08-21 ENCOUNTER — Inpatient Hospital Stay: Admit: 2021-08-21 | Payer: TRICARE (CHAMPUS) | Primary: Student in an Organized Health Care Education/Training Program

## 2021-08-21 DIAGNOSIS — R61 Generalized hyperhidrosis: Secondary | ICD-10-CM

## 2021-08-21 DIAGNOSIS — O1420 HELLP syndrome (HELLP), unspecified trimester: Secondary | ICD-10-CM

## 2021-08-21 LAB — 5 HIAA, URINE
5-HIAA, 24H Ur: 5 mg/24 hr (ref 0.0–14.9)
5-HIAA, Ur: 3.3 mg/L
Collection Duration: 24 hr
Volume, (UVOL): 1500 mL

## 2021-08-21 LAB — COPPER, SERUM: Copper: 117 ug/dL (ref 80–158)

## 2021-09-03 NOTE — Telephone Encounter (Signed)
Tinea Cruris

## 2021-09-25 NOTE — Telephone Encounter (Signed)
I didn't know who to send this to in office to help with this pt response

## 2021-10-14 ENCOUNTER — Encounter
Payer: TRICARE (CHAMPUS) | Attending: Acute Care | Primary: Student in an Organized Health Care Education/Training Program

## 2021-10-14 NOTE — Progress Notes (Deleted)
[facility]  [address]  [place], [place] [address]  [phone]    Patient Name:  Cynthia Key  Date of Birth:  April 08, 1986      Office Visit 10/10/2021    CHIEF COMPLAINT:    No chief complaint on file.      HISTORY OF PRESENT ILLNESS:      The patient presents in outpatient consultation at the request of Dr. Gaylan Gerold for evaluation of obstructive sleep apnea. PMH includes hypothyroidism, HELLP, anxiety, depression, intermittent ITP and obesity.     Ferritin 19      Sleepiness Scale:   No flowsheet data found.     No past medical history on file.      Patient Active Problem List   Diagnosis    Fatigue    Night sweats    Obesity (BMI 30.0-34.9)    Tinea cruris    Primary insomnia    Low vitamin D level           Past Surgical History:   Procedure Laterality Date    VAGINAL BIRTH AFTER CESAREAN SECTION      8 pregnancies and one still birth and one child now is 1         Social History     Socioeconomic History    Marital status: Married     Spouse name: Not on file    Number of children: Not on file    Years of education: Not on file    Highest education level: Not on file   Occupational History    Not on file   Tobacco Use    Smoking status: Never    Smokeless tobacco: Never   Vaping Use    Vaping Use: Never used   Substance and Sexual Activity    Alcohol use: Yes    Drug use: Never    Sexual activity: Yes     Partners: Male   Other Topics Concern    Not on file   Social History Narrative    Not on file     Social Determinants of Health     Financial Resource Strain: Low Risk     Difficulty of Paying Living Expenses: Not hard at all   Food Insecurity: No Food Insecurity    Worried About Programme researcher, broadcasting/film/video in the Last Year: Never true    Barista in the Last Year: Never true   Transportation Needs: Not on file   Physical Activity: Not on file   Stress: Not on file   Social Connections: Not on file   Intimate Partner Violence: Not on file   Housing Stability: Not  on file         Family History   Problem Relation Age of Onset    Diabetes Mother     No Known Problems Father          Allergies   Allergen Reactions    Cephalexin Rash    Metronidazole Rash    Penicillins Rash         Current Outpatient Medications   Medication Sig    miconazole (ZEASORB-AF) 2 % powder Apply topically 2 times daily. (Patient not taking: Reported on 08/12/2021)    clotrimazole (LOTRIMIN) 1 % cream APPLY TOPICALLY 2 (TWO) TIMES A DAY    UNABLE TO FIND Iodoral 12.5mg  2 a day     No current facility-administered medications for this visit.  REVIEW OF SYSTEMS:   CONSTITUTIONAL:   There is no history of fever, chills, night sweats, weight loss, weight gain, persistent fatigue, or lethargy/hypersomnolence.   EYES:   Denies problems with eye pain, erythema, blurred vision, or visual field loss.   ENTM:   Denies history of tinnitus, epistaxis, sore throat, hoarseness, or dysphonia.   LYMPH:   Denies swollen glands.   CARDIAC:   No chest pain, pressure, discomfort, palpitations, orthopnea, murmurs, or edema.   GI:   No dysphagia, heartburn reflux, nausea/vomiting, diarrhea, abdominal pain, or bleeding.   GU:   Denies history of dysuria, hematuria, polyuria, or decreased urine output.   MS:   No history of myalgias, arthralgias, bone pain, or muscle cramps.   SKIN:   No history of rashes, jaundice, cyanosis, nodules, or ulcers.   ENDO:   Negative for heat or cold intolerance.  No history of DM.   PSYCH:   Negative for anxiety, depression, insomnia, hallucinations.   NEURO:   There is no history of AMS, persistent headache, decreased level of consciousness, seizures, or motor or sensory deficits.      PHYSICAL EXAM:    There were no vitals filed for this visit.  There is no height or weight on file to calculate BMI.       GENERAL APPEARANCE:   The patient is normal weight and in no respiratory distress.   HEENT:   PERRL.  Conjunctivae unremarkable.   Nasal mucosa is without epistaxis, exudate, or  polyps.  Gums and dentition are unremarkable.  There is no oropharyngeal narrowing.  TMs are clear.   NECK/LYMPHATIC:   Symmetrical with no elevation of jugular venous pulsation.  Trachea midline. No thyroid enlargement.  No cervical adenopathy.   LUNGS:   Normal respiratory effort with symmetrical lung expansion.   Breath sounds ***.   HEART:   There is a regular rate and rhythm.  No murmur, rub, or gallop.  There is no edema in the lower extremities.   ABDOMEN:   Soft and non-tender.  No hepatosplenomegaly.  Bowel sounds are normal.     SKIN:   There are no rashes, cyanosis, jaundice, or ecchymosis present.   EXTREMITIES:   The extremities are unremarkable without clubbing, cyanosis, joint inflammation, degenerative, or ischemic change.   MUSCULOSKELETAL:   There is no abnormal tone, muscle atrophy, or abnormal movement present.   NEURO:   The patient is alert and oriented to person, place, and time.  Memory appears intact and mood is normal.  No gross sensorimotor deficits are present.      DIAGNOSTIC TESTS:  ***    ASSESSMENT:  (Medical Decision Making)     {No diagnosis found. (Refresh or delete this SmartLink)}       PLAN:         No orders of the defined types were placed in this encounter.    No orders of the defined types were placed in this encounter.         Collaborating Physician: Dr. Marland Kitchen     Over 50% of today's office visit was spent in face to face time reviewing test results, prognosis, importance of compliance, education about disease process, benefits of medications, instructions for management of acute flare-ups, and follow up plans.  Total face to face time spent with patient was *** minutes.    Rudene Anda, MA  Electronically signed

## 2021-10-28 ENCOUNTER — Telehealth
Admit: 2021-10-28 | Discharge: 2021-10-28 | Payer: TRICARE (CHAMPUS) | Attending: Student in an Organized Health Care Education/Training Program | Primary: Student in an Organized Health Care Education/Training Program

## 2021-10-28 ENCOUNTER — Encounter

## 2021-10-28 DIAGNOSIS — R5382 Chronic fatigue, unspecified: Secondary | ICD-10-CM

## 2021-10-28 NOTE — Progress Notes (Signed)
Cynthia Key (DOB:  [birth year]) is seen via virtual visit today for evaluation of the following:   Chief Complaint   Patient presents with    Sweats    Fatigue   .      HPI:    Feeling "exhausted", very fatigued, still having issues with sleep. Has been anemic before needing IV iron, could not tolerate oral iron in the past. Occasional palpitations. No hematochezia, melena, abdominal pain, nausea, vomiting, chest pain, SOB.    Noted to have small petechiae like spots on both bra strap. No new bras. She typically doesn't get this markings.  She is concerned about her platelet count.    Sleep: continues to have difficulty involving falling and staying asleep, unsure how many hours she sleeps each night.  Trazodone did not work. difficulty arranging sleep study because husband has had to leave in the [employer].  She is trying to limit use of Ambien.    Review of Systems   Constitutional:  Negative for chills and fever.   HENT:  Negative for congestion.    Eyes:  Negative for visual disturbance.   Respiratory:  Negative for shortness of breath.    Cardiovascular:  Negative for chest pain.   Gastrointestinal:  Negative for abdominal pain, nausea and vomiting.   Musculoskeletal:  Negative for arthralgias.   Neurological:  Negative for syncope and headaches.          Objective     Patient-Reported Vitals  No data recorded     Physical Exam  Constitutional:       General: She is not in acute distress.     Appearance: Normal appearance.   HENT:      Head: Normocephalic and atraumatic.   Eyes:      Extraocular Movements: Extraocular movements intact.   Musculoskeletal:         General: Normal range of motion.   Skin:     General: Skin is dry.   Neurological:      General: No focal deficit present.      Mental Status: She is alert.   Psychiatric:         Mood and Affect: Mood normal.             Lab Results   Component Value Date    WBC 6.0 10/28/2021    HGB 11.5 (L) 10/28/2021    HCT 37.9 10/28/2021    MCV 86.3 10/28/2021     PLT 221 10/28/2021      Lab Results   Component Value Date/Time    NA 137 08/12/2021 03:02 PM    K 3.6 08/12/2021 03:02 PM    CL 105 08/12/2021 03:02 PM    CO2 27 08/12/2021 03:02 PM    BUN 7 08/12/2021 03:02 PM    CREATININE 0.90 08/12/2021 03:02 PM    GLUCOSE 91 08/12/2021 03:02 PM    CALCIUM 8.9 08/12/2021 03:02 PM       Lab Results   Component Value Date    NA 137 08/12/2021    K 3.6 08/12/2021    CL 105 08/12/2021    CO2 27 08/12/2021    BUN 7 08/12/2021    CREATININE 0.90 08/12/2021    GLUCOSE 91 08/12/2021    CALCIUM 8.9 08/12/2021    PROT 7.7 08/12/2021    PROT 7.3 08/12/2021    LABALBU 3.9 08/12/2021    LABALBU 3.7 08/12/2021    BILITOT 0.3 08/12/2021    ALKPHOS 94 08/12/2021  AST 14 (L) 08/12/2021    ALT 21 08/12/2021    LABGLOM >60 08/12/2021    GFRAA >60 06/09/2021    GLOB 3.8 08/12/2021    GLOB 3.6 08/12/2021     Lab Results   Component Value Date    TSH3GEN 1.490 08/12/2021      No results found for: CHOL  No results found for: TRIG  No results found for: HDL  No results found for: LDLCHOLESTEROL, LDLCALC  No results found for: LABVLDL, VLDL  No results found for: CHOLHDLRATIO   No results found for: LABA1C     Assessment/Plan:    1. Chronic fatigue  -     CBC; Future  -     Ferritin; Future  -     Transferrin Saturation; Future  2. Anemia, unspecified type  -     CBC; Future  -     Ferritin; Future  -     Transferrin Saturation; Future     Return in about 2 months (around 12/26/2021).     Recheck iron, CBC.  Encouraged to get sleep study done.  Declines referral to sleep medicine at this time.      [name], was evaluated through a synchronous (real-time) audio-video encounter. The patient (or guardian if applicable) is aware that this is a billable service, which includes applicable co-pays. This Virtual Visit was conducted with patient's (and/or legal guardian's) consent. The visit was conducted pursuant to the emergency declaration under the [employer] and the [employer], [employer] waiver authority and the [employer] Act.  Patient identification was verified, and a caregiver was present when appropriate.   The patient was located at Home: [address]  [facility] [address]  Provider was located at Facility (Appt Dept): [address]  [place],  [place] [address]         Total time spent for this encounter:  12 minutes    --[name], MD on 10/28/2021 at 10:26 PM    An electronic signature was used to authenticate this note.

## 2021-10-29 LAB — CBC
Hematocrit: 37.9 % (ref 35.8–46.3)
Hemoglobin: 11.5 g/dL — ABNORMAL LOW (ref 11.7–15.4)
MCH: 26.2 PG (ref 26.1–32.9)
MCHC: 30.3 g/dL — ABNORMAL LOW (ref 31.4–35.0)
MCV: 86.3 FL (ref 82–102)
MPV: 12.4 FL — ABNORMAL HIGH (ref 9.4–12.3)
Platelets: 221 10*3/uL (ref 150–450)
RBC: 4.39 M/uL (ref 4.05–5.2)
RDW: 14.2 % (ref 11.9–14.6)
WBC: 6 10*3/uL (ref 4.3–11.1)
nRBC: 0 10*3/uL (ref 0.0–0.2)

## 2021-10-29 LAB — TRANSFERRIN SATURATION
Iron: 25 ug/dL — ABNORMAL LOW (ref 35–150)
TIBC: 413 ug/dL (ref 250–450)
TRANSFERRIN SATURATION: 6 % — ABNORMAL LOW (ref >20)

## 2021-10-30 NOTE — Telephone Encounter (Signed)
Patient called to make sure we received her message. Please advise, patient is anxious.

## 2021-11-04 NOTE — Telephone Encounter (Signed)
DR Deretha Emory at [facility] said that the Ferritin was not done on this pt and it is over 7 days so it can not be added on at this time.

## 2021-11-05 NOTE — Telephone Encounter (Signed)
PT needs to schedule IVIG infusion

## 2021-11-06 ENCOUNTER — Telehealth

## 2021-11-06 NOTE — Telephone Encounter (Signed)
-----   Message from Kaloko A. Cadena sent at 11/06/2021 12:34 PM EST -----  Regarding: IV Iron  Hi, Dr. Garry Heater called me yesterday and said she spoke to Dr. Joice Lofts about seeing if I would benefit from an IV iron infusion and she said Dr. Joice Lofts believes I would benefit from it. I called the office yesterday and today. Yesterday I was told someone would call me back but I never heard back (I understand, you guys are probably super busy!) also when I called today I was transferred to a voicemail and that lady called me back to say she can not schedule me I have to call the main number back. Not sure what happened there but just checking to see who I would need to speak with about receiving the IV Iron. Hey, thanks so much in advance I appreciate you guys!

## 2021-11-07 NOTE — Telephone Encounter (Signed)
Labs reviewed with Dr Joice Lofts.   Proceed with IV iron.   Responded back to pt's mychart message. Infusion scheduler aware to contact pt to schedule Injectafer (2 doses spearated by 1 week) to start in the next 1-2 weeks.

## 2021-11-17 ENCOUNTER — Inpatient Hospital Stay: Admit: 2021-11-17 | Payer: TRICARE (CHAMPUS) | Primary: Student in an Organized Health Care Education/Training Program

## 2021-11-17 DIAGNOSIS — D509 Iron deficiency anemia, unspecified: Secondary | ICD-10-CM

## 2021-11-17 MED ORDER — SODIUM CHLORIDE (PF) 0.9 % IJ SOLN
0.9 % | Freq: Once | INTRAMUSCULAR | Status: DC | PRN
Start: 2021-11-17 — End: 2021-11-18

## 2021-11-17 MED ORDER — NORMAL SALINE FLUSH 0.9 % IV SOLN
0.9 % | INTRAVENOUS | Status: DC | PRN
Start: 2021-11-17 — End: 2021-11-18
  Administered 2021-11-17: 14:00:00 10 mL via INTRAVENOUS

## 2021-11-17 MED ORDER — ACETAMINOPHEN 325 MG PO TABS
325 MG | Freq: Once | ORAL | Status: DC | PRN
Start: 2021-11-17 — End: 2021-11-18

## 2021-11-17 MED ORDER — SODIUM CHLORIDE 0.9 % IV SOLN
0.9 % | INTRAVENOUS | Status: DC | PRN
Start: 2021-11-17 — End: 2021-11-18
  Administered 2021-11-17: 14:00:00 25 mL/h via INTRAVENOUS

## 2021-11-17 MED ORDER — ALBUTEROL SULFATE HFA 108 (90 BASE) MCG/ACT IN AERS
108 (90 Base) MCG/ACT | RESPIRATORY_TRACT | Status: DC | PRN
Start: 2021-11-17 — End: 2021-11-18

## 2021-11-17 MED ORDER — DIPHENHYDRAMINE HCL 50 MG/ML IJ SOLN
50 MG/ML | Freq: Once | INTRAMUSCULAR | Status: DC | PRN
Start: 2021-11-17 — End: 2021-11-18

## 2021-11-17 MED ORDER — EPINEPHRINE PF 1 MG/ML IJ SOLN
1 MG/ML | INTRAMUSCULAR | Status: DC | PRN
Start: 2021-11-17 — End: 2021-11-18

## 2021-11-17 MED ORDER — HYDROCORTISONE SOD SUC (PF) 100 MG IJ SOLR
100 MG | Freq: Once | INTRAMUSCULAR | Status: DC | PRN
Start: 2021-11-17 — End: 2021-11-18

## 2021-11-17 MED ORDER — FERRIC CARBOXYMALTOSE 750 MG/15ML IV SOLN
75015 MG/15ML | Freq: Once | INTRAVENOUS | Status: AC
Start: 2021-11-17 — End: 2021-11-17
  Administered 2021-11-17: 14:00:00 750 mg via INTRAVENOUS

## 2021-11-17 MED ORDER — ONDANSETRON HCL 4 MG/2ML IJ SOLN
4 MG/2ML | Freq: Once | INTRAMUSCULAR | Status: DC | PRN
Start: 2021-11-17 — End: 2021-11-18

## 2021-11-17 MED FILL — INJECTAFER 750 MG/15ML IV SOLN: 750 MG/15ML | INTRAVENOUS | Qty: 15

## 2021-11-17 NOTE — Progress Notes (Signed)
Arrived to the Infusion Center. Injectafer completed. Patient tolerated well.   Any issues or concerns during appointment: none.  Patient aware of next infusion appointment on 11/24/21 (date) at 8:30 AM (time).  Patient instructed to call provider with temperature of 100.4 or greater or nausea/vomiting/diarrhea or pain not controlled by medications  Discharged ambulatory.

## 2021-11-24 ENCOUNTER — Inpatient Hospital Stay: Admit: 2021-11-24 | Payer: TRICARE (CHAMPUS) | Primary: Student in an Organized Health Care Education/Training Program

## 2021-11-24 DIAGNOSIS — D509 Iron deficiency anemia, unspecified: Secondary | ICD-10-CM

## 2021-11-24 MED ORDER — ONDANSETRON HCL 4 MG/2ML IJ SOLN
4 MG/2ML | Freq: Once | INTRAMUSCULAR | Status: DC | PRN
Start: 2021-11-24 — End: 2021-11-25

## 2021-11-24 MED ORDER — HYDROCORTISONE SOD SUC (PF) 100 MG IJ SOLR
100 MG | Freq: Once | INTRAMUSCULAR | Status: DC | PRN
Start: 2021-11-24 — End: 2021-11-25

## 2021-11-24 MED ORDER — SODIUM CHLORIDE (PF) 0.9 % IJ SOLN
0.9 % | Freq: Once | INTRAMUSCULAR | Status: DC | PRN
Start: 2021-11-24 — End: 2021-11-25

## 2021-11-24 MED ORDER — NORMAL SALINE FLUSH 0.9 % IV SOLN
0.9 % | INTRAVENOUS | Status: DC | PRN
Start: 2021-11-24 — End: 2021-11-25
  Administered 2021-11-24: 13:00:00 10 mL via INTRAVENOUS

## 2021-11-24 MED ORDER — ACETAMINOPHEN 325 MG PO TABS
325 MG | Freq: Once | ORAL | Status: DC | PRN
Start: 2021-11-24 — End: 2021-11-25

## 2021-11-24 MED ORDER — SODIUM CHLORIDE 0.9 % IV SOLN
0.9 % | Freq: Once | INTRAVENOUS | Status: AC
Start: 2021-11-24 — End: 2021-11-24
  Administered 2021-11-24: 13:00:00 750 mg via INTRAVENOUS

## 2021-11-24 MED ORDER — DIPHENHYDRAMINE HCL 50 MG/ML IJ SOLN
50 MG/ML | Freq: Once | INTRAMUSCULAR | Status: DC | PRN
Start: 2021-11-24 — End: 2021-11-25

## 2021-11-24 MED ORDER — EPINEPHRINE PF 1 MG/ML IJ SOLN
1 MG/ML | INTRAMUSCULAR | Status: DC | PRN
Start: 2021-11-24 — End: 2021-11-25

## 2021-11-24 MED ORDER — ALBUTEROL SULFATE HFA 108 (90 BASE) MCG/ACT IN AERS
108 (90 Base) MCG/ACT | RESPIRATORY_TRACT | Status: DC | PRN
Start: 2021-11-24 — End: 2021-11-25

## 2021-11-24 MED ORDER — SODIUM CHLORIDE 0.9 % IV SOLN
0.9 % | INTRAVENOUS | Status: DC | PRN
Start: 2021-11-24 — End: 2021-11-25
  Administered 2021-11-24: 13:00:00 25 mL/h via INTRAVENOUS

## 2021-11-24 MED FILL — INJECTAFER 750 MG/15ML IV SOLN: 750 MG/15ML | INTRAVENOUS | Qty: 15

## 2021-11-24 NOTE — Progress Notes (Signed)
Arrived to the Infusion Center.  Injectafer completed. Patient tolerated well.   Any issues or concerns during appointment: none.  Patient aware of next lab and [facility] office visit on 02/03/22 (date) at 8 AM (time).  Patient instructed to call provider with temperature of 100.4 or greater or nausea/vomiting/diarrhea or pain not controlled by medications  Discharged ambulatory.

## 2021-12-31 NOTE — Telephone Encounter (Signed)
Patient is scheduled for  8:00 am appt on 01/09/22 with Milana Kidney.  He will not be here until later that day. Patient is being moved to 11:30 that same day.

## 2022-01-09 ENCOUNTER — Ambulatory Visit
Admit: 2022-01-09 | Discharge: 2022-01-09 | Payer: TRICARE (CHAMPUS) | Attending: Family | Primary: Student in an Organized Health Care Education/Training Program

## 2022-01-09 DIAGNOSIS — R29818 Other symptoms and signs involving the nervous system: Secondary | ICD-10-CM

## 2022-01-09 NOTE — Patient Instructions (Signed)
Home sleep study ordered  Recommendations as above  Follow-up after sleep study or 3 months after starting CPAP therapy or sooner if needed

## 2022-01-09 NOTE — Progress Notes (Unsigned)
[facility]  [address]  [place], [place] [address]  [phone]    Patient Name:  Cynthia Key  Date of Birth:  November 21, 1985      Office Visit 01/08/2022    CHIEF COMPLAINT:    No chief complaint on file.      HISTORY OF PRESENT ILLNESS:      The patient presents in outpatient consultation at the request of Dr. Marland Kitchen for management of obstructive sleep apnea.  The diagnostic polysomnography was notable for an apnea hypopnea index of *** including ***.  Oxygen desaturations are low as ***% were noted with SpO2 less than 89% for a total of *** minutes of the test.  Significant cardiac arrhythmias were not evident.  The patient was noted to have *** limb movements with a limb movement arousal index being about ***.  A subsequent CPAP titration study was conducted.  CPAP levels as high as *** were performed.  CPAP was *** effective in eliminating disordered breathing. CPAP was tolerated *** by the patient.  The patient reports feeling *** the day following      Sleepiness Scale:   No flowsheet data found.     No past medical history on file.      Patient Active Problem List   Diagnosis    Fatigue    Night sweats    Obesity (BMI 30.0-34.9)    Tinea cruris    Primary insomnia    Low vitamin D level    Iron deficiency anemia           Past Surgical History:   Procedure Laterality Date    VAGINAL BIRTH AFTER CESAREAN SECTION      8 pregnancies and one still birth and one child now is 1         Social History     Socioeconomic History    Marital status: Married     Spouse name: Not on file    Number of children: Not on file    Years of education: Not on file    Highest education level: Not on file   Occupational History    Not on file   Tobacco Use    Smoking status: Never    Smokeless tobacco: Never   Vaping Use    Vaping Use: Never used   Substance and Sexual Activity    Alcohol use: Yes    Drug use: Never    Sexual activity: Yes     Partners: Male   Other Topics Concern    Not on file    Social History Narrative    Not on file     Social Determinants of Health     Financial Resource Strain: Low Risk     Difficulty of Paying Living Expenses: Not hard at all   Food Insecurity: No Food Insecurity    Worried About Programme researcher, broadcasting/film/video in the Last Year: Never true    Ran Out of Food in the Last Year: Never true   Transportation Needs: Not on file   Physical Activity: Not on file   Stress: Not on file   Social Connections: Not on file   Intimate Partner Violence: Not on file   Housing Stability: Not on file         Family History   Problem Relation Age of Onset    Diabetes Mother     No Known Problems Father  Allergies   Allergen Reactions    Cephalexin Rash    Metronidazole Rash    Penicillins Rash         Current Outpatient Medications   Medication Sig    miconazole (ZEASORB-AF) 2 % powder Apply topically 2 times daily. (Patient not taking: Reported on 08/12/2021)     No current facility-administered medications for this visit.           REVIEW OF SYSTEMS:   CONSTITUTIONAL:   There is no history of fever, chills, night sweats, weight loss, weight gain, persistent fatigue, or lethargy/hypersomnolence.   EYES:   Denies problems with eye pain, erythema, blurred vision, or visual field loss.   ENTM:   Denies history of tinnitus, epistaxis, sore throat, hoarseness, or dysphonia.   LYMPH:   Denies swollen glands.   CARDIAC:   No chest pain, pressure, discomfort, palpitations, orthopnea, murmurs, or edema.   GI:   No dysphagia, heartburn reflux, nausea/vomiting, diarrhea, abdominal pain, or bleeding.   GU:   Denies history of dysuria, hematuria, polyuria, or decreased urine output.   MS:   No history of myalgias, arthralgias, bone pain, or muscle cramps.   SKIN:   No history of rashes, jaundice, cyanosis, nodules, or ulcers.   ENDO:   Negative for heat or cold intolerance.  No history of DM.   PSYCH:   Negative for anxiety, depression, insomnia, hallucinations.   NEURO:   There is no history of AMS,  persistent headache, decreased level of consciousness, seizures, or motor or sensory deficits.      PHYSICAL EXAM:    There were no vitals filed for this visit.  There is no height or weight on file to calculate BMI.       GENERAL APPEARANCE:   The patient is normal weight and in no respiratory distress.   HEENT:   PERRL.  Conjunctivae unremarkable.   Nasal mucosa is without epistaxis, exudate, or polyps.  Gums and dentition are unremarkable.  There is no oropharyngeal narrowing.  TMs are clear.   NECK/LYMPHATIC:   Symmetrical with no elevation of jugular venous pulsation.  Trachea midline. No thyroid enlargement.  No cervical adenopathy.   LUNGS:   Normal respiratory effort with symmetrical lung expansion.   Breath sounds ***.   HEART:   There is a regular rate and rhythm.  No murmur, rub, or gallop.  There is no edema in the lower extremities.   ABDOMEN:   Soft and non-tender.  No hepatosplenomegaly.  Bowel sounds are normal.     SKIN:   There are no rashes, cyanosis, jaundice, or ecchymosis present.   EXTREMITIES:   The extremities are unremarkable without clubbing, cyanosis, joint inflammation, degenerative, or ischemic change.   MUSCULOSKELETAL:   There is no abnormal tone, muscle atrophy, or abnormal movement present.   NEURO:   The patient is alert and oriented to person, place, and time.  Memory appears intact and mood is normal.  No gross sensorimotor deficits are present.      DIAGNOSTIC TESTS:  ***    ASSESSMENT:  (Medical Decision Making)     {No diagnosis found. (Refresh or delete this SmartLink)}       PLAN:         No orders of the defined types were placed in this encounter.    No orders of the defined types were placed in this encounter.         Collaborating Physician: Dr. Marland Kitchen     Over  50% of today's office visit was spent in face to face time reviewing test results, prognosis, importance of compliance, education about disease process, benefits of medications, instructions for management of acute  flare-ups, and follow up plans.  Total face to face time spent with patient was *** minutes.    Isidoro Donning, MA  Electronically signed

## 2022-01-09 NOTE — Telephone Encounter (Signed)
Called pt she is ok  to change appt to 1230 pm.

## 2022-01-21 ENCOUNTER — Inpatient Hospital Stay: Admit: 2022-01-21 | Payer: TRICARE (CHAMPUS) | Primary: Student in an Organized Health Care Education/Training Program

## 2022-01-21 DIAGNOSIS — G4710 Hypersomnia, unspecified: Secondary | ICD-10-CM

## 2022-01-28 ENCOUNTER — Other Ambulatory Visit: Payer: TRICARE (CHAMPUS) | Primary: Student in an Organized Health Care Education/Training Program

## 2022-01-28 ENCOUNTER — Encounter
Payer: TRICARE (CHAMPUS) | Attending: Pediatric Hematology-Oncology | Primary: Student in an Organized Health Care Education/Training Program

## 2022-01-28 ENCOUNTER — Encounter

## 2022-02-03 ENCOUNTER — Ambulatory Visit
Admit: 2022-02-03 | Discharge: 2022-02-03 | Payer: TRICARE (CHAMPUS) | Attending: Pediatric Hematology-Oncology | Primary: Student in an Organized Health Care Education/Training Program

## 2022-02-03 ENCOUNTER — Inpatient Hospital Stay: Admit: 2022-02-03 | Payer: TRICARE (CHAMPUS) | Primary: Student in an Organized Health Care Education/Training Program

## 2022-02-03 DIAGNOSIS — R61 Generalized hyperhidrosis: Secondary | ICD-10-CM

## 2022-02-03 DIAGNOSIS — D509 Iron deficiency anemia, unspecified: Secondary | ICD-10-CM

## 2022-02-03 DIAGNOSIS — D649 Anemia, unspecified: Secondary | ICD-10-CM

## 2022-02-03 LAB — COMPREHENSIVE METABOLIC PANEL
ALT: 17 U/L (ref 12–65)
AST: 13 U/L — ABNORMAL LOW (ref 15–37)
Albumin/Globulin Ratio: 0.9 (ref 0.4–1.6)
Albumin: 3.5 g/dL (ref 3.5–5.0)
Alk Phosphatase: 82 U/L (ref 50–136)
Anion Gap: 6 mmol/L (ref 2–11)
BUN: 5 MG/DL — ABNORMAL LOW (ref 6–23)
CO2: 28 mmol/L (ref 21–32)
Calcium: 9 MG/DL (ref 8.3–10.4)
Chloride: 107 mmol/L (ref 101–110)
Creatinine: 0.8 MG/DL (ref 0.6–1.0)
Est, Glom Filt Rate: >60 mL/min/{1.73_m2} (ref >60)
Globulin: 4.1 g/dL (ref 2.8–4.5)
Glucose: 100 mg/dL (ref 65–100)
Potassium: 4.1 mmol/L (ref 3.5–5.1)
Sodium: 141 mmol/L (ref 133–143)
Total Bilirubin: 0.3 MG/DL (ref 0.2–1.1)
Total Protein: 7.6 g/dL (ref 6.3–8.2)

## 2022-02-03 LAB — CBC WITH AUTO DIFFERENTIAL
Absolute Immature Granulocyte: 0 10*3/uL (ref 0.0–0.5)
Basophils %: 1 % (ref 0.0–2.0)
Basophils Absolute: 0 10*3/uL (ref 0.0–0.2)
Eosinophils %: 1 % (ref 0.5–7.8)
Eosinophils Absolute: 0.1 10*3/uL (ref 0.0–0.8)
Hematocrit: 39.4 % (ref 35.8–46.3)
Hemoglobin: 12.2 g/dL (ref 11.7–15.4)
Immature Granulocytes: 0 % (ref 0.0–5.0)
Lymphocytes %: 33 % (ref 13–44)
Lymphocytes Absolute: 1.5 10*3/uL (ref 0.5–4.6)
MCH: 29.1 PG (ref 26.1–32.9)
MCHC: 31 g/dL — ABNORMAL LOW (ref 31.4–35.0)
MCV: 94 FL (ref 82.0–102.0)
MPV: 10.8 FL (ref 9.4–12.3)
Monocytes %: 6 % (ref 4.0–12.0)
Monocytes Absolute: 0.3 10*3/uL (ref 0.1–1.3)
Neutrophils %: 59 % (ref 43–78)
Neutrophils Absolute: 2.7 10*3/uL (ref 1.7–8.2)
Platelets: 233 10*3/uL (ref 150–450)
RBC: 4.19 M/uL (ref 4.05–5.2)
RDW: 15.7 % — ABNORMAL HIGH (ref 11.9–14.6)
WBC: 4.6 10*3/uL (ref 4.3–11.1)
nRBC: 0 10*3/uL (ref 0.0–0.2)

## 2022-02-03 LAB — TRANSFERRIN SATURATION
Iron: 57 ug/dL (ref 35–150)
TIBC: 254 ug/dL (ref 250–450)
TRANSFERRIN SATURATION: 22 % (ref >20)

## 2022-02-03 LAB — RETICULOCYTES
Absolute Retic #: 0.0696 M/ul (ref 0.026–0.095)
Immature Retic Fraction: 14.1 % (ref 3.0–15.9)
Retic Hemoglobin conc.: 36 pg — ABNORMAL HIGH (ref 29–35)
Reticulocyte Count,Automated: 1.7 % (ref 0.3–2.0)

## 2022-02-03 LAB — FERRITIN: Ferritin: 336 NG/ML (ref 8–388)

## 2022-02-03 NOTE — Progress Notes (Signed)
HISTORY OF PRESENT ILLNESS  ann is a 36 y.o. y.o. female with night sweats    ABSTRACT  New Patient Abstract    Reason for Referral: Night sweats    Referring Provider:  Sharrell Ku, MD    Primary Care Provider: Sharrell Ku, MD    Family History of Cancer/Hematologic Disorders: Family history is significant for aunt who died 3 years ago with leukemia.     Presenting Symptoms: Significant fatigue, drenching night sweats every night since 2018, cold intolerance, constipation, and left axillary adenopathy    Narrative with recent with Results/Procedures/Biopsies and Dates completed: Ms. Nims is a 36 year old black female with a history of hypothyroidism postpartum, HELLP syndrome in 2021, vitamin D deficiency, cardiac arrythmia, anxiety with panic attacks, depression, and insomnia. She recently relocated from [place] to [place], [place]. She had a vaginal delivery induced due to HELLP syndrome in the fall of 2021. She has reported significant fatigue since then. She followed up with her PCP in [place] for evaluation of persistent fatigue before moving to [facility], and she reported that she was found to be anemic and have a thyroid abnormality. She was advised to try 4000 iu of vitamin D3.     After moving to [facility], on 03/16/21 she was seen at [facility] with complaints of persistent fatigue and dyspnea on exertion. Labs drawn the same day including CBC, CMP, and TSH were unremarkable. CT pulmonary angiography was ordered and completed on 03/27/21. Evaluation of the right upper lobe pulmonary arterial vasculature was limited secondary to streak artifact. There was no definite evidence of pulmonary embolism within the remaining pulmonary arterial vasculature, and mildly prominent left axillary lymph nodes measuring up to 10 mm in short axis diameter, nonspecific and possibly reactive, were seen. Patient was informed that CTPA results were reassuring, and lymph nodes were likely reactive. She was advised to research  adrenal fatigue and depression as possible causes of her chronic fatigue.     She established primary care with Dr. Dorrene German on 07/03/21. She reported overall feeling "horrible". She reported significant fatigue since giving birth to her last child in September of 2021. She believes she gets a usual amount of sleep, and she doesn't snore, gasp for air at night, or stop breathing that she is aware of. She also reports drenching night sweats every night since 2018 and a 2-week history of a constant hoarse voice. She is on a plant-based diet and exercises regularly. She takes iodine supplements. She does colonic detox with cilantro pill.  She sees a holistic medicine provider who advised her to get her hormones checked. Physical exam identified mildly prominent left axillary lymph nodes. Labs drawn on 06/09/21 in anticipation of her appointment were significant for HGB 11.6, MCHC 30.5, glucose 103, albumin/globulin ratio 1.1, globulin 3.6, and AST 11. Ultrasound of the left axillary was ordered and performed on 06/13/21 showing no evidence of lymphadenopathy.     Additional labs drawn on 07/14/21 included vitamin B12, estradiol, and testosterone which were all within normal limits. Vitamin D was low at 25.6. Patient reported that she has been tested in the past for TB and Lyme and was negative. She was seen by Hematology for HELLP syndrome during pregnancy but was never evaluated for night sweats.      Ms. Decuir is now referred to [facility], per primary care, for hematology evaluation and treatment of a 4-year history of drenching night sweats.     CT PULMONARY ANGIOGRAPHY (CTPA) 03/27/21  CTPA  FINDINGS:   PULMONARY ARTERIES: Evaluation of the right upper lobe pulmonary arterial vasculature is limited secondary to streak artifact. Otherwise no definite evidence of pulmonary embolism.   RV/LV Ratio: RV/LV ratio ?? 0.9 (Normal)   PULMONARY PARENCHYMA and PLEURA: No definite evidence of acute consolidation. No evidence of  pneumothorax or pleural effusion.   HEART and AORTA: Heart size is normal. No evidence of pericardial effusion.   HILA and MEDIASTINUM: Mildly increased soft tissue attenuation within the superior anterior mediastinum, presumably residual thymic tissue. No significant mediastinal adenopathy. Mildly prominent left axillary lymph nodes measuring up to 10 mm in short axis diameter, nonspecific and possibly reactive.     SOFT TISSUES, SKELETAL and UPPER ABDOMEN: No acute osseous abnormality. Soft tissues are unremarkable.   IMPRESSION: Evaluation of the right upper lobe pulmonary arterial vasculature is limited secondary to streak artifact. No definite evidence of pulmonary embolism within the remaining pulmonary arterial vasculature. Mildly prominent left axillary lymph nodes measuring up to 10 mm in short axis diameter, nonspecific and possibly reactive. Recommend clinical follow-up. Additional findings as detailed above.     US EXTREMITY LEFT NON VASC LIMITED 06/13/21  FINDINGS: Limited grayscale and color Doppler imaging was performed of the left axilla. Multiple normal-appearing lymph nodes are identified with thin cortices and preserved fatty hila. There is no evidence of pathological lymphadenopathy.  IMPRESSION: No evidence of lymphadenopathy.    CBC 03/16/21         06/09/21 08:03 07/14/21 08:18   WBC 5.9    RBC 4.35    Hemoglobin Quant 11.6 (L)    Hematocrit 38.0    MCV 87.4    MCH 26.7    MCHC 30.5 (L)    MPV 12.2    RDW 13.6    Platelet Count 235    Absolute Mono # 0.4    Eosinophils % 2    Basophils Absolute 0.0    Differential Type AUTOMATED    Seg Neutrophils 64    Segs Absolute 3.8    Lymphocytes 27    Absolute Lymph # 1.6    Monocytes 7    Absolute Eos # 0.1    Basophils 1    Immature Granulocytes 0    Nucleated Red Blood Cells 0.00    Absolute Immature Granulocyte 0.0    Vitamin B-12  727     BMET WITH IONIZED CALCIUM 03/16/21      TSH 03/16/21       06/09/21 08:03 07/14/21 08:18 08/08/21 10:47   Sodium 138   140   Potassium 3.9  4.5   Chloride 105  108   CO2 29  27   BUN,BUNPL 6  7   Creatinine 0.80  1.00   Anion Gap 4  5   Est, Glom Filt Rate   >60   GFR Non-African American >60     GFR African American >60     Magnesium   2.1   Glucose, Random 103 (H)  104 (H)   CALCIUM, SERUM, 500694 9.4  9.2   ALBUMIN/GLOBULIN RATIO 1.1 (L)  1.1   Total Protein 7.4  7.2   Vitamin B-12  727    CRP 0.8     Albumin 3.8  3.7   Globulin 3.6 (H)  3.5   Alk Phosphatase 78  82   ALT 20  20   AST 11 (L)  10 (L)   Bilirubin 0.3  0.3   CORTISOL - AM 17.3  FSH 4.3     Estradiol  66.24    Testosterone  13    T4 Free 1.1     TSH, 3RD GENERATION 3.190     Vit D, 25-Hydroxy  25.6 (L)    VITAMIN B12  Rpt      Notes from Referring Provider: None    Other Pertinent Information: None    Presented at Tumor Board: N/A  HPI: night sweats for 4 years, did labs and all ok  Vitd mild low    Itching in back, without rash  Sweats    H/O HELLP  -plts h/o ITP, during pregnancy and occasionally outside of pregnancy  -cold intolerance    H/o work up for lupus, dx in 2008, stillbirth at 6 months pregnancy, ICU down to 15  -HELLP    Had 2 successful pregnancy, last year had HELLP again, liver increase, BP, until 5 months  -plts did not drop too low    No steroids or treatment    No family h/o in family  No urine changes    No seizure    No sleep apnea or snoring    Sign. fatigue      Patient Denies:   Fevers-none   Night Sweats   Chills-cold intolerance   Weight Loss,    Bone Pain   Lymphadenopathy  Patient Denies:  Nose bleeds  Gum bleeds  Bruising or petechia  Bleeding with surgery  Bleeding with accidents  Transfusions  History or free bleeding or hemophilia    No current outpatient medications on file.     No current facility-administered medications for this visit.       No past medical history on file.    Past Surgical History:   Procedure Laterality Date    VAGINAL BIRTH AFTER CESAREAN SECTION      8 pregnancies and one still birth and one child now is 1        Family History   Problem Relation Age of Onset    Diabetes Mother     No Known Problems Father        Social History     Tobacco Use    Smoking status: Never     Passive exposure: Past    Smokeless tobacco: Never   Vaping Use    Vaping Use: Never used   Substance Use Topics    Alcohol use: Yes    Drug use: Never         There is no immunization history on file for this patient.    Allergies   Allergen Reactions    Latex Itching and Rash    Amoxicillin Hives    Cephalexin Rash, Hives, Itching and Swelling     Other reaction(s): Rash-Allergy    Metronidazole Rash, Hives, Itching and Swelling     Other reaction(s): Rash-Allergy    Penicillins Rash, Hives, Itching and Swelling     Other reaction(s): Rash-Allergy         Review of Systems   Review of Systems   Constitutional: Negative.    HENT: Negative.    Eyes: Negative.    Respiratory: Negative.    Cardiovascular: Negative.    Gastrointestinal: Negative.    Genitourinary: Negative.    Musculoskeletal: Negative.    Skin: Negative.    Neurological: Negative.    Endo/Heme/Allergies: Negative.    Psychiatric/Behavioral: Negative.      Pain reviewed fully and addressed this visit  Med review and reconciliation addressed fully this  visit  ADLs and performance level addressed, ECOG 0 unless otherwise addressed    Blood pressure 128/79, pulse 74, temperature 98.4 F (36.9 C), resp. rate 14, height '5\' 7"'  (1.702 m), weight 214 lb 8 oz (97.3 kg), last menstrual period 01/30/2022, SpO2 100 %.        Physical Exam:   Constitutional: Well developed, well nourished female in no acute distress, sitting comfortably   HEENT: Normocephalic and atraumatic. Oropharynx is clear, mucous membranes are moist.  Pupils are equal, round, and reactive to light. Extraocular muscles are intact.  Sclerae anicteric. Neck supple without JVD. No thyromegaly present.    Lymph node   No palpable submandibular, cervical, supraclavicular, axillary or inguinal lymph nodes.   Skin Warm and dry.  No  bruising and no rash noted.  No erythema.  No pallor.    Respiratory Lungs are clear to auscultation bilaterally without wheezes, rales or rhonchi, normal air exchange without accessory muscle use.    CVS Normal rate, regular rhythm and normal S1 and S2.  No murmurs, gallops, or rubs.   Abdomen Soft, nontender and nondistended, normoactive bowel sounds.  No palpable mass.  No hepatosplenomegaly.   Neuro Grossly nonfocal with no obvious sensory or motor deficits.   MSK Normal range of motion in general.  No edema and no tenderness.   PS ECOG = 0   Psych Appropriate mood and affect.          Hospital Outpatient Visit on 02/03/2022   Component Date Value Ref Range Status    WBC 02/03/2022 4.6  4.3 - 11.1 K/uL Final    RBC 02/03/2022 4.19  4.05 - 5.2 M/uL Final    Hemoglobin 02/03/2022 12.2  11.7 - 15.4 g/dL Final    Hematocrit 02/03/2022 39.4  35.8 - 46.3 % Final    MCV 02/03/2022 94.0  82.0 - 102.0 FL Final    MCH 02/03/2022 29.1  26.1 - 32.9 PG Final    MCHC 02/03/2022 31.0 (L)  31.4 - 35.0 g/dL Final    RDW 02/03/2022 15.7 (H)  11.9 - 14.6 % Final    Platelets 02/03/2022 233  150 - 450 K/uL Final    MPV 02/03/2022 10.8  9.4 - 12.3 FL Final    nRBC 02/03/2022 0.00  0.0 - 0.2 K/uL Final    **Note: Absolute NRBC parameter is now reported with Hemogram**    Neutrophils % 02/03/2022 59  43 - 78 % Final    Lymphocytes % 02/03/2022 33  13 - 44 % Final    Monocytes % 02/03/2022 6  4.0 - 12.0 % Final    Eosinophils % 02/03/2022 1  0.5 - 7.8 % Final    Basophils % 02/03/2022 1  0.0 - 2.0 % Final    Immature Granulocytes 02/03/2022 0  0.0 - 5.0 % Final    Neutrophils Absolute 02/03/2022 2.7  1.7 - 8.2 K/UL Final    Lymphocytes Absolute 02/03/2022 1.5  0.5 - 4.6 K/UL Final    Monocytes Absolute 02/03/2022 0.3  0.1 - 1.3 K/UL Final    Eosinophils Absolute 02/03/2022 0.1  0.0 - 0.8 K/UL Final    Basophils Absolute 02/03/2022 0.0  0.0 - 0.2 K/UL Final    Absolute Immature Granulocyte 02/03/2022 0.0  0.0 - 0.5 K/UL Final     Differential Type 02/03/2022 AUTOMATED    Final    Reticulocyte Count,Automated 02/03/2022 1.7  0.3 - 2.0 % Final    Absolute Retic # 02/03/2022 0.0696  0.026 - 0.095 M/ul Final    Immature Retic Fraction 02/03/2022 14.1  3.0 - 15.9 % Final    Retic Hemoglobin conc. 02/03/2022 36 (H)  29 - 35 pg Final         Radiology:  CT Results (most recent):  No results found for this or any previous visit from the past 365 days.     PET Results (most recent):  No results found for this or any previous visit from the past 365 days.     MAM Results (most recent):  No results found for this or any previous visit from the past 365 days.    [place]  No results found for this or any previous visit from the past 365 days.         Patient Active Problem List   Diagnosis    Fatigue    Night sweats    Obesity (BMI 30.0-34.9)    Tinea cruris    Primary insomnia    Low vitamin D level    Iron deficiency anemia    Suspected sleep apnea    Snoring    Non-restorative sleep    Bruxism (teeth grinding)    Persistent disorder of initiating or maintaining sleep         ASSESSMENT and PLAN  36 yo with 4 yr history of night sweats and a remote and recent episodes of HELLP and intermittent ITP per pt. Reports (mainly during pregnancy did not require specific therapeutic intervention) referred for eval of NS  -history and exam not supportive of lymphoproliferative or myeloproliferative neoplasm, chronicity without evolution, no other constitutional symptoms, prior work up (cta chest) and base labs  -myeloma work up pending, tryptase and chromogranin, 5HIAA even though no associated flushing/sugar issues  -inflammatory, autoimmune, infectious work up pending  -MHAS pending [id number] unlikely though  -abd [place] and CXR for completeness    OSA likeliest cause despite denial symptoms, sleep study ordered  Follow up 6 months  Reassurance    02/03/22  Follow up for intermittent low plts mainly ass. With HELLP/pregnancy and now normal multiple times, resolved  functional IDA and mild elevated K/L ratio, repeated today  -follow up with PCP with annual screening labs  -if abnormal K/L follow up hem/onc visit 1 year or if elevated get paraprotein work up  -call if issues  Agrees with plan    Total time 67mn 50% in direct consultation about the patient's diagnosis and management  HCrissie Figures MD  Director, Adolescent Young Adult COsseoand Blood Disorders Program  [place] [address] [address] [place] Phone [phone]

## 2022-02-03 NOTE — Patient Instructions (Addendum)
Patient Instructions from Today's Visit    Reason for Visit:  Follow up for iron deficiency, fatigue, low platelets   S/p IV iron: March 2023    S/p Abdominal ultrasound and Chest Xray WNL (Dec 2022)    Plan:  Your platelets are normal.  Your hemoglobin and iron stores are normal.     Your kappa lambda ration/ free light chains were a little elevated last time. We repeated that today and it is still pending. The low level of elevation/abnormality is likely nothing  of concern.   If your free light chain level is lower, you can follow up with your primary care provider. Or you can follow up with [place] in a year  If its till elevated, we will schedule to see you back in a year.     Follow Up:  As needed only     Recent Lab Results:  Hospital Outpatient Visit on 02/03/2022   Component Date Value Ref Range Status    WBC 02/03/2022 4.6  4.3 - 11.1 K/uL Final    RBC 02/03/2022 4.19  4.05 - 5.2 M/uL Final    Hemoglobin 02/03/2022 12.2  11.7 - 15.4 g/dL Final    Hematocrit 02/03/2022 39.4  35.8 - 46.3 % Final    MCV 02/03/2022 94.0  82.0 - 102.0 FL Final    MCH 02/03/2022 29.1  26.1 - 32.9 PG Final    MCHC 02/03/2022 31.0 (L)  31.4 - 35.0 g/dL Final    RDW 02/03/2022 15.7 (H)  11.9 - 14.6 % Final    Platelets 02/03/2022 233  150 - 450 K/uL Final    MPV 02/03/2022 10.8  9.4 - 12.3 FL Final    nRBC 02/03/2022 0.00  0.0 - 0.2 K/uL Final    **Note: Absolute NRBC parameter is now reported with Hemogram**    Neutrophils % 02/03/2022 59  43 - 78 % Final    Lymphocytes % 02/03/2022 33  13 - 44 % Final    Monocytes % 02/03/2022 6  4.0 - 12.0 % Final    Eosinophils % 02/03/2022 1  0.5 - 7.8 % Final    Basophils % 02/03/2022 1  0.0 - 2.0 % Final    Immature Granulocytes 02/03/2022 0  0.0 - 5.0 % Final    Neutrophils Absolute 02/03/2022 2.7  1.7 - 8.2 K/UL Final    Lymphocytes Absolute 02/03/2022 1.5  0.5 - 4.6 K/UL Final    Monocytes Absolute 02/03/2022 0.3  0.1 - 1.3 K/UL Final    Eosinophils Absolute 02/03/2022 0.1  0.0 - 0.8 K/UL  Final    Basophils Absolute 02/03/2022 0.0  0.0 - 0.2 K/UL Final    Absolute Immature Granulocyte 02/03/2022 0.0  0.0 - 0.5 K/UL Final    Differential Type 02/03/2022 AUTOMATED    Final    Sodium 02/03/2022 141  133 - 143 mmol/L Final    Potassium 02/03/2022 4.1  3.5 - 5.1 mmol/L Final    Chloride 02/03/2022 107  101 - 110 mmol/L Final    CO2 02/03/2022 28  21 - 32 mmol/L Final    Anion Gap 02/03/2022 6  2 - 11 mmol/L Final    Glucose 02/03/2022 100  65 - 100 mg/dL Final    BUN 02/03/2022 5 (L)  6 - 23 MG/DL Final    Creatinine 02/03/2022 0.80  0.6 - 1.0 MG/DL Final    Est, Glom Filt Rate 02/03/2022 >60  >60 ml/min/1.70m Final    Comment:  Pediatric calculator link: https://www.kidney.org/professionals/kdoqi/gfr_calculatorped       These results are not intended for use in patients <61 years of age.       eGFR results are calculated without a race factor using  the 2021 CKD-EPI equation. Careful clinical correlation is recommended, particularly when comparing to results calculated using previous equations.  The CKD-EPI equation is less accurate in patients with extremes of muscle mass, extra-renal metabolism of creatinine, excessive creatine ingestion, or following therapy that affects renal tubular secretion.      Calcium 02/03/2022 9.0  8.3 - 10.4 MG/DL Final    Total Bilirubin 02/03/2022 0.3  0.2 - 1.1 MG/DL Final    ALT 02/03/2022 17  12 - 65 U/L Final    AST 02/03/2022 13 (L)  15 - 37 U/L Final    Alk Phosphatase 02/03/2022 82  50 - 136 U/L Final    Total Protein 02/03/2022 7.6  6.3 - 8.2 g/dL Final    Albumin 02/03/2022 3.5  3.5 - 5.0 g/dL Final    Globulin 02/03/2022 4.1  2.8 - 4.5 g/dL Final    Albumin/Globulin Ratio 02/03/2022 0.9  0.4 - 1.6   Final    Iron 02/03/2022 57  35 - 150 ug/dL Final    Comment: Known Interfering Substances section:  "Iron values may be falsely elevated in  serum samples from patients with  anticoagulants (e.g., hemodialysis patients)."  Limitations of Procedure  section:  "Turbidity resulting from precipitation of  fibrinogen in the serum of patients treated  with anticoagulants (e.g. hemodialysis  patients) may cause spuriously elevated  iron results."      TIBC 02/03/2022 254  250 - 450 ug/dL Final    TRANSFERRIN SATURATION 02/03/2022 22  >20 % Final    Ferritin 02/03/2022 336  8 - 388 NG/ML Final    Reticulocyte Count,Automated 02/03/2022 1.7  0.3 - 2.0 % Final    Absolute Retic # 02/03/2022 0.0696  0.026 - 0.095 M/ul Final    Immature Retic Fraction 02/03/2022 14.1  3.0 - 15.9 % Final    Retic Hemoglobin conc. 02/03/2022 36 (H)  29 - 35 pg Final         Treatment Summary has been discussed and given to patient: NA        -------------------------------------------------------------------------------------------------------------------  Please call our office at [phone] if you have any  of the following symptoms:   Fever of 100.5 or greater  Chills  Shortness of breath  Swelling or pain in one leg    After office hours an answering service is available and will contact a provider for emergencies or if you are experiencing any of the above symptoms.    Patient has My Chart.  My Chart log in information explained on the after visit summary printout at the [place] desk.

## 2022-02-04 LAB — KAPPA/LAMBDA QUANTITATIVE FREE LIGHT CHAINS, SERUM
Free Kappa Light Chains: 23.1 mg/L — ABNORMAL HIGH (ref 3.3–19.4)
Free Lambda Light Chains: 11.7 mg/L (ref 5.7–26.3)
K/L Ratio: 1.97 — ABNORMAL HIGH (ref 0.26–1.65)

## 2022-02-05 LAB — COPPER, SERUM: Copper: 135 ug/dL (ref 80–158)

## 2022-02-16 NOTE — Telephone Encounter (Signed)
Patient wants her sleep study results . Ask for someone to call

## 2022-02-17 NOTE — Telephone Encounter (Signed)
Spoke with pt in reference to her sleep study done on 01/21/22 which showed AHI of 2.5 and Sao2 91%. Pt voiced understanding

## 2022-04-22 ENCOUNTER — Encounter
Payer: TRICARE (CHAMPUS) | Attending: Student in an Organized Health Care Education/Training Program | Primary: Student in an Organized Health Care Education/Training Program

## 2022-08-17 ENCOUNTER — Encounter
Admit: 2022-08-17 | Discharge: 2022-08-17 | Payer: PRIVATE HEALTH INSURANCE | Primary: Student in an Organized Health Care Education/Training Program

## 2022-08-17 DIAGNOSIS — J45990 Exercise induced bronchospasm: Secondary | ICD-10-CM

## 2022-08-17 LAB — AMB POC SPIROMETRY W/BRONCHODILATOR
FEV 1 , POC: 3.24 L
FEV1 % Pred POC: 99 %
FEV1/FVC, POC: 88
FVC % Pred POC: 94 %
FVC, POC: 3.7

## 2022-08-17 NOTE — Progress Notes (Signed)
CPFT performed.

## 2022-11-04 ENCOUNTER — Encounter
Payer: PRIVATE HEALTH INSURANCE | Attending: Family Medicine | Primary: Student in an Organized Health Care Education/Training Program

## 2022-12-24 ENCOUNTER — Encounter
Payer: PRIVATE HEALTH INSURANCE | Attending: Family Medicine | Primary: Student in an Organized Health Care Education/Training Program

## 2023-02-10 NOTE — Telephone Encounter (Signed)
Pt would like to Cx her appt on 02/11/2023. She will call back to r/s.

## 2023-02-11 ENCOUNTER — Encounter
Payer: PRIVATE HEALTH INSURANCE | Attending: Pediatric Hematology-Oncology | Primary: Student in an Organized Health Care Education/Training Program

## 2023-02-11 ENCOUNTER — Other Ambulatory Visit: Payer: TRICARE (CHAMPUS) | Primary: Student in an Organized Health Care Education/Training Program
